# Patient Record
Sex: Male | Born: 1970 | Race: White | Hispanic: No | Marital: Married | Smoking: Former smoker
Health system: Southern US, Community
[De-identification: ages and names within clinical notes are randomized; demographics above are authoritative.]

## PROBLEM LIST (undated history)

## (undated) DIAGNOSIS — Z21 Asymptomatic human immunodeficiency virus [HIV] infection status: Secondary | ICD-10-CM

## (undated) DIAGNOSIS — B20 Human immunodeficiency virus [HIV] disease: Secondary | ICD-10-CM

## (undated) DIAGNOSIS — E781 Pure hyperglyceridemia: Secondary | ICD-10-CM

## (undated) DIAGNOSIS — I10 Essential (primary) hypertension: Secondary | ICD-10-CM

## (undated) DIAGNOSIS — IMO0002 Reserved for concepts with insufficient information to code with codable children: Secondary | ICD-10-CM

## (undated) DIAGNOSIS — E785 Hyperlipidemia, unspecified: Secondary | ICD-10-CM

## (undated) DIAGNOSIS — J329 Chronic sinusitis, unspecified: Secondary | ICD-10-CM

## (undated) DIAGNOSIS — F329 Major depressive disorder, single episode, unspecified: Secondary | ICD-10-CM

## (undated) DIAGNOSIS — A539 Syphilis, unspecified: Secondary | ICD-10-CM

## (undated) DIAGNOSIS — R7303 Prediabetes: Secondary | ICD-10-CM

## (undated) DIAGNOSIS — F3289 Other specified depressive episodes: Secondary | ICD-10-CM

## (undated) DIAGNOSIS — K219 Gastro-esophageal reflux disease without esophagitis: Secondary | ICD-10-CM

## (undated) HISTORY — DX: Syphilis, unspecified: A53.9

## (undated) HISTORY — DX: Gastro-esophageal reflux disease without esophagitis: K21.9

## (undated) HISTORY — DX: Asymptomatic human immunodeficiency virus (hiv) infection status: Z21

## (undated) HISTORY — DX: Pure hyperglyceridemia: E78.1

## (undated) HISTORY — DX: Reserved for concepts with insufficient information to code with codable children: IMO0002

## (undated) HISTORY — DX: Major depressive disorder, single episode, unspecified: F32.9

## (undated) HISTORY — DX: Hyperlipidemia, unspecified: E78.5

## (undated) HISTORY — DX: Essential (primary) hypertension: I10

## (undated) HISTORY — DX: Prediabetes: R73.03

## (undated) HISTORY — DX: Chronic sinusitis, unspecified: J32.9

## (undated) HISTORY — DX: Other specified depressive episodes: F32.89

## (undated) HISTORY — DX: Human immunodeficiency virus (HIV) disease: B20

---

## 1998-08-02 ENCOUNTER — Ambulatory Visit (HOSPITAL_BASED_OUTPATIENT_CLINIC_OR_DEPARTMENT_OTHER): Admission: RE | Admit: 1998-08-02 | Discharge: 1998-08-02 | Payer: Self-pay | Admitting: *Deleted

## 1999-01-26 ENCOUNTER — Ambulatory Visit (HOSPITAL_COMMUNITY): Admission: RE | Admit: 1999-01-26 | Discharge: 1999-01-26 | Payer: Self-pay | Admitting: Infectious Diseases

## 1999-01-26 ENCOUNTER — Encounter: Admission: RE | Admit: 1999-01-26 | Discharge: 1999-01-26 | Payer: Self-pay | Admitting: Infectious Diseases

## 1999-01-31 ENCOUNTER — Ambulatory Visit (HOSPITAL_COMMUNITY): Admission: RE | Admit: 1999-01-31 | Discharge: 1999-01-31 | Payer: Self-pay | Admitting: Infectious Diseases

## 1999-01-31 ENCOUNTER — Encounter: Payer: Self-pay | Admitting: Infectious Diseases

## 1999-02-21 ENCOUNTER — Encounter: Payer: Self-pay | Admitting: Infectious Diseases

## 1999-02-21 ENCOUNTER — Ambulatory Visit (HOSPITAL_COMMUNITY): Admission: RE | Admit: 1999-02-21 | Discharge: 1999-02-21 | Payer: Self-pay | Admitting: Infectious Diseases

## 1999-02-21 ENCOUNTER — Inpatient Hospital Stay (HOSPITAL_COMMUNITY): Admission: AD | Admit: 1999-02-21 | Discharge: 1999-02-23 | Payer: Self-pay | Admitting: Internal Medicine

## 1999-02-21 ENCOUNTER — Encounter: Admission: RE | Admit: 1999-02-21 | Discharge: 1999-02-21 | Payer: Self-pay | Admitting: Infectious Diseases

## 1999-02-21 ENCOUNTER — Encounter (INDEPENDENT_AMBULATORY_CARE_PROVIDER_SITE_OTHER): Payer: Self-pay | Admitting: *Deleted

## 1999-02-21 LAB — CONVERTED CEMR LAB: CD4 Count: 10 microliters

## 1999-02-28 ENCOUNTER — Encounter: Admission: RE | Admit: 1999-02-28 | Discharge: 1999-02-28 | Payer: Self-pay | Admitting: Infectious Diseases

## 1999-03-25 ENCOUNTER — Ambulatory Visit (HOSPITAL_COMMUNITY): Admission: RE | Admit: 1999-03-25 | Discharge: 1999-03-25 | Payer: Self-pay | Admitting: Internal Medicine

## 1999-03-25 ENCOUNTER — Encounter: Payer: Self-pay | Admitting: Internal Medicine

## 1999-03-25 ENCOUNTER — Encounter: Admission: RE | Admit: 1999-03-25 | Discharge: 1999-03-25 | Payer: Self-pay | Admitting: Internal Medicine

## 1999-03-29 ENCOUNTER — Encounter: Payer: Self-pay | Admitting: Internal Medicine

## 1999-03-29 ENCOUNTER — Inpatient Hospital Stay (HOSPITAL_COMMUNITY): Admission: AD | Admit: 1999-03-29 | Discharge: 1999-03-31 | Payer: Self-pay | Admitting: Internal Medicine

## 1999-03-29 ENCOUNTER — Encounter: Admission: RE | Admit: 1999-03-29 | Discharge: 1999-03-29 | Payer: Self-pay | Admitting: Internal Medicine

## 1999-03-30 ENCOUNTER — Encounter: Payer: Self-pay | Admitting: Infectious Diseases

## 1999-03-30 ENCOUNTER — Encounter: Payer: Self-pay | Admitting: Internal Medicine

## 1999-04-11 ENCOUNTER — Encounter: Admission: RE | Admit: 1999-04-11 | Discharge: 1999-04-11 | Payer: Self-pay | Admitting: Infectious Diseases

## 1999-04-25 ENCOUNTER — Encounter: Admission: RE | Admit: 1999-04-25 | Discharge: 1999-04-25 | Payer: Self-pay | Admitting: Infectious Diseases

## 1999-04-27 ENCOUNTER — Encounter: Admission: RE | Admit: 1999-04-27 | Discharge: 1999-04-27 | Payer: Self-pay | Admitting: Infectious Diseases

## 1999-05-02 ENCOUNTER — Encounter: Admission: RE | Admit: 1999-05-02 | Discharge: 1999-05-02 | Payer: Self-pay | Admitting: Infectious Diseases

## 1999-06-13 ENCOUNTER — Ambulatory Visit (HOSPITAL_COMMUNITY): Admission: RE | Admit: 1999-06-13 | Discharge: 1999-06-13 | Payer: Self-pay | Admitting: Infectious Diseases

## 1999-06-13 ENCOUNTER — Encounter: Admission: RE | Admit: 1999-06-13 | Discharge: 1999-06-13 | Payer: Self-pay | Admitting: Infectious Diseases

## 1999-07-04 ENCOUNTER — Encounter: Admission: RE | Admit: 1999-07-04 | Discharge: 1999-07-04 | Payer: Self-pay | Admitting: Infectious Diseases

## 1999-09-12 ENCOUNTER — Ambulatory Visit (HOSPITAL_COMMUNITY): Admission: RE | Admit: 1999-09-12 | Discharge: 1999-09-12 | Payer: Self-pay | Admitting: Infectious Diseases

## 1999-09-12 ENCOUNTER — Encounter: Admission: RE | Admit: 1999-09-12 | Discharge: 1999-09-12 | Payer: Self-pay | Admitting: Infectious Diseases

## 1999-09-26 ENCOUNTER — Encounter: Admission: RE | Admit: 1999-09-26 | Discharge: 1999-09-26 | Payer: Self-pay | Admitting: Infectious Diseases

## 1999-12-12 ENCOUNTER — Ambulatory Visit (HOSPITAL_COMMUNITY): Admission: RE | Admit: 1999-12-12 | Discharge: 1999-12-12 | Payer: Self-pay | Admitting: Infectious Diseases

## 1999-12-12 ENCOUNTER — Encounter: Admission: RE | Admit: 1999-12-12 | Discharge: 1999-12-12 | Payer: Self-pay | Admitting: Infectious Diseases

## 1999-12-26 ENCOUNTER — Encounter: Admission: RE | Admit: 1999-12-26 | Discharge: 1999-12-26 | Payer: Self-pay | Admitting: Infectious Diseases

## 2000-04-30 ENCOUNTER — Ambulatory Visit (HOSPITAL_COMMUNITY): Admission: RE | Admit: 2000-04-30 | Discharge: 2000-04-30 | Payer: Self-pay | Admitting: Infectious Diseases

## 2000-04-30 ENCOUNTER — Encounter: Admission: RE | Admit: 2000-04-30 | Discharge: 2000-04-30 | Payer: Self-pay | Admitting: Infectious Diseases

## 2000-05-14 ENCOUNTER — Encounter: Admission: RE | Admit: 2000-05-14 | Discharge: 2000-05-14 | Payer: Self-pay | Admitting: Infectious Diseases

## 2000-08-27 ENCOUNTER — Ambulatory Visit (HOSPITAL_COMMUNITY): Admission: RE | Admit: 2000-08-27 | Discharge: 2000-08-27 | Payer: Self-pay | Admitting: Infectious Diseases

## 2000-08-27 ENCOUNTER — Encounter: Admission: RE | Admit: 2000-08-27 | Discharge: 2000-08-27 | Payer: Self-pay | Admitting: Infectious Diseases

## 2000-09-10 ENCOUNTER — Encounter: Admission: RE | Admit: 2000-09-10 | Discharge: 2000-09-10 | Payer: Self-pay | Admitting: Infectious Diseases

## 2000-12-26 ENCOUNTER — Ambulatory Visit (HOSPITAL_COMMUNITY): Admission: RE | Admit: 2000-12-26 | Discharge: 2000-12-26 | Payer: Self-pay | Admitting: Infectious Diseases

## 2000-12-26 ENCOUNTER — Encounter: Admission: RE | Admit: 2000-12-26 | Discharge: 2000-12-26 | Payer: Self-pay | Admitting: Infectious Diseases

## 2001-01-14 ENCOUNTER — Encounter: Admission: RE | Admit: 2001-01-14 | Discharge: 2001-01-14 | Payer: Self-pay | Admitting: Infectious Diseases

## 2001-04-29 ENCOUNTER — Ambulatory Visit (HOSPITAL_COMMUNITY): Admission: RE | Admit: 2001-04-29 | Discharge: 2001-04-29 | Payer: Self-pay | Admitting: Infectious Diseases

## 2001-04-29 ENCOUNTER — Encounter: Admission: RE | Admit: 2001-04-29 | Discharge: 2001-04-29 | Payer: Self-pay | Admitting: Infectious Diseases

## 2001-05-29 ENCOUNTER — Encounter: Admission: RE | Admit: 2001-05-29 | Discharge: 2001-05-29 | Payer: Self-pay | Admitting: Infectious Diseases

## 2001-10-14 ENCOUNTER — Ambulatory Visit (HOSPITAL_COMMUNITY): Admission: RE | Admit: 2001-10-14 | Discharge: 2001-10-14 | Payer: Self-pay | Admitting: Infectious Diseases

## 2001-10-14 ENCOUNTER — Encounter: Admission: RE | Admit: 2001-10-14 | Discharge: 2001-10-14 | Payer: Self-pay | Admitting: Infectious Diseases

## 2001-11-11 ENCOUNTER — Encounter: Admission: RE | Admit: 2001-11-11 | Discharge: 2001-11-11 | Payer: Self-pay | Admitting: Infectious Diseases

## 2002-03-11 ENCOUNTER — Encounter: Admission: RE | Admit: 2002-03-11 | Discharge: 2002-03-11 | Payer: Self-pay | Admitting: Infectious Diseases

## 2002-03-11 ENCOUNTER — Ambulatory Visit (HOSPITAL_COMMUNITY): Admission: RE | Admit: 2002-03-11 | Discharge: 2002-03-11 | Payer: Self-pay | Admitting: Infectious Diseases

## 2002-03-11 ENCOUNTER — Encounter (INDEPENDENT_AMBULATORY_CARE_PROVIDER_SITE_OTHER): Payer: Self-pay | Admitting: Specialist

## 2002-03-31 ENCOUNTER — Encounter: Admission: RE | Admit: 2002-03-31 | Discharge: 2002-03-31 | Payer: Self-pay | Admitting: Infectious Diseases

## 2002-05-05 ENCOUNTER — Encounter: Admission: RE | Admit: 2002-05-05 | Discharge: 2002-05-05 | Payer: Self-pay | Admitting: Infectious Diseases

## 2002-09-01 ENCOUNTER — Encounter (INDEPENDENT_AMBULATORY_CARE_PROVIDER_SITE_OTHER): Payer: Self-pay | Admitting: Infectious Diseases

## 2002-09-01 ENCOUNTER — Encounter: Admission: RE | Admit: 2002-09-01 | Discharge: 2002-09-01 | Payer: Self-pay | Admitting: Infectious Diseases

## 2002-09-15 ENCOUNTER — Encounter: Admission: RE | Admit: 2002-09-15 | Discharge: 2002-09-15 | Payer: Self-pay | Admitting: Infectious Diseases

## 2002-12-15 ENCOUNTER — Encounter (INDEPENDENT_AMBULATORY_CARE_PROVIDER_SITE_OTHER): Payer: Self-pay | Admitting: Infectious Diseases

## 2002-12-15 ENCOUNTER — Ambulatory Visit (HOSPITAL_COMMUNITY): Admission: RE | Admit: 2002-12-15 | Discharge: 2002-12-15 | Payer: Self-pay | Admitting: Infectious Diseases

## 2002-12-15 ENCOUNTER — Encounter: Admission: RE | Admit: 2002-12-15 | Discharge: 2002-12-15 | Payer: Self-pay | Admitting: Infectious Diseases

## 2002-12-29 ENCOUNTER — Encounter: Admission: RE | Admit: 2002-12-29 | Discharge: 2002-12-29 | Payer: Self-pay | Admitting: Infectious Diseases

## 2003-04-16 ENCOUNTER — Encounter: Admission: RE | Admit: 2003-04-16 | Discharge: 2003-04-16 | Payer: Self-pay | Admitting: Infectious Diseases

## 2003-05-13 ENCOUNTER — Encounter: Admission: RE | Admit: 2003-05-13 | Discharge: 2003-05-13 | Payer: Self-pay | Admitting: Infectious Diseases

## 2003-09-10 ENCOUNTER — Ambulatory Visit (HOSPITAL_COMMUNITY): Admission: RE | Admit: 2003-09-10 | Discharge: 2003-09-10 | Payer: Self-pay | Admitting: Infectious Diseases

## 2003-09-10 ENCOUNTER — Encounter: Admission: RE | Admit: 2003-09-10 | Discharge: 2003-09-10 | Payer: Self-pay | Admitting: Infectious Diseases

## 2003-09-30 ENCOUNTER — Encounter: Admission: RE | Admit: 2003-09-30 | Discharge: 2003-09-30 | Payer: Self-pay | Admitting: Infectious Diseases

## 2004-01-26 ENCOUNTER — Ambulatory Visit (HOSPITAL_COMMUNITY): Admission: RE | Admit: 2004-01-26 | Discharge: 2004-01-26 | Payer: Self-pay | Admitting: Infectious Diseases

## 2004-01-26 ENCOUNTER — Ambulatory Visit: Payer: Self-pay | Admitting: Infectious Diseases

## 2004-02-15 ENCOUNTER — Ambulatory Visit: Payer: Self-pay | Admitting: Infectious Diseases

## 2004-03-11 ENCOUNTER — Ambulatory Visit (HOSPITAL_COMMUNITY): Admission: RE | Admit: 2004-03-11 | Discharge: 2004-03-11 | Payer: Self-pay | Admitting: Infectious Diseases

## 2004-03-14 ENCOUNTER — Ambulatory Visit: Payer: Self-pay | Admitting: Infectious Diseases

## 2004-04-22 ENCOUNTER — Emergency Department (HOSPITAL_COMMUNITY): Admission: EM | Admit: 2004-04-22 | Discharge: 2004-04-22 | Payer: Self-pay | Admitting: Family Medicine

## 2004-05-25 ENCOUNTER — Ambulatory Visit: Payer: Self-pay | Admitting: Infectious Diseases

## 2004-05-25 ENCOUNTER — Ambulatory Visit (HOSPITAL_COMMUNITY): Admission: RE | Admit: 2004-05-25 | Discharge: 2004-05-25 | Payer: Self-pay | Admitting: Infectious Diseases

## 2004-05-30 ENCOUNTER — Ambulatory Visit: Payer: Self-pay | Admitting: Infectious Diseases

## 2004-06-20 ENCOUNTER — Ambulatory Visit: Payer: Self-pay | Admitting: Infectious Diseases

## 2004-10-10 ENCOUNTER — Ambulatory Visit (HOSPITAL_COMMUNITY): Admission: RE | Admit: 2004-10-10 | Discharge: 2004-10-10 | Payer: Self-pay | Admitting: Infectious Diseases

## 2004-10-10 ENCOUNTER — Ambulatory Visit: Payer: Self-pay | Admitting: Infectious Diseases

## 2004-10-24 ENCOUNTER — Ambulatory Visit: Payer: Self-pay | Admitting: Infectious Diseases

## 2005-02-06 ENCOUNTER — Ambulatory Visit (HOSPITAL_COMMUNITY): Admission: RE | Admit: 2005-02-06 | Discharge: 2005-02-06 | Payer: Self-pay | Admitting: Infectious Diseases

## 2005-02-06 ENCOUNTER — Ambulatory Visit: Payer: Self-pay | Admitting: Infectious Diseases

## 2005-02-20 ENCOUNTER — Ambulatory Visit: Payer: Self-pay | Admitting: Infectious Diseases

## 2005-04-04 ENCOUNTER — Emergency Department (HOSPITAL_COMMUNITY): Admission: EM | Admit: 2005-04-04 | Discharge: 2005-04-04 | Payer: Self-pay | Admitting: Emergency Medicine

## 2005-05-15 ENCOUNTER — Ambulatory Visit: Payer: Self-pay | Admitting: Infectious Diseases

## 2005-05-29 ENCOUNTER — Ambulatory Visit: Payer: Self-pay | Admitting: Infectious Diseases

## 2005-10-05 ENCOUNTER — Encounter (INDEPENDENT_AMBULATORY_CARE_PROVIDER_SITE_OTHER): Payer: Self-pay | Admitting: *Deleted

## 2005-10-05 ENCOUNTER — Ambulatory Visit: Payer: Self-pay | Admitting: Infectious Diseases

## 2005-10-05 ENCOUNTER — Encounter: Admission: RE | Admit: 2005-10-05 | Discharge: 2005-10-05 | Payer: Self-pay | Admitting: Infectious Diseases

## 2005-10-23 ENCOUNTER — Ambulatory Visit: Payer: Self-pay | Admitting: Infectious Diseases

## 2006-02-05 ENCOUNTER — Encounter (INDEPENDENT_AMBULATORY_CARE_PROVIDER_SITE_OTHER): Payer: Self-pay | Admitting: *Deleted

## 2006-02-05 ENCOUNTER — Ambulatory Visit: Payer: Self-pay | Admitting: Infectious Diseases

## 2006-02-05 ENCOUNTER — Encounter: Admission: RE | Admit: 2006-02-05 | Discharge: 2006-02-05 | Payer: Self-pay | Admitting: Infectious Diseases

## 2006-02-05 LAB — CONVERTED CEMR LAB
ALT: 36 units/L (ref 0–53)
AST: 30 units/L (ref 0–37)
Albumin: 4.4 g/dL (ref 3.5–5.2)
Alkaline Phosphatase: 67 units/L (ref 39–117)
BUN: 16 mg/dL (ref 6–23)
Basophils Absolute: 0 10*3/uL (ref 0.0–0.1)
Basophils Relative: 0 % (ref 0–1)
CD4 Count: 300 microliters
CO2: 28 meq/L (ref 19–32)
Calcium: 9.9 mg/dL (ref 8.4–10.5)
Chloride: 103 meq/L (ref 96–112)
Cholesterol: 150 mg/dL (ref 0–200)
Creatinine, Ser: 0.8 mg/dL (ref 0.40–1.50)
Eosinophils Relative: 2 % (ref 0–4)
Glucose, Bld: 108 mg/dL — ABNORMAL HIGH (ref 70–99)
HCT: 48.6 % (ref 41.0–49.0)
HDL: 26 mg/dL — ABNORMAL LOW (ref >39)
HIV 1 RNA Quant: 49 copies/mL
HIV 1 RNA Quant: <50 copies/mL (ref <50)
HIV-1 RNA Quant, Log: <1.7 (ref <1.70)
Hemoglobin: 16.4 g/dL (ref 13.9–16.8)
Lymphocytes Relative: 33 % (ref 15–43)
Lymphs Abs: 1.9 10*3/uL (ref 0.8–3.1)
MCHC: 33.7 g/dL (ref 33.1–35.4)
MCV: 90.2 fL (ref 78.8–100.0)
Monocytes Absolute: 0.4 10*3/uL (ref 0.2–0.7)
Monocytes Relative: 7 % (ref 3–11)
Neutro Abs: 3.3 10*3/uL (ref 1.8–6.8)
Neutrophils Relative %: 57 % (ref 47–77)
Platelets: 200 10*3/uL (ref 152–374)
Potassium: 4.3 meq/L (ref 3.5–5.3)
RBC: 5.39 M/uL (ref 4.20–5.50)
RDW: 13.9 % (ref 11.5–15.3)
Sodium: 140 meq/L (ref 135–145)
Total Bilirubin: 0.3 mg/dL (ref 0.3–1.2)
Total CHOL/HDL Ratio: 5.8
Total Protein: 7.4 g/dL (ref 6.0–8.3)
Triglycerides: 492 mg/dL — ABNORMAL HIGH (ref <150)
WBC: 5.8 10*3/uL (ref 3.7–10.0)

## 2006-02-19 ENCOUNTER — Ambulatory Visit: Payer: Self-pay | Admitting: Infectious Diseases

## 2006-04-01 ENCOUNTER — Emergency Department (HOSPITAL_COMMUNITY): Admission: EM | Admit: 2006-04-01 | Discharge: 2006-04-01 | Payer: Self-pay | Admitting: Family Medicine

## 2006-05-14 ENCOUNTER — Ambulatory Visit: Payer: Self-pay | Admitting: Infectious Diseases

## 2006-05-14 ENCOUNTER — Encounter (INDEPENDENT_AMBULATORY_CARE_PROVIDER_SITE_OTHER): Payer: Self-pay | Admitting: *Deleted

## 2006-05-14 ENCOUNTER — Encounter: Admission: RE | Admit: 2006-05-14 | Discharge: 2006-05-14 | Payer: Self-pay | Admitting: Infectious Diseases

## 2006-05-14 LAB — CONVERTED CEMR LAB: CD4 Count: 320 microliters

## 2006-05-21 ENCOUNTER — Encounter (INDEPENDENT_AMBULATORY_CARE_PROVIDER_SITE_OTHER): Payer: Self-pay | Admitting: *Deleted

## 2006-05-21 LAB — CONVERTED CEMR LAB

## 2006-05-29 DIAGNOSIS — F17200 Nicotine dependence, unspecified, uncomplicated: Secondary | ICD-10-CM

## 2006-05-29 DIAGNOSIS — B20 Human immunodeficiency virus [HIV] disease: Secondary | ICD-10-CM | POA: Insufficient documentation

## 2006-05-29 DIAGNOSIS — A539 Syphilis, unspecified: Secondary | ICD-10-CM

## 2006-06-03 ENCOUNTER — Encounter (INDEPENDENT_AMBULATORY_CARE_PROVIDER_SITE_OTHER): Payer: Self-pay | Admitting: *Deleted

## 2006-06-04 ENCOUNTER — Ambulatory Visit: Payer: Self-pay | Admitting: Infectious Diseases

## 2006-06-04 DIAGNOSIS — E781 Pure hyperglyceridemia: Secondary | ICD-10-CM

## 2006-10-16 ENCOUNTER — Ambulatory Visit: Payer: Self-pay | Admitting: Infectious Disease

## 2006-10-16 ENCOUNTER — Encounter: Admission: RE | Admit: 2006-10-16 | Discharge: 2006-10-16 | Payer: Self-pay | Admitting: Infectious Disease

## 2006-10-29 ENCOUNTER — Ambulatory Visit: Payer: Self-pay | Admitting: Infectious Disease

## 2006-10-29 DIAGNOSIS — I10 Essential (primary) hypertension: Secondary | ICD-10-CM | POA: Insufficient documentation

## 2007-01-28 ENCOUNTER — Telehealth: Payer: Self-pay | Admitting: Infectious Disease

## 2007-02-11 ENCOUNTER — Encounter: Admission: RE | Admit: 2007-02-11 | Discharge: 2007-02-11 | Payer: Self-pay | Admitting: Infectious Disease

## 2007-02-11 ENCOUNTER — Ambulatory Visit: Payer: Self-pay | Admitting: Infectious Disease

## 2007-02-11 LAB — CONVERTED CEMR LAB
ALT: 37 units/L (ref 0–53)
BUN: 11 mg/dL (ref 6–23)
Basophils Absolute: 0 10*3/uL (ref 0.0–0.1)
CO2: 29 meq/L (ref 19–32)
Calcium: 9.5 mg/dL (ref 8.4–10.5)
Chloride: 104 meq/L (ref 96–112)
Cholesterol: 140 mg/dL (ref 0–200)
Creatinine, Ser: 0.81 mg/dL (ref 0.40–1.50)
Glucose, Bld: 101 mg/dL — ABNORMAL HIGH (ref 70–99)
HCT: 47.7 % (ref 39.0–52.0)
HIV-1 RNA Quant, Log: <1.7 (ref <1.70)
Hemoglobin: 16.1 g/dL (ref 13.0–17.0)
Lymphocytes Relative: 30 % (ref 12–46)
Lymphs Abs: 1.6 10*3/uL (ref 0.7–4.0)
Monocytes Absolute: 0.5 10*3/uL (ref 0.1–1.0)
Monocytes Relative: 9 % (ref 3–12)
Neutro Abs: 3.1 10*3/uL (ref 1.7–7.7)
RBC: 5.2 M/uL (ref 4.22–5.81)
Total CHOL/HDL Ratio: 4.8
Triglycerides: 320 mg/dL — ABNORMAL HIGH (ref <150)

## 2007-02-26 ENCOUNTER — Ambulatory Visit: Payer: Self-pay | Admitting: Infectious Disease

## 2007-02-26 DIAGNOSIS — F329 Major depressive disorder, single episode, unspecified: Secondary | ICD-10-CM

## 2007-04-08 ENCOUNTER — Emergency Department (HOSPITAL_COMMUNITY): Admission: EM | Admit: 2007-04-08 | Discharge: 2007-04-08 | Payer: Self-pay | Admitting: Family Medicine

## 2007-09-02 ENCOUNTER — Encounter: Admission: RE | Admit: 2007-09-02 | Discharge: 2007-09-02 | Payer: Self-pay | Admitting: Infectious Disease

## 2007-09-02 ENCOUNTER — Ambulatory Visit: Payer: Self-pay | Admitting: Infectious Disease

## 2007-09-02 LAB — CONVERTED CEMR LAB
ALT: 31 units/L (ref 0–53)
AST: 26 units/L (ref 0–37)
Albumin: 4.4 g/dL (ref 3.5–5.2)
Alkaline Phosphatase: 52 units/L (ref 39–117)
BUN: 14 mg/dL (ref 6–23)
Basophils Absolute: 0 10*3/uL (ref 0.0–0.1)
Basophils Relative: 0 % (ref 0–1)
CO2: 23 meq/L (ref 19–32)
Calcium: 9.4 mg/dL (ref 8.4–10.5)
Chloride: 107 meq/L (ref 96–112)
Cholesterol: 178 mg/dL (ref 0–200)
Creatinine, Ser: 0.92 mg/dL (ref 0.40–1.50)
Eosinophils Absolute: 0.1 10*3/uL (ref 0.0–0.7)
Eosinophils Relative: 2 % (ref 0–5)
Glucose, Bld: 97 mg/dL (ref 70–99)
HCT: 44 % (ref 39.0–52.0)
HDL: 31 mg/dL — ABNORMAL LOW (ref >39)
HIV 1 RNA Quant: 60 copies/mL — ABNORMAL HIGH (ref <50)
HIV-1 RNA Quant, Log: 1.78 — ABNORMAL HIGH (ref <1.70)
Hemoglobin: 15.2 g/dL (ref 13.0–17.0)
LDL Cholesterol: 111 mg/dL — ABNORMAL HIGH (ref 0–99)
Lymphocytes Relative: 31 % (ref 12–46)
Lymphs Abs: 1.7 10*3/uL (ref 0.7–4.0)
MCHC: 34.5 g/dL (ref 30.0–36.0)
MCV: 90.5 fL (ref 78.0–100.0)
Monocytes Absolute: 0.4 10*3/uL (ref 0.1–1.0)
Monocytes Relative: 7 % (ref 3–12)
Neutro Abs: 3.4 10*3/uL (ref 1.7–7.7)
Neutrophils Relative %: 60 % (ref 43–77)
Platelets: 222 10*3/uL (ref 150–400)
Potassium: 3.9 meq/L (ref 3.5–5.3)
RBC: 4.86 M/uL (ref 4.22–5.81)
RDW: 13.9 % (ref 11.5–15.5)
Sodium: 141 meq/L (ref 135–145)
Total Bilirubin: 0.3 mg/dL (ref 0.3–1.2)
Total CHOL/HDL Ratio: 5.7
Total Protein: 6.9 g/dL (ref 6.0–8.3)
Triglycerides: 180 mg/dL — ABNORMAL HIGH (ref <150)
VLDL: 36 mg/dL (ref 0–40)
WBC: 5.7 10*3/uL (ref 4.0–10.5)

## 2007-09-16 ENCOUNTER — Ambulatory Visit: Payer: Self-pay | Admitting: Infectious Disease

## 2008-04-06 ENCOUNTER — Ambulatory Visit: Payer: Self-pay | Admitting: Infectious Disease

## 2008-04-06 ENCOUNTER — Encounter (INDEPENDENT_AMBULATORY_CARE_PROVIDER_SITE_OTHER): Payer: Self-pay | Admitting: Licensed Clinical Social Worker

## 2008-04-06 LAB — CONVERTED CEMR LAB
Albumin: 4.5 g/dL (ref 3.5–5.2)
Alkaline Phosphatase: 52 units/L (ref 39–117)
BUN: 13 mg/dL (ref 6–23)
CO2: 24 meq/L (ref 19–32)
Eosinophils Absolute: 0.1 10*3/uL (ref 0.0–0.7)
Eosinophils Relative: 2 % (ref 0–5)
Glucose, Bld: 105 mg/dL — ABNORMAL HIGH (ref 70–99)
HCT: 45.9 % (ref 39.0–52.0)
HIV 1 RNA Quant: 141 copies/mL — ABNORMAL HIGH (ref <48)
HIV-1 RNA Quant, Log: 2.15 — ABNORMAL HIGH (ref <1.68)
Lymphs Abs: 1.7 10*3/uL (ref 0.7–4.0)
MCV: 88.6 fL (ref 78.0–100.0)
Monocytes Relative: 8 % (ref 3–12)
Neutrophils Relative %: 57 % (ref 43–77)
Potassium: 4.5 meq/L (ref 3.5–5.3)
RBC: 5.18 M/uL (ref 4.22–5.81)
WBC: 5.1 10*3/uL (ref 4.0–10.5)

## 2008-04-20 ENCOUNTER — Ambulatory Visit: Payer: Self-pay | Admitting: Infectious Disease

## 2008-07-21 ENCOUNTER — Encounter: Payer: Self-pay | Admitting: Infectious Disease

## 2008-07-21 ENCOUNTER — Ambulatory Visit: Payer: Self-pay | Admitting: Infectious Diseases

## 2008-07-21 LAB — CONVERTED CEMR LAB
ALT: 32 units/L (ref 0–53)
AST: 33 units/L (ref 0–37)
Alkaline Phosphatase: 56 units/L (ref 39–117)
Basophils Relative: 0 % (ref 0–1)
Calcium: 9.9 mg/dL (ref 8.4–10.5)
Chloride: 104 meq/L (ref 96–112)
Creatinine, Ser: 0.89 mg/dL (ref 0.40–1.50)
Eosinophils Absolute: 0.2 10*3/uL (ref 0.0–0.7)
Eosinophils Relative: 2 % (ref 0–5)
HCT: 46.3 % (ref 39.0–52.0)
HDL: 32 mg/dL — ABNORMAL LOW (ref >39)
HIV 1 RNA Quant: 142 copies/mL — ABNORMAL HIGH (ref <48)
HIV-1 RNA Quant, Log: 2.15 — ABNORMAL HIGH (ref <1.68)
Hemoglobin: 16.1 g/dL (ref 13.0–17.0)
Lymphs Abs: 2.3 10*3/uL (ref 0.7–4.0)
MCHC: 34.8 g/dL (ref 30.0–36.0)
MCV: 89.7 fL (ref 78.0–100.0)
Monocytes Absolute: 0.5 10*3/uL (ref 0.1–1.0)
Monocytes Relative: 7 % (ref 3–12)
RBC: 5.16 M/uL (ref 4.22–5.81)
Total Bilirubin: 0.3 mg/dL (ref 0.3–1.2)
Total CHOL/HDL Ratio: 5.6
VLDL: 45 mg/dL — ABNORMAL HIGH (ref 0–40)
WBC: 6.3 10*3/uL (ref 4.0–10.5)

## 2008-08-05 ENCOUNTER — Ambulatory Visit: Payer: Self-pay | Admitting: Infectious Diseases

## 2008-11-09 ENCOUNTER — Telehealth: Payer: Self-pay | Admitting: Infectious Diseases

## 2008-12-29 ENCOUNTER — Ambulatory Visit: Payer: Self-pay | Admitting: Infectious Diseases

## 2008-12-29 LAB — CONVERTED CEMR LAB
ALT: 31 units/L (ref 0–53)
CO2: 26 meq/L (ref 19–32)
Calcium: 10 mg/dL (ref 8.4–10.5)
Chloride: 102 meq/L (ref 96–112)
Creatinine, Ser: 0.95 mg/dL (ref 0.40–1.50)
Eosinophils Absolute: 0.1 10*3/uL (ref 0.0–0.7)
Glucose, Bld: 94 mg/dL (ref 70–99)
Lymphocytes Relative: 32 % (ref 12–46)
Lymphs Abs: 2 10*3/uL (ref 0.7–4.0)
MCV: 91.7 fL (ref >=78.0)
Monocytes Relative: 8 % (ref 3–12)
Neutrophils Relative %: 57 % (ref 43–77)
Platelets: 232 10*3/uL (ref 150–400)
RBC: 5.2 M/uL (ref 4.22–5.81)
Total Bilirubin: 0.4 mg/dL (ref 0.3–1.2)
Total Protein: 7.4 g/dL (ref 6.0–8.3)
WBC: 6.1 10*3/uL (ref 4.0–10.5)

## 2009-01-19 ENCOUNTER — Ambulatory Visit: Payer: Self-pay | Admitting: Infectious Diseases

## 2009-04-28 ENCOUNTER — Telehealth (INDEPENDENT_AMBULATORY_CARE_PROVIDER_SITE_OTHER): Payer: Self-pay | Admitting: *Deleted

## 2009-06-02 ENCOUNTER — Ambulatory Visit: Payer: Self-pay | Admitting: Infectious Diseases

## 2009-06-02 LAB — CONVERTED CEMR LAB
AST: 29 units/L (ref 0–37)
BUN: 18 mg/dL (ref 6–23)
Basophils Relative: 0 % (ref 0–1)
CO2: 25 meq/L (ref 19–32)
Calcium: 9.3 mg/dL (ref 8.4–10.5)
Chloride: 101 meq/L (ref 96–112)
Cholesterol: 196 mg/dL (ref 0–200)
Creatinine, Ser: 0.84 mg/dL (ref 0.40–1.50)
Eosinophils Absolute: 0.1 10*3/uL (ref 0.0–0.7)
Eosinophils Relative: 3 % (ref 0–5)
HCT: 47.7 % (ref 39.0–52.0)
HDL: 29 mg/dL — ABNORMAL LOW (ref >39)
Lymphs Abs: 1.4 10*3/uL (ref 0.7–4.0)
MCHC: 33.3 g/dL (ref 30.0–36.0)
MCV: 89.8 fL (ref >=78.0)
Platelets: 241 10*3/uL (ref 150–400)
Total Bilirubin: 0.3 mg/dL (ref 0.3–1.2)
Total CHOL/HDL Ratio: 6.8
WBC: 5.7 10*3/uL (ref 4.0–10.5)

## 2009-06-17 ENCOUNTER — Ambulatory Visit: Payer: Self-pay | Admitting: Infectious Diseases

## 2009-06-17 DIAGNOSIS — IMO0002 Reserved for concepts with insufficient information to code with codable children: Secondary | ICD-10-CM | POA: Insufficient documentation

## 2009-12-07 ENCOUNTER — Ambulatory Visit: Payer: Self-pay | Admitting: Infectious Diseases

## 2009-12-07 LAB — CONVERTED CEMR LAB
AST: 30 units/L (ref 0–37)
Alkaline Phosphatase: 52 units/L (ref 39–117)
BUN: 13 mg/dL (ref 6–23)
Basophils Relative: 0 % (ref 0–1)
Calcium: 9.3 mg/dL (ref 8.4–10.5)
Chloride: 105 meq/L (ref 96–112)
Creatinine, Ser: 0.87 mg/dL (ref 0.40–1.50)
Eosinophils Absolute: 0.1 10*3/uL (ref 0.0–0.7)
Eosinophils Relative: 2 % (ref 0–5)
HCT: 46.4 % (ref 39.0–52.0)
Hemoglobin: 15.3 g/dL (ref 13.0–17.0)
MCHC: 33 g/dL (ref 30.0–36.0)
MCV: 91.9 fL (ref 78.0–100.0)
Monocytes Absolute: 0.5 10*3/uL (ref 0.1–1.0)
Monocytes Relative: 8 % (ref 3–12)
RBC: 5.05 M/uL (ref 4.22–5.81)

## 2009-12-22 ENCOUNTER — Ambulatory Visit: Payer: Self-pay | Admitting: Infectious Diseases

## 2010-02-15 ENCOUNTER — Encounter (INDEPENDENT_AMBULATORY_CARE_PROVIDER_SITE_OTHER): Payer: Self-pay | Admitting: *Deleted

## 2010-04-24 LAB — CONVERTED CEMR LAB
ALT: 41 units/L (ref 0–53)
AST: 29 units/L (ref 0–37)
Albumin: 4.4 g/dL (ref 3.5–5.2)
Albumin: 4.6 g/dL (ref 3.5–5.2)
Alkaline Phosphatase: 65 units/L (ref 39–117)
Basophils Absolute: 0 10*3/uL (ref 0.0–0.1)
Basophils Relative: 0 % (ref 0–1)
Bilirubin Urine: NEGATIVE
CO2: 30 meq/L (ref 19–32)
Calcium: 9.2 mg/dL (ref 8.4–10.5)
Calcium: 9.9 mg/dL (ref 8.4–10.5)
Chloride: 104 meq/L (ref 96–112)
Chloride: 104 meq/L (ref 96–112)
Cholesterol: 161 mg/dL (ref 0–200)
Eosinophils Absolute: 0.1 10*3/uL (ref 0.0–0.7)
Eosinophils Relative: 3 % (ref 0–5)
Glucose, Bld: 104 mg/dL — ABNORMAL HIGH (ref 70–99)
Glucose, Bld: 99 mg/dL (ref 70–99)
HCT: 50.2 % (ref 39.0–52.0)
HIV 1 RNA Quant: <50 copies/mL (ref <50)
HIV-1 RNA Quant, Log: <1.7 (ref <1.70)
Hemoglobin: 15.9 g/dL (ref 13.0–17.0)
Leukocytes, UA: NEGATIVE
Lymphocytes Relative: 32 % (ref 12–46)
Lymphs Abs: 1.7 10*3/uL (ref 0.7–3.3)
Lymphs Abs: 2 10*3/uL (ref 0.7–3.3)
MCV: 92 fL (ref 78.0–100.0)
Monocytes Absolute: 0.4 10*3/uL (ref 0.2–0.7)
Neutro Abs: 2.9 10*3/uL (ref 1.7–7.7)
Neutro Abs: 3 10*3/uL (ref 1.7–7.7)
Neutrophils Relative %: 52 % (ref 43–77)
Platelets: 209 10*3/uL (ref 150–400)
Platelets: 215 10*3/uL (ref 150–400)
Potassium: 4.5 meq/L (ref 3.5–5.3)
Protein, ur: NEGATIVE mg/dL
RBC: 5.35 M/uL (ref 4.22–5.81)
Sodium: 141 meq/L (ref 135–145)
Sodium: 142 meq/L (ref 135–145)
Specific Gravity, Urine: 1.022 (ref 1.005–1.03)
Total Bilirubin: 0.4 mg/dL (ref 0.3–1.2)
Total Protein: 7 g/dL (ref 6.0–8.3)
Total Protein: 7.3 g/dL (ref 6.0–8.3)
Triglycerides: 420 mg/dL — ABNORMAL HIGH (ref <150)
Urine Glucose: NEGATIVE mg/dL
WBC: 5.3 10*3/uL (ref 4.0–10.5)
WBC: 5.5 10*3/uL (ref 4.0–10.5)
pH: 6 (ref 5.0–8.0)

## 2010-04-26 NOTE — Progress Notes (Signed)
Summary: Prior authorization for Atripla & Fenofibrate  Phone Note Call from Patient   Caller: Patient Details for Reason: Prior authorization Summary of Call: Patient called to obtain fax number so he can fax over a form to get his Atripla and Fenofibrate approved through his insurance company for a 90-day supply. He asked that I complete form and fax to [facility]. Initial call taken by: Paulo Fruit  BS,CPht II,MPH,  April 28, 2009 10:54 AM

## 2010-04-26 NOTE — Miscellaneous (Signed)
  Clinical Lists Changes  Observations: Added new observation of YEARAIDSPOS: 2000  (02/15/2010 15:33)

## 2010-04-26 NOTE — Assessment & Plan Note (Signed)
Summary: TRANFER FROM DR FITZGERALD/VS   Primary Provider:  [name], John Farrell  CC:  follow-up visit.  History of Present Illness: 40 yo with  HIV, hypertriglyceredemia, mild depression.  Near 100% adherence.  No side effects.  CD4 400 and VL <20 (12-07-09). has been doing well, recently returned from vacation in [place]. Prev paraesthesisas in L side of chest and L arm have resolved.   Preventive Screening-Counseling & Management  Alcohol-Tobacco     Alcohol drinks/day: 1 glass at dinner     Alcohol type: wine     Smoking Status: quit     Smoking Cessation Counseling: yes     Year Quit: 2009     Passive Smoke Exposure: no  Caffeine-Diet-Exercise     Caffeine use/day: 1 16oz. soda per day     Does Patient Exercise: yes     Type of exercise: walking     Exercise (avg: min/session): 30-60     Times/week: 3  Safety-Violence-Falls     Seat Belt Use: yes   Updated Prior Medication List: ATRIPLA 600-200-300 MG TABS (EFAVIRENZ-EMTRICITAB-TENOFOVIR) at bedtime FENOFIBRATE 54 MG TABS (FENOFIBRATE) one by mouth once daily  Current Allergies (reviewed today): No known allergies  Past History:  Past medical, surgical, family and social histories (including risk factors) reviewed, and no changes noted (except as noted below).  Past Medical History: Reviewed history from 08/05/2008 and no changes required. HIV disease  - dxed 2001 -  dxed when he had a PNA Syphilis, 3/06 Cigarette smoking Hyperlipidemia Depression  Family History: Reviewed history from 06/17/2009 and no changes required. Dad with MI in 29s.   No hx of colon cancer  Social History: Reviewed history from 02/26/2007 and no changes required. Domestic Partner Alcohol use-yes Drug use-no Regular exercise-yes Former Smoker  Review of Systems       wt down 5#,  has been more intentional to diet.  Vital Signs:  Patient profile:   40 year old male Height:      71 inches (180.34 cm) Weight:      179.0  pounds (81.36 kg) BMI:     25.06 Temp:     98.0 degrees F (36.67 degrees C) oral Pulse rate:   86 / minute BP sitting:   135 / 78  (right arm)  Vitals Entered By: Baxter Hire) (December 22, 2009 10:07 AM) CC: follow-up visit Pain Assessment Patient in pain? no      Nutritional Status BMI of 25 - 29 = overweight Nutritional Status Detail appetite is good per patient  Have you ever been in a relationship where you felt threatened, hurt or afraid?No   Does patient need assistance? Functional Status Self care Ambulation Normal   Physical Exam  General:  well-developed, well-nourished, and well-hydrated.   Eyes:  pupils equal, pupils round, and pupils reactive to light.   Mouth:  pharynx pink and moist and no exudates.   Neck:  no masses.   Lungs:  normal respiratory effort and normal breath sounds.   Heart:  normal rate, regular rhythm, and no murmur.   Abdomen:  soft, non-tender, and normal bowel sounds.          Medication Adherence: 12/22/2009   Adherence to medications reviewed with patient. Counseling to provide adequate adherence provided   Prevention For Positives: 12/22/2009   Safe sex practices discussed with patient. Condoms offered.  Impression & Recommendations:  Problem # 1:  HIV DISEASE (ICD-042) he is doing very well. will cont his current rx, flu shot today, offered condoms. will f/u in 6 months with labs prior.   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 Will repeat at next visit. fibrate refilled.   His updated medication list for this problem includes:    Fenofibrate 54 Mg Tabs (Fenofibrate) ..... One by mouth once daily  Medications Added to Medication List This Visit: 1)  Atripla 600-200-300 Mg Tabs (Efavirenz-emtricitab-tenofovir) .... Take 1 tablet by mouth once a day at bed time  Other Orders: Est.  Patient Level IV (04540) Future Orders: T-CD4SP ([facility]) (CD4SP) ... 06/20/2010 T-HIV Viral Load [phone]) ... 06/20/2010 T-Comprehensive Metabolic Panel [id number]) ... 06/20/2010 T-CBC w/Diff ([id number]) ... 06/20/2010 T-RPR (Syphilis) [phone]) ... 06/20/2010 T-Lipid Profile [phone]) ... 06/20/2010  Prescriptions: FENOFIBRATE 54 MG TABS (FENOFIBRATE) one by mouth once daily  #90 x 3   Entered and Authorized by:   John Sax MD   Signed by:   John Sax MD on 12/22/2009   Method used:   Faxed to ...       [facility]--mail order pharmacy (mail-order)             , [place]         Ph: [phone]       Fax: [phone]   RxID:   [id number] ATRIPLA 600-200-300 MG TABS (EFAVIRENZ-EMTRICITAB-TENOFOVIR) Take 1 tablet by mouth once a day at bed time  #90 x 3   Entered and Authorized by:   John Sax MD   Signed by:   John Sax MD on 12/22/2009   Method used:   Faxed to ...       [facility]--mail order pharmacy (mail-order)             , [place]         Ph: [phone]       Fax: [phone]   RxID:   [id number]   Appended Document: Orders Update    Clinical Lists Changes  Orders: Added new Service order of Influenza Vaccine NON MCR (10626) - Signed Observations: Added new observation of FLU VAX#1VIS: 10/19/09 version given December 22, 2009. (12/22/2009 12:58) Added new observation of FLU VAXLOT: 11033P (12/22/2009 12:58) Added new observation of FLU VAX EXP: 06/26/2010 (12/22/2009 12:58) Added new observation of FLU VAXBY: Kathi Simpers [facility]) (12/22/2009 12:58) Added new observation of FLU VAXRTE: IM (12/22/2009 12:58) Added new observation of FLU VAX DSE: 0.5 ml (12/22/2009 12:58) Added new observation of FLU VAXMFR: [employer]is (12/22/2009 12:58) Added new observation of FLU VAX SITE: right deltoid (12/22/2009 12:58) Added new observation of FLU VAX: Fluvax Non-MCR (12/22/2009 12:58)       Influenza Vaccine    Vaccine Type:  Fluvax Non-MCR    Site: right deltoid    Mfr: Novartis    Dose: 0.5 ml    Route: IM    Given by: Kathi Simpers CMA(AAMA)    Exp. Date: 06/26/2010    Lot #: [id number]    VIS given: 10/19/09 version given December 22, 2009.  Flu Vaccine Consent Questions    Do you have a history of severe allergic reactions to this vaccine? no    Any prior history of allergic reactions to egg and/or gelatin? no    Do you have a sensitivity to the preservative Thimersol? no    Do you  have a past history of Guillan-Barre Syndrome? no    Do you currently have an acute febrile illness? no    Have you ever had a severe reaction to latex? no    Vaccine information given and explained to patient? yes

## 2010-04-26 NOTE — Assessment & Plan Note (Signed)
Summary: f/u /dde   Primary Provider:  Daiva Eves, Remi Haggard  CC:  follow-up visit / pain in shoulder and neck only happens when arm gets in an odd position.  History of Present Illness: 40 yo with well controlled HIV, hypertriglyceredemia, mild depression. Doing well since last seen oCTOBER 2010. Near 100% adherence.  No side effects.   Currently c/o of uncomfortable pain on L side of body.  Pinching like feeling starts in armpit and also forearm and then down arm.  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  Not exertional.  He is having anxiety due to it.  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 soda per day     Does Patient Exercise: yes     Type of exercise: walking     Exercise (avg: min/session): 30-60     Times/week: 3  Safety-Violence-Falls     Seat Belt Use: yes   Updated Prior Medication List: ATRIPLA 600-200-300 MG TABS (EFAVIRENZ-EMTRICITAB-TENOFOVIR) at bedtime FENOFIBRATE 54 MG TABS (FENOFIBRATE) one by mouth once daily  Current Allergies (reviewed today): No known allergies  Past History:  Past Medical History: Last updated: 08/05/2008 HIV disease  - dxed 2001 -  dxed when he had a PNA Syphilis, 3/06 Cigarette smoking Hyperlipidemia Depression  Family History: Last updated: 06/17/2009 Dad with MI in 83s.   No hx of colon cancer  Social History: Last updated: 02/26/2007 Domestic Partner Alcohol use-yes Drug use-no Regular exercise-yes Former Smoker  Risk Factors: Alcohol Use: 1 glass at dinner (06/17/2009) Caffeine Use: 1 16oz. soda per day  (06/17/2009) Exercise: yes (06/17/2009)  Risk Factors: Smoking Status: quit (06/17/2009) Packs/Day: 1/2 ppd (06/17/2009) Passive Smoke Exposure: no (06/17/2009)  Family History: Dad with MI in 76s.   No hx of colon cancer  Review of Systems       11 systems reviewed and negative except per HPI   Vital Signs:  Patient profile:   40 year old male Height:      71 inches (180.34 cm) Weight:      184.8 pounds (84 kg) BMI:     25.87 Temp:     97.7 degrees F (36.50 degrees C) oral Pulse rate:   70 / minute BP sitting:   126 / 78  (left arm)  Vitals Entered By: Baxter Hire) (June 17, 2009 9:55 AM) CC: follow-up visit / pain in shoulder and neck only happens when arm gets in an odd position Pain Assessment Patient in pain? no      Nutritional Status BMI of 25 - 29 = overweight Nutritional Status Detail appetite is fine per patient  Have you ever been in a relationship where you felt threatened, hurt or afraid?No   Does patient need assistance? Functional Status Self care Ambulation Normal   Physical Exam  General:  alert and well-developed.   Head:  normocephalic and atraumatic.   Eyes:  vision grossly intact and pupils equal.   Ears:  R ear normal and L ear  normal.   Nose:  no external deformity and nose piercing noted.   Mouth:  good dentition.   Neck:  supple.   Lungs:  normal respiratory effort and normal breath sounds.   Heart:  normal rate, regular rhythm, and no murmur.   Abdomen:  soft, non-tender, and normal bowel sounds.   Msk:  normal ROM, no joint tenderness, and no joint swelling.   Extremities:  no cce Lue wiht no redness tenderness or pain.   Neurologic:  sensation wnl lue. Skin:  no rashes.   Cervical Nodes:  no anterior cervical adenopathy and no posterior cervical adenopathy.   Psych:  Oriented X3 and memory intact for recent and remote.          Medication Adherence: 06/17/2009   Adherence to medications reviewed with patient.  Counseling to provide adequate adherence provided   Prevention For Positives: 06/17/2009   Safe sex practices discussed with patient. Condoms offered.                             Impression & Recommendations:  Problem # 1:  HIV DISEASE (ICD-042)  Well controlled. cont current regimen Discussed HIV prevention, disclosure of HIV status to partners, and use of condoms for all genital contact with the patient.  Diagnostics Reviewed:  HIV: HIV positive - not AIDS (04/20/2008)   CD4: 420 (07/22/2008)   WBC: 6.3 (07/21/2008)   Hgb: 16.1 (07/21/2008)   HCT: 46.3 (07/21/2008)   Platelets: 252 (07/21/2008) HIV-1 RNA: 142 (07/21/2008)   HBSAg: No (05/21/2006)  Diagnostics Reviewed:  HIV: CDC-defined AIDS (08/05/2008)   CD4: 370 (12/30/2008)   WBC: 6.1 (12/29/2008)   Hgb: 16.1 (12/29/2008)   HCT: 47.7 (12/29/2008)   Platelets: 232 (12/29/2008) HIV-1 RNA: <48 copies/mL (12/29/2008)   HBSAg: No (05/21/2006)  Diagnostics Reviewed:  HIV: CDC-defined AIDS (08/05/2008)   CD4: 310 (06/03/2009)   WBC: 5.7 (06/02/2009)   Hgb: 15.9 (06/02/2009)   HCT: 47.7 (06/02/2009)   Platelets: 241 (06/02/2009) HIV-1 RNA: 48 (06/02/2009)   HBSAg: No (05/21/2006)  Problem # 2:  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 One by mouth once daily  Labs Reviewed: SGOT: 29 (06/02/2009)   SGPT: 31 (06/02/2009)  Lipid Goals: Chol Goal: 200 (08/05/2008)   HDL Goal: 40 (08/05/2008)   LDL Goal: 130 (08/05/2008)   TG Goal: 150 (08/05/2008)  Prior 10 Yr Risk Heart Disease: 4 % (08/05/2008)   HDL:29 (06/02/2009), 32 (07/21/2008)  LDL:115 (06/02/2009), 101 (07/21/2008)  Chol:196 (06/02/2009), 178 (07/21/2008)  Trig:262 (06/02/2009), 227 (07/21/2008)  Problem # 3:  TOBACCO USER (ICD-305.1) he has quit   Problem # 4:  ABNORMAL FINDINGS, ELEVATED BP W/O HTN (ICD-796.2)  BP today: 126/78 Prior BP: 124/83 (01/19/2009)  Prior  10 Yr Risk Heart Disease: 4 % (08/05/2008)  Labs Reviewed: Creat: 0.84 (06/02/2009) Chol: 196 (06/02/2009)   HDL: 29 (06/02/2009)   LDL: 115 (06/02/2009)   TG: 262 (06/02/2009)  Instructed in low sodium diet (DASH Handout) and behavior modification.    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   Also rec a soft support for his arms when typing. Ibuprofen 800 mg foe 5 days  Other Orders: Est. Patient Level IV (  16109) Future Orders: T-CD4SP ([facility]) (CD4SP) ... 12/14/2009 T-HIV Viral Load [phone]) ... 12/14/2009 T-CBC w/Diff ([id number]) ... 12/14/2009 T-Comprehensive Metabolic Panel [phone]) ... 12/14/2009  Patient Instructions: 1)  Please schedule a follow-up appointment in 6 months with Dr Ninetta Lights. 2)  Be sure to return for lab work one (1) week before your next appointment as scheduled. 3)  Get a support under your arm while typing.   4)  Start ibuprofen 600 or 800 three times a day with food for 5 days then take as needed. 5)  If elbow and arm pain persists we will refer you to sports medicine  Prescriptions: FENOFIBRATE 54 MG TABS (FENOFIBRATE) one by mouth once daily  #90 x 3   Entered and Authorized by:   Clydie Braun MD   Signed by:   Clydie Braun MD on 06/17/2009   Method used:   Print then Give to Patient   RxID:   [id number] ATRIPLA 600-200-300 MG TABS (EFAVIRENZ-EMTRICITAB-TENOFOVIR) at bedtime  #90 x 3   Entered and Authorized by:   Clydie Braun MD   Signed by:   Clydie Braun MD on 06/17/2009   Method used:   Print then Give to Patient   RxID:   [id number]  Process Orders Check Orders Results:     [facility]: ABN not required for this insurance Tests Sent for requisitioning (June 17, 2009 10:28 AM):      12/14/2009: [facility] -- T-HIV Viral Load [phone] (signed)     12/14/2009: [facility] -- T-CBC w/Diff [[id number]] (signed)     12/14/2009: [facility] -- T-Comprehensive Metabolic Panel [phone] (signed)

## 2010-05-17 ENCOUNTER — Other Ambulatory Visit: Payer: Self-pay

## 2010-05-31 ENCOUNTER — Ambulatory Visit: Payer: Self-pay | Admitting: Infectious Diseases

## 2010-06-08 ENCOUNTER — Other Ambulatory Visit (INDEPENDENT_AMBULATORY_CARE_PROVIDER_SITE_OTHER): Payer: BC Managed Care – PPO

## 2010-06-08 ENCOUNTER — Other Ambulatory Visit: Payer: Self-pay | Admitting: Infectious Diseases

## 2010-06-08 ENCOUNTER — Encounter: Payer: Self-pay | Admitting: Infectious Diseases

## 2010-06-08 DIAGNOSIS — B20 Human immunodeficiency virus [HIV] disease: Secondary | ICD-10-CM

## 2010-06-08 LAB — CONVERTED CEMR LAB
Albumin: 4.4 g/dL (ref 3.5–5.2)
Alkaline Phosphatase: 55 units/L (ref 39–117)
BUN: 14 mg/dL (ref 6–23)
CO2: 27 meq/L (ref 19–32)
Calcium: 9.4 mg/dL (ref 8.4–10.5)
Glucose, Bld: 109 mg/dL — ABNORMAL HIGH (ref 70–99)
HDL: 34 mg/dL — ABNORMAL LOW (ref >39)
Hemoglobin: 15.7 g/dL (ref 13.0–17.0)
LDL Cholesterol: 113 mg/dL — ABNORMAL HIGH (ref 0–99)
Lymphocytes Relative: 35 % (ref 12–46)
Lymphs Abs: 1.7 10*3/uL (ref 0.7–4.0)
Monocytes Absolute: 0.3 10*3/uL (ref 0.1–1.0)
Monocytes Relative: 7 % (ref 3–12)
Neutro Abs: 2.7 10*3/uL (ref 1.7–7.7)
Potassium: 4.5 meq/L (ref 3.5–5.3)
RBC: 5.17 M/uL (ref 4.22–5.81)
Total CHOL/HDL Ratio: 5.9
Triglycerides: 260 mg/dL — ABNORMAL HIGH (ref <150)
WBC: 4.7 10*3/uL (ref 4.0–10.5)

## 2010-06-09 LAB — T-HELPER CELL (CD4) - (RCID CLINIC ONLY): CD4 % Helper T Cell: 19 % — ABNORMAL LOW (ref 33–55)

## 2010-06-22 ENCOUNTER — Ambulatory Visit (INDEPENDENT_AMBULATORY_CARE_PROVIDER_SITE_OTHER): Payer: BC Managed Care – PPO | Admitting: Infectious Diseases

## 2010-06-22 ENCOUNTER — Encounter: Payer: Self-pay | Admitting: Infectious Diseases

## 2010-06-22 VITALS — BP 131/83 | HR 79 | Temp 98.1°F | Ht 71.0 in | Wt 177.5 lb

## 2010-06-22 DIAGNOSIS — B20 Human immunodeficiency virus [HIV] disease: Secondary | ICD-10-CM

## 2010-06-22 DIAGNOSIS — E781 Pure hyperglyceridemia: Secondary | ICD-10-CM

## 2010-06-22 NOTE — Assessment & Plan Note (Signed)
Will recheck his lipids at next visit. He is exercising, is aware he needs to exercise more.

## 2010-06-22 NOTE — Progress Notes (Signed)
  Subjective:    Patient ID: John Farrell, male    DOB: [birth year], 40 y.o.   MRN: [id number]  HPI 40 yo with HIV, hypertriglyceredemia, mild depression.  Near 100% adherence. No side effects. CD4 400 --> 320 and VL <20. Cholesterol 199, Trig 260 (06-08-10).  Has been doing well, had some difficulty with med refill but did not miss doses.   Review of Systems  Gastrointestinal: Negative for diarrhea and constipation.  Genitourinary: Negative for dysuria.       Objective:   Physical Exam  Constitutional: He appears well-developed and well-nourished.  Eyes: EOM are normal. Pupils are equal, round, and reactive to light.  Neck: Neck supple.  Cardiovascular: Normal rate, regular rhythm and normal heart sounds.   Pulmonary/Chest: Effort normal and breath sounds normal.  Abdominal: Soft. Bowel sounds are normal.          Assessment & Plan:

## 2010-06-22 NOTE — Assessment & Plan Note (Signed)
He is doing well. Had some decrease in CD4, will cont to watch this. His vaccines are uptodate. He is offered condoms, which he already has. Will see him back in 4-5 months with labs prior.

## 2010-06-30 LAB — T-HELPER CELL (CD4) - (RCID CLINIC ONLY): CD4 % Helper T Cell: 20 % — ABNORMAL LOW (ref 33–55)

## 2010-07-06 LAB — T-HELPER CELL (CD4) - (RCID CLINIC ONLY)
CD4 % Helper T Cell: 20 % — ABNORMAL LOW (ref 33–55)
CD4 T Cell Abs: 420 uL (ref 400–2700)

## 2010-08-12 NOTE — Discharge Summary (Signed)
[place]  Patient:    John Farrell                     MRN: [id number] Adm. Date:  [id number] Disc. Date: [id number] Attending:  Farley Ly Dictator:   Gwenlyn Found, M.D.                           Discharge Summary  DISCHARGE DIAGNOSES: 1. 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 2. Human immunodeficiency virus. 3. Oral candidiasis.  DISCHARGE MEDICATIONS: 1. Serevent one puff b.i.d. 2. Levaquin 500 mg p.o. q.d. x 14 days. 3. Humibid LA two tablets p.o. b.i.d. 4. Combivir one tab p.o. b.i.d. 5. ______ 1250 mg p.o. b.i.d. 6. Bactrim double strength one tab p.o. q.d. 7. Azithromycin 1200 mg p.o. every week.  PROCEDURES: 1. High resolution CAT of the chest with contrast media on 03/30/99, that showed    right middle lobe infiltrate with some atelectasis and scarring.  There was o    definite obstruction of the bronchi.  There was some bronchial wall thickening    noted in the region of the right middle lobe and both lung bases, but no    definite bronchiectasis was identified. 2. CT of sinus limited without contrast media to rule out sinusitis on 03/30/99.  This showed complete opacification of the left maxillary sinus.  There was some  mucosal membrane thickening associated with the ethmoid air cells.  There was    partial aeration of the right superior nasal turbinates.  CONSULTATIONS: 1. Critical care medicine/pulmonary performed by Dr. Rowland Lathe on 03/30/99. 2. Infectious disease consult by Dr. Lenn Sink on 03/30/99.  CHIEF COMPLAINT:  Worsening pneumonia.  HISTORY OF PRESENT ILLNESS:  John Farrell is a 40 year old white male with HIV diagnosed in September 2000, originally presented in November with right middle  lobe consolidation.  He was admitted to the medical teaching service on 11/27, started on azithromycin and cefotaxime with improvement of his symptoms and chest x-ray.  Because of his  improvement and negative cultures and smears, he was discharged home on two more days of p.o. Zithromax and Bactrim for PCP prophylaxis. Also because of his history it was felt by pulmonary critical care medicine that a bronchoscopy was not indicated.  On 03/18/99 the patient began to have fevers again with cough productive of chunky white-greenish sputum.  He was seen in the outpatient clinic on 12/29, had a chest x-ray suggestive of worsening pneumonia. He returned on the day of admission on 03/29/99, to schedule follow up and bronchoscopy, was then admitted to expedite matters.  PAST MEDICAL HISTORY: 1. Human immunodeficiency virus diagnosed September 2000.  His viral load and CD4    count on 02/21/99 were 34311 and 10, respectively. 2. Pneumonia, right middle lobe, originally diagnosed in 11/00, and improved by  02/24/99.  ALLERGIES:  No known drug allergies.  MEDICATIONS: 1. Combivir one p.o. b.i.d. 2. ______ 1250 mg p.o. b.i.d. 3. Bactrim double strength one p.o. q.d. 4. Azithromycin 1200 mg p.o. every week. 5. Levaquin 500 mg p.o. q.d.  SOCIAL HISTORY:  Lives with partner in [place] and is employed at [employer].  Tobacco:  He smokes 1/2 pack per day, has done so since he was 40 years old.  Alcohol:  None.  Drugs:  None.  FAMILY HISTORY:  Noncontributory.  REVIEW OF SYSTEMS:  Positive for fevers, productive cough, oral thrush, shortness of breath,  chest pain, watery diarrhea.  No nausea, vomiting, abdominal pain.  PHYSICAL EXAMINATION:  VITAL SIGNS:  Temperature 101 in outpatient clinic and 99.2 on the floor, blood  pressure 121/76, pulse 119, respiratory rate 14.  SAO2 94% on room air.  GENERAL:  He is a cachectic white male in no acute distress.  SKIN:  Warm and dry.  NECK:  Supple with slight submandibular lymphadenopathy.  LUNGS:  Few crackles at the right base, otherwise good air movement.  HEART:  Tachycardic, normal S1 and S2, no  murmurs, rubs, or gallops.  ABDOMEN:  Soft, nontender, nondistended, with normoactive bowel sounds.  No hepatosplenomegaly appreciated.  EXTREMITIES:  There is no edema, and there are 2+ peripheral pulses bilaterally  throughout.  HEENT:  Normocephalic, atraumatic.  There is slight temporal wasting bilaterally. Pupils are equal, round and reactive to light and accommodation.  Extraocular movements intact.  Sclera are anicteric.  Oropharynx shows mild thrush with erythema.  Nares are patent bilaterally.  NEUROLOGIC:  Grossly intact.  Toes are downgoing bilaterally.  LABORATORY DATA:  White blood cell count 15.1, hemoglobin 9.4, platelets 463, 85% PMN, 12.9 absolute neutrophil count, 1.5 absolute lymphocyte count.  Sodium 132, potassium 4.4, chloride 99, CO2 25, BUN 7, creatinine 0.6, glucose 94.  Serum osmolality 278, albumin 2.5, SGOT 31, SGPT 14, total protein 6.5, calcium 8.4.  Alkaline phosphatase 46, total bilirubin 0.3.  ASSESSMENT:  John Farrell is a 40 year old white male who was diagnosed with HIV in September.  He presented with pneumonia at the end of November.  He now presents with a recurrence of his previous pneumonia.  Prior episodes of pneumonia. Responded to cefotaxime and this was thought to be likely bacterial in nature.  There is a possibility that his prior episode was simply inadequately treated in that he would be suffering from right middle lobe syndrome.  ______ to the opportunistic and fact that the patient has HIV with a CD4 count of 10.  The patient would be admitted with plans to begin to evaluate him for AFB and PCP.  Also a plan was to contact the pulmonary critical care for the possibility of a  bronchoscopy as an inpatient.  He will be admitted with the treatment to begin or bacterial pneumonia, and would be resumed on his previous anti-retroviral therapy medications.  HOSPITAL COURSE:  On 03/29/99, the patient was admitted to the floor  in stable condition.  On 03/30/99, the patient slept poorly the previous evening secondary o changes in environment, and had some cough, but was tolerating p.o. without  difficulty.  In general, he appeared to be diaphoretic and had some occasional rales at the right base, but otherwise was doing quite well.  The plan on the first full hospital day was to start him on Levaquin, but to also add cefotaxime, as hat seemed to work well during his previous admission.  He was continued on his anti-retroviral therapy, and he was originally started on Nystatin swish and swallow which was changed to Diflucan for better coverage.  On 03/30/99, Dr. Roxan Hockey consulted for infectious disease and felt that the patients physical examination findings were consistent with right middle lobe syndrome, and requested that a igh resolution CT scan be obtained.  A high resolution CT scan did show consolidation of the right middle lobe which would be consistent with right middle lobe syndrome. Additionally, a CT of the sinuses was performed to evaluate for possible sinus disease, and this did demonstrate evidence of sinus disease as dictated  previously. Thus, it was felt that the patients symptoms were likely a result of sinusitis with sinus drainage into the oropharynx and down into the lung cavity which resulted in right middle lobe consolidation.  Because of these findings, it was  felt the patient should be continued on Levaquin at his current dose, and that cefotaxime would not be necessary.  Also on 03/30/99, pulmonary critical care consulted on the patient and agreed that this was consistent with right middle obe syndrome, and felt that a bronchoscopy was not necessary.  It was suggested that the patient continue on his Levaquin, but also start on a Serevent one p.o. b.i.d. and continue on Humibid.  It was felt that a bronchoscopy was not needed at this time, but should the patients symptoms worsen  or should he not improve on a course of Levaquin that the patient could have bronchoscopy as an outpatient.  Thus, the patient was discharged with a 14 day course of Levaquin, Serevent, and Humibid A as a mucolytic agent.  FOLLOWUP:  The patient is to follow up with Dr. Roxan Hockey in the Infectious Disease Clinic on 04/25/99 at 3:00 p.m.DD:  04/27/99 TD:  04/27/99 Job: [id number]

## 2010-09-21 ENCOUNTER — Other Ambulatory Visit: Payer: Self-pay | Admitting: Infectious Diseases

## 2010-09-21 DIAGNOSIS — B20 Human immunodeficiency virus [HIV] disease: Secondary | ICD-10-CM

## 2010-10-10 ENCOUNTER — Other Ambulatory Visit (INDEPENDENT_AMBULATORY_CARE_PROVIDER_SITE_OTHER): Payer: BC Managed Care – PPO

## 2010-10-10 DIAGNOSIS — B20 Human immunodeficiency virus [HIV] disease: Secondary | ICD-10-CM

## 2010-10-11 LAB — CBC WITH DIFFERENTIAL/PLATELET
Basophils Absolute: 0 10*3/uL (ref 0.0–0.1)
Eosinophils Relative: 2 % (ref 0–5)
Lymphocytes Relative: 31 % (ref 12–46)
MCV: 92.7 fL (ref 78.0–100.0)
Platelets: 213 10*3/uL (ref 150–400)
RDW: 14.6 % (ref 11.5–15.5)
WBC: 5.7 10*3/uL (ref 4.0–10.5)

## 2010-10-11 LAB — COMPREHENSIVE METABOLIC PANEL
ALT: 29 U/L (ref 0–53)
Albumin: 4.4 g/dL (ref 3.5–5.2)
CO2: 25 mEq/L (ref 19–32)
Calcium: 9.4 mg/dL (ref 8.4–10.5)
Chloride: 105 mEq/L (ref 96–112)
Potassium: 4.3 mEq/L (ref 3.5–5.3)
Sodium: 139 mEq/L (ref 135–145)
Total Protein: 6.7 g/dL (ref 6.0–8.3)

## 2010-10-11 LAB — HIV-1 RNA QUANT-NO REFLEX-BLD: HIV-1 RNA Quant, Log: <1.3 {Log} (ref <1.30)

## 2010-10-24 ENCOUNTER — Ambulatory Visit (INDEPENDENT_AMBULATORY_CARE_PROVIDER_SITE_OTHER): Payer: BC Managed Care – PPO | Admitting: Infectious Diseases

## 2010-10-24 ENCOUNTER — Encounter: Payer: Self-pay | Admitting: Infectious Diseases

## 2010-10-24 VITALS — BP 139/81 | HR 67 | Temp 98.7°F | Ht 71.0 in | Wt 181.0 lb

## 2010-10-24 DIAGNOSIS — Z23 Encounter for immunization: Secondary | ICD-10-CM

## 2010-10-24 DIAGNOSIS — Z79899 Other long term (current) drug therapy: Secondary | ICD-10-CM

## 2010-10-24 DIAGNOSIS — Z113 Encounter for screening for infections with a predominantly sexual mode of transmission: Secondary | ICD-10-CM

## 2010-10-24 DIAGNOSIS — B20 Human immunodeficiency virus [HIV] disease: Secondary | ICD-10-CM

## 2010-10-24 DIAGNOSIS — E781 Pure hyperglyceridemia: Secondary | ICD-10-CM

## 2010-10-24 MED ORDER — EFAVIRENZ-EMTRICITAB-TENOFOVIR 600-200-300 MG PO TABS: 1.0000 | ORAL_TABLET | Freq: Every day | ORAL | Status: DC

## 2010-10-24 NOTE — Assessment & Plan Note (Signed)
He is doing very well. Will cont his current rx. He is offered condoms. Will see him back in 6 months, 9am appt. Labs prior.

## 2010-10-24 NOTE — Assessment & Plan Note (Signed)
He has been on a regular exercise regimen with his partner. Will recheck his lipids at his next visit.

## 2010-10-24 NOTE — Progress Notes (Signed)
  Subjective:    Patient ID: John Farrell, male    DOB: 1970/04/19, 40 y.o.   MRN: [id number]  HPI 40 yo M HIV+, hypertriglyceredemia, mild depression.  Near 100% adherence. No side effects. CD4 370 and VL <20 (10-10-10). Cholesterol 199, Trig 260 (06-08-10).  Review of Systems  Gastrointestinal: Negative for diarrhea and constipation.  Genitourinary: Negative for dysuria.       Objective:   Physical Exam  Constitutional: He appears well-developed and well-nourished.  Eyes: EOM are normal. Pupils are equal, round, and reactive to light.  Neck: Neck supple.  Cardiovascular: Normal rate, regular rhythm and normal heart sounds.   Pulmonary/Chest: Effort normal and breath sounds normal. No respiratory distress.  Abdominal: Soft. Bowel sounds are normal. There is no tenderness.  Lymphadenopathy:    He has no cervical adenopathy.          Assessment & Plan:   No problem-specific assessment & plan notes found for this encounter.

## 2010-10-26 ENCOUNTER — Telehealth: Payer: Self-pay | Admitting: Licensed Clinical Social Worker

## 2010-10-26 NOTE — Telephone Encounter (Signed)
Patient called stating that he went to the ER yesterday because of fever and sore throat. He was placed on Amoxicillin and he took a dose yesterday and today. His concern was that he still had fever of 100, had a pus pocket on his tonsil and  the strep test was negative at the ER. I explained to him that he would need to keep taking the medication and that it usually takes 24 to 48 hours to feel better, and that the physician treated him for strep according to his symptoms and examination. I advised the patient that if he starts to feel worse by tomorrow, or fever increases to follow up with our office. Patient agreed.

## 2010-12-22 LAB — T-HELPER CELL (CD4) - (RCID CLINIC ONLY)
CD4 % Helper T Cell: 20 — ABNORMAL LOW
CD4 T Cell Abs: 340 — ABNORMAL LOW

## 2010-12-23 ENCOUNTER — Telehealth: Payer: Self-pay | Admitting: Licensed Clinical Social Worker

## 2010-12-23 NOTE — Telephone Encounter (Signed)
Patient states that he has a feeling of something being stuck in his throat, and also that his ears get clogged up around 3:00pm everyday. Patient has an appointment on Monday at 3:30pm

## 2010-12-26 ENCOUNTER — Ambulatory Visit (INDEPENDENT_AMBULATORY_CARE_PROVIDER_SITE_OTHER): Payer: BC Managed Care – PPO | Admitting: Infectious Diseases

## 2010-12-26 ENCOUNTER — Encounter: Payer: Self-pay | Admitting: Infectious Diseases

## 2010-12-26 VITALS — BP 167/88 | HR 74 | Temp 98.7°F | Ht 71.0 in | Wt 169.0 lb

## 2010-12-26 DIAGNOSIS — J329 Chronic sinusitis, unspecified: Secondary | ICD-10-CM | POA: Insufficient documentation

## 2010-12-26 NOTE — Progress Notes (Signed)
  Subjective:    Patient ID: John Farrell, male    DOB: October 23, 1970, 40 y.o.   MRN: [id number]  HPI HPI 40 yo M HIV+, hypertriglyceredemia, mild depression.  Been feeling fine- "really strange stuff been going on". Woke up end of August feeling poorly, had fever to 102, saw white tonsilar drainage on L tonsil/pharynx. Tried robitussin and then was seen in Urgent Care- had rapid strep (-). Was told it could be thrush, was put on amoxil. Then noted d/c on other side of throat. He completed course of amoxil. Fever resolved, ache-ness resolved, white d/c resolved. At end if August, went on vacation and began to have sinus congestion. Upon return to [facility] felt poorly and noted his throat was puffy and irritated.  Went to different urgent care labor day weekend and had strep test (+). Took another course of amoxil. Since has noted puffiness continued in his throat. He then developed chest congestion and green-white sputum. After 2 weeks he had continued sputum production. Felt wheezy. Saw PA at his workplace, and was given z-pack. Congestion resolved but felt like he had something stuck in his throat ( no dysphagia, no burning). Was concerned he had muscle spasms. Last week has noted R>L ear clogged at 3pm exactly and then would resolve by 5pm.  Has tried mucinex for 2 days when he was having chest congestion.  CD4 370 and VL <20 (10-10-10). Cholesterol 199, Trig 260 (March-2012).    Review of Systems     Objective:   Physical Exam  HENT:  Right Ear: Tympanic membrane, external ear and ear canal normal.  Left Ear: Tympanic membrane, external ear and ear canal normal.  Mouth/Throat: No oropharyngeal exudate, posterior oropharyngeal edema, posterior oropharyngeal erythema or tonsillar abscesses.  Neck: Neck supple.  Cardiovascular: Normal rate, regular rhythm and normal heart sounds.   Pulmonary/Chest: Effort normal and breath sounds normal. No respiratory distress. He has no wheezes.  Abdominal:  Soft. Bowel sounds are normal. There is no tenderness. There is no rebound.  Lymphadenopathy:    He has no cervical adenopathy.          Assessment & Plan:   Subjective:            Objective:   Physical Exam           Assessment & Plan:

## 2010-12-26 NOTE — Assessment & Plan Note (Signed)
Advised pt to try otc anti-histamine. Could try pseudophed containing product. Will get flu shot at work tomorrow. Will see back in  January 2013. He will get labs done at his place of employment.

## 2011-01-03 LAB — T-HELPER CELL (CD4) - (RCID CLINIC ONLY)
CD4 % Helper T Cell: 17 — ABNORMAL LOW
CD4 T Cell Abs: 260 — ABNORMAL LOW

## 2011-03-19 ENCOUNTER — Telehealth: Payer: Self-pay | Admitting: Infectious Diseases

## 2011-03-20 ENCOUNTER — Other Ambulatory Visit: Payer: Self-pay | Admitting: *Deleted

## 2011-03-20 DIAGNOSIS — B20 Human immunodeficiency virus [HIV] disease: Secondary | ICD-10-CM

## 2011-03-20 MED ORDER — FENOFIBRATE 54 MG PO TABS: 54.0000 mg | ORAL_TABLET | Freq: Every day | ORAL | Status: DC

## 2011-05-10 ENCOUNTER — Other Ambulatory Visit: Payer: BC Managed Care – PPO

## 2011-05-10 DIAGNOSIS — Z79899 Other long term (current) drug therapy: Secondary | ICD-10-CM

## 2011-05-10 DIAGNOSIS — B20 Human immunodeficiency virus [HIV] disease: Secondary | ICD-10-CM

## 2011-05-10 DIAGNOSIS — Z113 Encounter for screening for infections with a predominantly sexual mode of transmission: Secondary | ICD-10-CM

## 2011-05-10 LAB — COMPREHENSIVE METABOLIC PANEL
AST: 32 U/L (ref 0–37)
Albumin: 4.3 g/dL (ref 3.5–5.2)
BUN: 10 mg/dL (ref 6–23)
CO2: 27 mEq/L (ref 19–32)
Calcium: 9.5 mg/dL (ref 8.4–10.5)
Chloride: 103 mEq/L (ref 96–112)
Creat: 0.89 mg/dL (ref 0.50–1.35)
Glucose, Bld: 92 mg/dL (ref 70–99)
Potassium: 4.4 mEq/L (ref 3.5–5.3)

## 2011-05-10 LAB — CBC
HCT: 45.7 % (ref 39.0–52.0)
MCH: 30.9 pg (ref 26.0–34.0)
MCV: 88.9 fL (ref 78.0–100.0)
RDW: 13.6 % (ref 11.5–15.5)
WBC: 5.7 10*3/uL (ref 4.0–10.5)

## 2011-05-10 LAB — LIPID PANEL
Cholesterol: 194 mg/dL (ref 0–200)
HDL: 35 mg/dL — ABNORMAL LOW (ref >39)
Triglycerides: 204 mg/dL — ABNORMAL HIGH (ref <150)

## 2011-05-15 LAB — HIV-1 RNA QUANT-NO REFLEX-BLD: HIV-1 RNA Quant, Log: <1.3 {Log} (ref <1.30)

## 2011-05-24 ENCOUNTER — Encounter: Payer: Self-pay | Admitting: Infectious Diseases

## 2011-05-24 ENCOUNTER — Ambulatory Visit (INDEPENDENT_AMBULATORY_CARE_PROVIDER_SITE_OTHER): Payer: BC Managed Care – PPO | Admitting: Infectious Diseases

## 2011-05-24 VITALS — BP 160/89 | HR 94 | Temp 98.2°F | Ht 71.0 in | Wt 171.1 lb

## 2011-05-24 DIAGNOSIS — R030 Elevated blood-pressure reading, without diagnosis of hypertension: Secondary | ICD-10-CM

## 2011-05-24 DIAGNOSIS — B20 Human immunodeficiency virus [HIV] disease: Secondary | ICD-10-CM

## 2011-05-24 MED ORDER — LISINOPRIL 5 MG PO TABS: 5.0000 mg | ORAL_TABLET | Freq: Every day | ORAL | Status: DC

## 2011-05-24 NOTE — Assessment & Plan Note (Signed)
He is doing very well. Will cont his current rx. Offered condoms, refused, vax up to date. rtc for HIV in 6 months.

## 2011-05-24 NOTE — Progress Notes (Signed)
  Subjective:    Patient ID: John Farrell, male    DOB: December 04, 1970, 41 y.o.   MRN: [id number]  HPI 41 yo M HIV+, hypertriglyceredemia, mild depression.  CD4 390, VL <20, Trig 204 (05-10-11).  Trying to get over a cold right now. Concerned about his BP- 160/89 today. Has occas frontal headaches, at work, improves with rest and advil. Mostly has headache when temp is warmer, he questions wether is related to sinuses, allergies. Has not had over the winter. No CP or SOB. Exercises at gym regularly without limitation. No dizziness. Has more recently noted pulsation in his L arm and feels heart beat in his finger tips. Took BP at home and was 160/90.  Having joint aches in shoulders, in winter only. Fine in summer. Went to [facility] last month and had no joint pains. Has taken advil but only when he has headaches.      Review of Systems  Constitutional: Negative for appetite change and unexpected weight change.  Gastrointestinal: Negative for diarrhea and constipation.  Neurological: Negative for light-headedness and headaches.       Objective:   Physical Exam  Constitutional: He appears well-developed and well-nourished.  Eyes: EOM are normal. Pupils are equal, round, and reactive to light.  Neck: Neck supple.  Cardiovascular: Normal rate, regular rhythm and normal heart sounds.   Pulmonary/Chest: Effort normal and breath sounds normal.  Abdominal: Soft. Bowel sounds are normal. He exhibits no distension. There is no tenderness.  Musculoskeletal: He exhibits no edema.  Lymphadenopathy:    He has no cervical adenopathy.          Assessment & Plan:

## 2011-05-24 NOTE — Assessment & Plan Note (Signed)
Will start him on ace-i for his HTN, see him back in 1 month.

## 2011-06-21 ENCOUNTER — Encounter: Payer: Self-pay | Admitting: Infectious Diseases

## 2011-06-21 ENCOUNTER — Ambulatory Visit (INDEPENDENT_AMBULATORY_CARE_PROVIDER_SITE_OTHER): Payer: BC Managed Care – PPO | Admitting: Infectious Diseases

## 2011-06-21 VITALS — BP 128/80 | HR 81 | Temp 98.0°F | Ht 71.0 in | Wt 174.0 lb

## 2011-06-21 DIAGNOSIS — I10 Essential (primary) hypertension: Secondary | ICD-10-CM

## 2011-06-21 DIAGNOSIS — B20 Human immunodeficiency virus [HIV] disease: Secondary | ICD-10-CM

## 2011-06-21 LAB — BASIC METABOLIC PANEL
Chloride: 104 mEq/L (ref 96–112)
Glucose, Bld: 126 mg/dL — ABNORMAL HIGH (ref 70–99)
Potassium: 4.4 mEq/L (ref 3.5–5.3)
Sodium: 140 mEq/L (ref 135–145)

## 2011-06-21 NOTE — Assessment & Plan Note (Signed)
Doing well, will see him back in 5 months.

## 2011-06-21 NOTE — Progress Notes (Signed)
  Subjective:    Patient ID: John Farrell, male    DOB: 01/29/1971, 41 y.o.   MRN: [id number]  HPI 41 yo M HIV+, hypertriglyceredemia, mild depression.  CD4 390, VL <20, Trig 204 (05-10-11). Seen in ID clinic 04-2011 and started on ACE-I for his BP. No problems with medications- no cough, no LE edema.     Review of Systems     Objective:   Physical Exam  Constitutional: He appears well-developed and well-nourished.  Cardiovascular: Normal rate and regular rhythm.   Pulmonary/Chest: Effort normal and breath sounds normal.  Abdominal: Soft. Bowel sounds are normal. There is no tenderness.  Musculoskeletal: He exhibits no edema.          Assessment & Plan:

## 2011-06-21 NOTE — Assessment & Plan Note (Signed)
Doing much better on ACE-I. Will check his BMP since new ACE-I start. See him back in 5 months.

## 2011-09-17 ENCOUNTER — Other Ambulatory Visit: Payer: Self-pay | Admitting: Infectious Diseases

## 2011-09-17 DIAGNOSIS — B20 Human immunodeficiency virus [HIV] disease: Secondary | ICD-10-CM

## 2011-10-30 ENCOUNTER — Other Ambulatory Visit: Payer: BC Managed Care – PPO

## 2011-11-13 ENCOUNTER — Ambulatory Visit: Payer: BC Managed Care – PPO | Admitting: Infectious Diseases

## 2011-11-15 ENCOUNTER — Other Ambulatory Visit (INDEPENDENT_AMBULATORY_CARE_PROVIDER_SITE_OTHER): Payer: BC Managed Care – PPO

## 2011-11-15 DIAGNOSIS — B20 Human immunodeficiency virus [HIV] disease: Secondary | ICD-10-CM

## 2011-11-15 LAB — CBC
Hemoglobin: 14.7 g/dL (ref 13.0–17.0)
MCH: 30 pg (ref 26.0–34.0)
RBC: 4.9 MIL/uL (ref 4.22–5.81)

## 2011-11-15 LAB — COMPREHENSIVE METABOLIC PANEL
Albumin: 4.2 g/dL (ref 3.5–5.2)
CO2: 30 mEq/L (ref 19–32)
Glucose, Bld: 100 mg/dL — ABNORMAL HIGH (ref 70–99)
Potassium: 4.7 mEq/L (ref 3.5–5.3)
Sodium: 138 mEq/L (ref 135–145)
Total Protein: 6.5 g/dL (ref 6.0–8.3)

## 2011-11-16 LAB — T-HELPER CELL (CD4) - (RCID CLINIC ONLY)
CD4 % Helper T Cell: 22 % — ABNORMAL LOW (ref 33–55)
CD4 T Cell Abs: 380 uL — ABNORMAL LOW (ref 400–2700)

## 2011-11-16 LAB — HIV-1 RNA QUANT-NO REFLEX-BLD
HIV 1 RNA Quant: <20 copies/mL (ref <20)
HIV-1 RNA Quant, Log: <1.3 {Log} (ref <1.30)

## 2011-11-29 ENCOUNTER — Ambulatory Visit (INDEPENDENT_AMBULATORY_CARE_PROVIDER_SITE_OTHER): Payer: BC Managed Care – PPO | Admitting: Infectious Diseases

## 2011-11-29 ENCOUNTER — Encounter: Payer: Self-pay | Admitting: Infectious Diseases

## 2011-11-29 VITALS — BP 125/79 | HR 93 | Temp 98.6°F | Ht 71.0 in | Wt 168.0 lb

## 2011-11-29 DIAGNOSIS — B20 Human immunodeficiency virus [HIV] disease: Secondary | ICD-10-CM

## 2011-11-29 DIAGNOSIS — I10 Essential (primary) hypertension: Secondary | ICD-10-CM

## 2011-11-29 DIAGNOSIS — Z113 Encounter for screening for infections with a predominantly sexual mode of transmission: Secondary | ICD-10-CM

## 2011-11-29 DIAGNOSIS — R109 Unspecified abdominal pain: Secondary | ICD-10-CM

## 2011-11-29 DIAGNOSIS — Z79899 Other long term (current) drug therapy: Secondary | ICD-10-CM

## 2011-11-29 NOTE — Assessment & Plan Note (Signed)
Doing well with ACE-I. Will continue to watch. Cr has been normal.

## 2011-11-29 NOTE — Assessment & Plan Note (Signed)
Suspect cholelithiasis, could be IBS? Has HIDA in AM. His HIV status does not preclude surgery. He is hopeful he can be treated medically.

## 2011-11-29 NOTE — Progress Notes (Signed)
  Subjective:    Patient ID: John Farrell, male    DOB: 1971-03-01, 41 y.o.   MRN: [id number]  HPI 41 yo M HIV+, hypertriglyceredemia, mild depression.   Seen in ID clinic 04-2011 and started on ACE-I for his BP. Doing well except for problems with abd pain. Has been intermittent. Worse after eating heavy meal. Feels crampy. Tried bentyl, improved same day. Has since quit eating red meat. Had repeated episode after eating large meals. Was worried he had gallstones, has had ultrasound (+gallstones), saw surgeon ( wants to get HIDA scan, if negative will get GI eval). Has lost some weight.    HIV 1 RNA Quant (copies/mL)  Date Value  11/15/2011 <20   05/10/2011 <20   10/10/2010 <20      CD4 T Cell Abs (cmm)  Date Value  11/15/2011 380*  05/10/2011 390*  10/10/2010 370*      Review of Systems     Objective:   Physical Exam  Constitutional: He appears well-developed and well-nourished.  Eyes: EOM are normal. Pupils are equal, round, and reactive to light.  Neck: Neck supple.  Cardiovascular: Normal rate, regular rhythm and normal heart sounds.   Pulmonary/Chest: Effort normal and breath sounds normal.  Abdominal: Soft. Bowel sounds are normal. He exhibits no distension. There is no tenderness.  Musculoskeletal: He exhibits no edema.  Lymphadenopathy:    He has no cervical adenopathy.          Assessment & Plan:

## 2011-11-29 NOTE — Assessment & Plan Note (Signed)
He is doing well. Will cont his atripla. He is offered condoms (refused), in committed relationship. Will see him back in 6 months with labs prior. He will get flu shot at work and let us know when given.

## 2012-02-08 ENCOUNTER — Other Ambulatory Visit: Payer: Self-pay | Admitting: Infectious Diseases

## 2012-03-17 ENCOUNTER — Other Ambulatory Visit: Payer: Self-pay | Admitting: Infectious Diseases

## 2012-03-27 HISTORY — PX: CHOLECYSTECTOMY: SHX55

## 2012-05-15 ENCOUNTER — Other Ambulatory Visit: Payer: Self-pay | Admitting: Infectious Diseases

## 2012-05-15 ENCOUNTER — Other Ambulatory Visit: Payer: PRIVATE HEALTH INSURANCE

## 2012-05-15 ENCOUNTER — Other Ambulatory Visit (HOSPITAL_COMMUNITY)
Admission: RE | Admit: 2012-05-15 | Discharge: 2012-05-15 | Disposition: A | Discharge status: Home / Self Care | Payer: PRIVATE HEALTH INSURANCE | Source: Ambulatory Visit | Attending: Infectious Diseases | Admitting: Infectious Diseases

## 2012-05-15 DIAGNOSIS — Z113 Encounter for screening for infections with a predominantly sexual mode of transmission: Secondary | ICD-10-CM | POA: Insufficient documentation

## 2012-05-15 DIAGNOSIS — Z79899 Other long term (current) drug therapy: Secondary | ICD-10-CM

## 2012-05-15 DIAGNOSIS — B20 Human immunodeficiency virus [HIV] disease: Secondary | ICD-10-CM

## 2012-05-15 LAB — CBC
MCH: 30.3 pg (ref 26.0–34.0)
MCV: 85.7 fL (ref 78.0–100.0)
Platelets: 216 10*3/uL (ref 150–400)
RDW: 14.3 % (ref 11.5–15.5)

## 2012-05-15 LAB — LIPID PANEL
Cholesterol: 203 mg/dL — ABNORMAL HIGH (ref 0–200)
Total CHOL/HDL Ratio: 5.5 Ratio
VLDL: 41 mg/dL — ABNORMAL HIGH (ref 0–40)

## 2012-05-15 LAB — COMPREHENSIVE METABOLIC PANEL
ALT: 29 U/L (ref 0–53)
AST: 35 U/L (ref 0–37)
CO2: 32 mEq/L (ref 19–32)
Creat: 0.88 mg/dL (ref 0.50–1.35)
Total Bilirubin: 0.3 mg/dL (ref 0.3–1.2)

## 2012-05-16 LAB — T-HELPER CELL (CD4) - (RCID CLINIC ONLY): CD4 T Cell Abs: 360 uL — ABNORMAL LOW (ref 400–2700)

## 2012-05-29 ENCOUNTER — Ambulatory Visit (INDEPENDENT_AMBULATORY_CARE_PROVIDER_SITE_OTHER): Payer: PRIVATE HEALTH INSURANCE | Admitting: Infectious Diseases

## 2012-05-29 ENCOUNTER — Encounter: Payer: Self-pay | Admitting: Infectious Diseases

## 2012-05-29 VITALS — BP 131/76 | HR 78 | Temp 97.7°F | Ht 71.0 in | Wt 176.0 lb

## 2012-05-29 DIAGNOSIS — I10 Essential (primary) hypertension: Secondary | ICD-10-CM

## 2012-05-29 DIAGNOSIS — Z9049 Acquired absence of other specified parts of digestive tract: Secondary | ICD-10-CM

## 2012-05-29 DIAGNOSIS — F17200 Nicotine dependence, unspecified, uncomplicated: Secondary | ICD-10-CM

## 2012-05-29 NOTE — Assessment & Plan Note (Signed)
Has cut back significantly. Encouraged to quit.Marland KitchenMarland Kitchen

## 2012-05-29 NOTE — Addendum Note (Signed)
Addended by: HATCHER, JEFFREY C on: 05/29/2012 09:53 AM   Modules accepted: Orders

## 2012-05-29 NOTE — Assessment & Plan Note (Signed)
He's doing very well. Taking art without issues. vax up to date. Offered/refused condoms.

## 2012-05-29 NOTE — Assessment & Plan Note (Signed)
Doing well on ACE-I 

## 2012-05-29 NOTE — Progress Notes (Signed)
  Subjective:    Patient ID: John Farrell, male    DOB: October 24, 1970, 42 y.o.   MRN: [id number]  HPI 42 yo M with hx HIV+, mild hyperlipidemia, HTN, depression.  Has had cholecystectomy for his abd pain (sept 2013). Having a lot of burping, gas, flank pain. Worse when he is cold. Felt well when he was in [place]. Since coming back has had some occasional heaviness.  Recent lipids were 203/207, non-fasting.   HIV 1 RNA Quant (copies/mL)  Date Value  05/15/2012 <20   11/15/2011 <20   05/10/2011 <20      CD4 T Cell Abs (cmm)  Date Value  05/15/2012 360*  11/15/2011 380*  05/10/2011 390*       Review of Systems  Constitutional: Negative for fever, chills, appetite change and unexpected weight change.  Cardiovascular: Negative for chest pain.  Gastrointestinal: Positive for abdominal pain. Negative for diarrhea and constipation.  Genitourinary: Negative for difficulty urinating.  Neurological: Negative for headaches.       Objective:   Physical Exam  Constitutional: He appears well-developed and well-nourished.  HENT:  Mouth/Throat: No oropharyngeal exudate.  Eyes: EOM are normal. Pupils are equal, round, and reactive to light.  Neck: Neck supple.  Cardiovascular: Normal rate, regular rhythm and normal heart sounds.   Pulmonary/Chest: Effort normal and breath sounds normal.  Abdominal: Soft. Bowel sounds are normal. There is no tenderness. There is no rebound.  Lymphadenopathy:    He has no cervical adenopathy.          Assessment & Plan:

## 2012-05-29 NOTE — Assessment & Plan Note (Signed)
Character has changed with cholecystectomy. improved somewhat. Has improved his diet.

## 2012-05-29 NOTE — Assessment & Plan Note (Addendum)
His lipids are very close to nl. Will cont to monitor. Cont fibrate.

## 2012-11-12 ENCOUNTER — Other Ambulatory Visit: Payer: Self-pay | Admitting: Infectious Diseases

## 2012-11-14 ENCOUNTER — Other Ambulatory Visit: Payer: PRIVATE HEALTH INSURANCE

## 2012-11-14 DIAGNOSIS — E781 Pure hyperglyceridemia: Secondary | ICD-10-CM

## 2012-11-14 DIAGNOSIS — B20 Human immunodeficiency virus [HIV] disease: Secondary | ICD-10-CM

## 2012-11-15 ENCOUNTER — Other Ambulatory Visit: Payer: Self-pay | Admitting: *Deleted

## 2012-11-15 DIAGNOSIS — B20 Human immunodeficiency virus [HIV] disease: Secondary | ICD-10-CM

## 2012-11-15 LAB — CBC
HCT: 43.5 % (ref 39.0–52.0)
Hemoglobin: 14.9 g/dL (ref 13.0–17.0)
MCV: 86.5 fL (ref 78.0–100.0)
RDW: 14.9 % (ref 11.5–15.5)
WBC: 5.4 10*3/uL (ref 4.0–10.5)

## 2012-11-15 LAB — COMPREHENSIVE METABOLIC PANEL
AST: 27 U/L (ref 0–37)
BUN: 12 mg/dL (ref 6–23)
CO2: 26 mEq/L (ref 19–32)
Calcium: 9.5 mg/dL (ref 8.4–10.5)
Chloride: 101 mEq/L (ref 96–112)
Creat: 0.7 mg/dL (ref 0.50–1.35)
Glucose, Bld: 86 mg/dL (ref 70–99)

## 2012-11-15 LAB — LIPID PANEL
Cholesterol: 168 mg/dL (ref 0–200)
HDL: 32 mg/dL — ABNORMAL LOW (ref >39)
Total CHOL/HDL Ratio: 5.3 Ratio
VLDL: 35 mg/dL (ref 0–40)

## 2012-11-15 LAB — HEPATITIS B SURFACE ANTIBODY,QUALITATIVE: Hep B S Ab: NEGATIVE

## 2012-11-15 MED ORDER — EFAVIRENZ-EMTRICITAB-TENOFOVIR 600-200-300 MG PO TABS: ORAL_TABLET | ORAL | Status: DC

## 2012-11-20 ENCOUNTER — Other Ambulatory Visit: Payer: PRIVATE HEALTH INSURANCE

## 2012-12-04 ENCOUNTER — Ambulatory Visit (INDEPENDENT_AMBULATORY_CARE_PROVIDER_SITE_OTHER): Payer: PRIVATE HEALTH INSURANCE | Admitting: Infectious Diseases

## 2012-12-04 ENCOUNTER — Encounter: Payer: Self-pay | Admitting: Infectious Diseases

## 2012-12-04 VITALS — BP 113/74 | HR 76 | Temp 98.3°F | Ht 71.0 in | Wt 165.0 lb

## 2012-12-04 DIAGNOSIS — M25519 Pain in unspecified shoulder: Secondary | ICD-10-CM

## 2012-12-04 DIAGNOSIS — M25512 Pain in left shoulder: Secondary | ICD-10-CM

## 2012-12-04 DIAGNOSIS — Z113 Encounter for screening for infections with a predominantly sexual mode of transmission: Secondary | ICD-10-CM

## 2012-12-04 DIAGNOSIS — B20 Human immunodeficiency virus [HIV] disease: Secondary | ICD-10-CM

## 2012-12-04 DIAGNOSIS — Z23 Encounter for immunization: Secondary | ICD-10-CM

## 2012-12-04 DIAGNOSIS — R109 Unspecified abdominal pain: Secondary | ICD-10-CM

## 2012-12-04 DIAGNOSIS — E781 Pure hyperglyceridemia: Secondary | ICD-10-CM

## 2012-12-04 MED ORDER — EFAVIRENZ-EMTRICITAB-TENOFOVIR 600-200-300 MG PO TABS: ORAL_TABLET | ORAL | Status: DC

## 2012-12-04 NOTE — Assessment & Plan Note (Signed)
Would suggest related to stress/anxiety. He states this rotates posterior to anterior. Will have him eval by [facility] (has risk factors- HTN, lipids, HIV) for stress test.

## 2012-12-04 NOTE — Assessment & Plan Note (Signed)
Doing very well. Cont to monitor.

## 2012-12-04 NOTE — Assessment & Plan Note (Signed)
Doing very well. Will continue his current meds. Offered/refused condoms. partner is interested in [place]. I suggested pt check with his insurance company if they would cover this, then I would be glad to see him in clinic. Gets flu shot. Will see him back in 6 months.

## 2012-12-04 NOTE — Progress Notes (Signed)
  Subjective:    Patient ID: John Farrell, male    DOB: 12-Jan-1971, 42 y.o.   MRN: [id number]  HPI 42 yo M with hx HIV+, mild hyperlipidemia, HTN, depression. On atripla,  fibrate and ACE-I. Has quit smoking.  Has had cholecystectomy for his abd pain (sept 2013). For colonoscopy at end of month, nervous about this. Continues to have GI upset, stomach pain resolved. Queries if related to anxiety (IBS)?  HIV 1 RNA Quant (copies/mL)  Date Value  11/14/2012 <20   05/15/2012 <20   11/15/2011 <20      CD4 T Cell Abs (/uL)  Date Value  11/14/2012 460   05/15/2012 360*  11/15/2011 380*   Lab Results  Component Value Date   CHOL 168 11/14/2012   HDL 32* 11/14/2012   LDLCALC 101* 11/14/2012   TRIG 176* 11/14/2012   CHOLHDL 5.3 11/14/2012     Review of Systems  Constitutional: Negative for appetite change and unexpected weight change.  Respiratory: Negative for shortness of breath.   Cardiovascular: Negative for chest pain.  Gastrointestinal: Negative for abdominal pain, diarrhea and constipation.       Stomach cramps with stress.   Genitourinary: Negative for difficulty urinating.       Objective:   Physical Exam  Constitutional: He appears well-developed and well-nourished.  HENT:  Mouth/Throat: No oropharyngeal exudate.  Eyes: EOM are normal. Pupils are equal, round, and reactive to light.  Neck: Neck supple.  Cardiovascular: Normal rate, regular rhythm and normal heart sounds.   Pulmonary/Chest: Effort normal and breath sounds normal.  Abdominal: Soft. Bowel sounds are normal. He exhibits no distension. There is no tenderness.  Lymphadenopathy:    He has no cervical adenopathy.          Assessment & Plan:

## 2012-12-04 NOTE — Assessment & Plan Note (Signed)
Believe he has IBS. Will await completion of GI w/u, probably start on SSRI.

## 2012-12-05 ENCOUNTER — Encounter: Payer: Self-pay | Admitting: *Deleted

## 2012-12-06 ENCOUNTER — Institutional Professional Consult (permissible substitution): Payer: PRIVATE HEALTH INSURANCE | Admitting: Cardiovascular Disease

## 2013-01-06 ENCOUNTER — Encounter: Payer: Self-pay | Admitting: Cardiovascular Disease

## 2013-01-06 ENCOUNTER — Ambulatory Visit (INDEPENDENT_AMBULATORY_CARE_PROVIDER_SITE_OTHER): Payer: PRIVATE HEALTH INSURANCE | Admitting: Cardiovascular Disease

## 2013-01-06 VITALS — BP 138/80 | HR 74 | Wt 164.0 lb

## 2013-01-06 DIAGNOSIS — M25512 Pain in left shoulder: Secondary | ICD-10-CM

## 2013-01-06 DIAGNOSIS — M25519 Pain in unspecified shoulder: Secondary | ICD-10-CM

## 2013-01-06 DIAGNOSIS — R079 Chest pain, unspecified: Secondary | ICD-10-CM

## 2013-01-06 DIAGNOSIS — I10 Essential (primary) hypertension: Secondary | ICD-10-CM

## 2013-01-06 DIAGNOSIS — F17200 Nicotine dependence, unspecified, uncomplicated: Secondary | ICD-10-CM

## 2013-01-06 NOTE — Assessment & Plan Note (Signed)
Counseling given f/u Dr Ninetta Lights No wheezing on exam today

## 2013-01-06 NOTE — Assessment & Plan Note (Signed)
Well controlled.  Continue current medications and low sodium Dash type diet.    

## 2013-01-06 NOTE — Progress Notes (Signed)
Patient ID: John Farrell, male   DOB: [birth year], 42 y.o.   MRN: [id number] 42 yo M with hx HIV+, mild hyperlipidemia, HTN, depression. On atripla, fibrate and ACE-I. Atypical left oblique and axilla pain last few months.  Worse in humid weather Feels better during and after exercise. No lymphadenopathy  Nothing seems to make it better. Can get every few days intermitantly No pleuritic component No palpitaitons, dyspnea or syncope.  Compliant with meds. CD 4 count ok  No history of cardiac problems.  No fever cough or trauma No other arthritis or joint issues. Unfortuneatley is smoking less than 1 ppd again.  Counseled for less than 10 mintues. Currently less motivation to quit than when he saw Dr John Farrell    ROS: Denies fever, malais, weight loss, blurry vision, decreased visual acuity, cough, sputum, SOB, hemoptysis, pleuritic pain, palpitaitons, heartburn, abdominal pain, melena, lower extremity edema, claudication, or rash.  All other systems reviewed and negative   General: Affect appropriate Healthy:  appears stated age HEENT: normal Neck supple with no adenopathy JVP normal no bruits no thyromegaly Lungs clear with no wheezing and good diaphragmatic motion Heart:  S1/S2 no murmur,rub, gallop or click PMI normal Abdomen: benighn, BS positve, no tenderness, no AAA no bruit.  No HSM or HJR Distal pulses intact with no bruits No edema Neuro non-focal Skin warm and dry No muscular weakness  Medications Current Outpatient Prescriptions  Medication Sig Dispense Refill  . efavirenz-emtricitabine-tenofovir (ATRIPLA) 600-200-300 MG per tablet TAKE 1 TABLET AT BEDTIME  90 tablet  4  . fenofibrate 54 MG tablet TAKE 1 TABLET DAILY  90 tablet  1  . lisinopril (PRINIVIL,ZESTRIL) 5 MG tablet TAKE 1 TABLET (5 MG TOTAL) BY MOUTH DAILY.  90 tablet  10  . loratadine (CLARITIN) 10 MG tablet Take 10 mg by mouth daily.       No current facility-administered medications for this visit.     Allergies Review of patient's allergies indicates no known allergies.  Family History: Family History  Problem Relation Age of Onset  . Hypertension Mother   . Hypertension Father   . Colon polyps Mother     Social History: History   Social History  . Marital Status: Single    Spouse Name: N/A    Number of Children: N/A  . Years of Education: N/A   Occupational History  . Not on file.   Social History Main Topics  . Smoking status: Current Some Day Smoker    Last Attempt to Quit: 06/20/2008  . Smokeless tobacco: Never Used     Comment: pt. no longer smokes  . Alcohol Use: Yes     Comment: occasional wine  . Drug Use: No  . Sexual Activity: Yes    Partners: Male     Comment: pt. declined condoms   Other Topics Concern  . Not on file   Social History Narrative  . No narrative on file    Electrocardiogram:  SR rate 74 normal   Assessment and Plan

## 2013-01-06 NOTE — Patient Instructions (Signed)

## 2013-01-06 NOTE — Assessment & Plan Note (Signed)
CRF;s HTN, elevated lipids and smoking Atypical  ECG normal F/U ETT

## 2013-01-16 ENCOUNTER — Ambulatory Visit (HOSPITAL_COMMUNITY): Payer: PRIVATE HEALTH INSURANCE

## 2013-01-16 ENCOUNTER — Ambulatory Visit (INDEPENDENT_AMBULATORY_CARE_PROVIDER_SITE_OTHER): Payer: PRIVATE HEALTH INSURANCE | Admitting: Physician Assistant

## 2013-01-16 DIAGNOSIS — R079 Chest pain, unspecified: Secondary | ICD-10-CM

## 2013-01-16 NOTE — Progress Notes (Signed)
Exercise Treadmill Test  Pre-Exercise Testing Evaluation Rhythm: normal sinus  Rate: 77   PR:  .17 QRS:  .09  QT:  .36 QTc: .41           Test  Exercise Tolerance Test Ordering MD: Charlton Haws, MD  Interpreting MD: Tereso Newcomer, [facility]  Unique Test No: 1  Treadmill:  2  Indication for ETT: chest pain - rule out ischemia  Contraindication to ETT: No   Stress Modality: exercise - treadmill  Cardiac Imaging Performed: non   Protocol: standard Bruce - maximal  Max BP:  212/88  Max MPHR (bpm):  177 85% MPR (bpm):  150  MPHR obtained (bpm):  162 % MPHR obtained:  92  Reached 85% MPHR (min:sec):  9:36 Total Exercise Time (min-sec):  11:00  Workload in METS:  13.4 Borg Scale: 16  Reason ETT Terminated:  fatigue    ST Segment Analysis At Rest: normal ST segments - no evidence of significant ST depression With Exercise: no evidence of significant ST depression  Other Information Arrhythmia:  No Angina during ETT:  absent (0) Quality of ETT:  diagnostic  ETT Interpretation:  normal - no evidence of ischemia by ST analysis  Comments: Good exercise capacity. No chest pain. Normal BP response to exercise. No ST-T changes to suggest ischemia.   Recommendations: F/u with Dr. Charlton Haws as directed. Signed, Tereso Newcomer, PA-C   01/16/2013 3:33 PM

## 2013-02-17 ENCOUNTER — Other Ambulatory Visit: Payer: Self-pay | Admitting: Licensed Clinical Social Worker

## 2013-02-17 DIAGNOSIS — I10 Essential (primary) hypertension: Secondary | ICD-10-CM

## 2013-02-17 MED ORDER — LISINOPRIL 5 MG PO TABS: 5.0000 mg | ORAL_TABLET | Freq: Every day | ORAL | Status: DC

## 2013-04-09 ENCOUNTER — Other Ambulatory Visit: Payer: Self-pay | Admitting: Infectious Diseases

## 2013-05-20 ENCOUNTER — Other Ambulatory Visit: Payer: Self-pay | Admitting: *Deleted

## 2013-05-20 DIAGNOSIS — I10 Essential (primary) hypertension: Secondary | ICD-10-CM

## 2013-05-20 DIAGNOSIS — B20 Human immunodeficiency virus [HIV] disease: Secondary | ICD-10-CM

## 2013-05-20 DIAGNOSIS — E785 Hyperlipidemia, unspecified: Secondary | ICD-10-CM

## 2013-05-20 MED ORDER — FENOFIBRATE 54 MG PO TABS: 54.0000 mg | ORAL_TABLET | Freq: Every day | ORAL | Status: DC

## 2013-05-20 MED ORDER — EFAVIRENZ-EMTRICITAB-TENOFOVIR 600-200-300 MG PO TABS: ORAL_TABLET | ORAL | Status: DC

## 2013-05-20 MED ORDER — LISINOPRIL 5 MG PO TABS: 5.0000 mg | ORAL_TABLET | Freq: Every day | ORAL | Status: DC

## 2013-06-03 ENCOUNTER — Other Ambulatory Visit: Payer: Self-pay | Admitting: *Deleted

## 2013-06-03 ENCOUNTER — Other Ambulatory Visit: Payer: PRIVATE HEALTH INSURANCE

## 2013-06-03 ENCOUNTER — Telehealth: Payer: Self-pay | Admitting: *Deleted

## 2013-06-03 DIAGNOSIS — B20 Human immunodeficiency virus [HIV] disease: Secondary | ICD-10-CM

## 2013-06-03 DIAGNOSIS — Z113 Encounter for screening for infections with a predominantly sexual mode of transmission: Secondary | ICD-10-CM

## 2013-06-03 DIAGNOSIS — E781 Pure hyperglyceridemia: Secondary | ICD-10-CM

## 2013-06-03 LAB — CBC WITH DIFFERENTIAL/PLATELET
BASOS ABS: 0 10*3/uL (ref 0.0–0.1)
BASOS PCT: 0 % (ref 0–1)
Eosinophils Absolute: 0.1 10*3/uL (ref 0.0–0.7)
Eosinophils Relative: 2 % (ref 0–5)
HCT: 43.9 % (ref 39.0–52.0)
Hemoglobin: 15.3 g/dL (ref 13.0–17.0)
Lymphocytes Relative: 38 % (ref 12–46)
Lymphs Abs: 2.2 10*3/uL (ref 0.7–4.0)
MCH: 30.1 pg (ref 26.0–34.0)
MCHC: 34.9 g/dL (ref 30.0–36.0)
MCV: 86.4 fL (ref 78.0–100.0)
MONO ABS: 0.4 10*3/uL (ref 0.1–1.0)
Monocytes Relative: 7 % (ref 3–12)
NEUTROS ABS: 3 10*3/uL (ref 1.7–7.7)
NEUTROS PCT: 53 % (ref 43–77)
PLATELETS: 233 10*3/uL (ref 150–400)
RBC: 5.08 MIL/uL (ref 4.22–5.81)
RDW: 14.1 % (ref 11.5–15.5)
WBC: 5.7 10*3/uL (ref 4.0–10.5)

## 2013-06-03 LAB — COMPLETE METABOLIC PANEL WITH GFR
ALBUMIN: 4.3 g/dL (ref 3.5–5.2)
ALK PHOS: 57 U/L (ref 39–117)
ALT: 24 U/L (ref 0–53)
AST: 26 U/L (ref 0–37)
BUN: 15 mg/dL (ref 6–23)
CO2: 28 mEq/L (ref 19–32)
Calcium: 9.2 mg/dL (ref 8.4–10.5)
Chloride: 102 mEq/L (ref 96–112)
Creat: 0.82 mg/dL (ref 0.50–1.35)
GFR, Est African American: >89 mL/min
GFR, Est Non African American: >89 mL/min
Glucose, Bld: 102 mg/dL — ABNORMAL HIGH (ref 70–99)
POTASSIUM: 4.6 meq/L (ref 3.5–5.3)
SODIUM: 137 meq/L (ref 135–145)
Total Bilirubin: 0.3 mg/dL (ref 0.2–1.2)
Total Protein: 6.8 g/dL (ref 6.0–8.3)

## 2013-06-03 LAB — LIPID PANEL
CHOL/HDL RATIO: 5.5 ratio
CHOLESTEROL: 180 mg/dL (ref 0–200)
HDL: 33 mg/dL — AB (ref >39)
LDL Cholesterol: 100 mg/dL — ABNORMAL HIGH (ref 0–99)
TRIGLYCERIDES: 235 mg/dL — AB (ref <150)
VLDL: 47 mg/dL — AB (ref 0–40)

## 2013-06-03 LAB — RPR

## 2013-06-03 NOTE — Telephone Encounter (Signed)
Patient came in for labs and wanted to be sure that his Atripla, fenofibrate, and lisinopril were sent to new pharmacy [facility]. These were sent electronically on 05/20/13, called the pharmacy and left it on their voicemail because he was never contacted by the pharmacy saying they had received the Surescript. Wendall MolaJacqueline Cockerham

## 2013-06-04 LAB — HIV-1 RNA QUANT-NO REFLEX-BLD: HIV 1 RNA Quant: <20 copies/mL (ref <20)

## 2013-06-04 LAB — T-HELPER CELL (CD4) - (RCID CLINIC ONLY)
CD4 % Helper T Cell: 18 % — ABNORMAL LOW (ref 33–55)
CD4 T Cell Abs: 390 /uL — ABNORMAL LOW (ref 400–2700)

## 2013-06-07 ENCOUNTER — Other Ambulatory Visit: Payer: Self-pay | Admitting: Infectious Diseases

## 2013-06-09 ENCOUNTER — Other Ambulatory Visit: Payer: Self-pay | Admitting: *Deleted

## 2013-06-09 DIAGNOSIS — B20 Human immunodeficiency virus [HIV] disease: Secondary | ICD-10-CM

## 2013-06-12 ENCOUNTER — Other Ambulatory Visit: Payer: Self-pay | Admitting: *Deleted

## 2013-06-12 NOTE — Telephone Encounter (Signed)
[facility] did not receive [facility] refill for these rxes.  Will give verbally today.  [facility] found the pt had two profiles.  Medications were actually sent out yesterday.   Informed pt that refills were mailed out yesterday

## 2013-06-18 ENCOUNTER — Ambulatory Visit (INDEPENDENT_AMBULATORY_CARE_PROVIDER_SITE_OTHER): Payer: PRIVATE HEALTH INSURANCE | Admitting: Infectious Diseases

## 2013-06-18 ENCOUNTER — Encounter: Payer: Self-pay | Admitting: Infectious Diseases

## 2013-06-18 VITALS — BP 130/78 | HR 75 | Temp 98.3°F | Ht 71.0 in | Wt 172.0 lb

## 2013-06-18 DIAGNOSIS — E781 Pure hyperglyceridemia: Secondary | ICD-10-CM

## 2013-06-18 DIAGNOSIS — I10 Essential (primary) hypertension: Secondary | ICD-10-CM

## 2013-06-18 DIAGNOSIS — Z23 Encounter for immunization: Secondary | ICD-10-CM

## 2013-06-18 DIAGNOSIS — E785 Hyperlipidemia, unspecified: Secondary | ICD-10-CM

## 2013-06-18 DIAGNOSIS — B20 Human immunodeficiency virus [HIV] disease: Secondary | ICD-10-CM

## 2013-06-18 MED ORDER — LISINOPRIL 5 MG PO TABS: 5.0000 mg | ORAL_TABLET | Freq: Every day | ORAL | Status: DC

## 2013-06-18 MED ORDER — FENOFIBRATE 54 MG PO TABS: 54.0000 mg | ORAL_TABLET | Freq: Every day | ORAL | Status: DC

## 2013-06-18 NOTE — Assessment & Plan Note (Signed)
Will cont his ACE-I. Will see if he can get generic.

## 2013-06-18 NOTE — Assessment & Plan Note (Signed)
Will continue fibrate. He has improved.

## 2013-06-18 NOTE — Assessment & Plan Note (Signed)
He is doing well. Discussed with him about chaging his ART however he wishes to stay on his current art. Will re-start Hep B series since his Ab was (-) 2014. Offered/refused condoms. will see him back in 6 months.

## 2013-06-18 NOTE — Addendum Note (Signed)
Addended by: Wendall MolaOCKERHAM, Kevron Patella A on: 06/18/2013 10:08 AM   Modules accepted: Orders

## 2013-06-18 NOTE — Progress Notes (Signed)
   Subjective:    Patient ID: John Farrell, male    DOB: May 16, 1970, 43 y.o.   MRN: [id number]  HPI 43 yo M with hx HIV+, mild hyperlipidemia, HTN, depression. On atripla, fibrate and lisinopril.  Was seen by CV for GXT and had normal exam ("A-").  Met with him with pharm and discussed changing his ART to improve his lipids.   HIV 1 RNA Quant (copies/mL)  Date Value  06/03/2013 <20   11/14/2012 <20   05/15/2012 <20      CD4 T Cell Abs (/uL)  Date Value  06/03/2013 390*  11/14/2012 460   05/15/2012 360*   Lab Results  Component Value Date   CHOL 180 06/03/2013   HDL 33* 06/03/2013   LDLCALC 100* 06/03/2013   TRIG 235* 06/03/2013   CHOLHDL 5.5 06/03/2013     Review of Systems  Constitutional: Negative for appetite change and unexpected weight change.  Gastrointestinal: Negative for diarrhea and constipation.  Genitourinary: Negative for difficulty urinating.       Objective:   Physical Exam  Constitutional: He appears well-developed and well-nourished.  HENT:  Mouth/Throat: No oropharyngeal exudate.  Eyes: EOM are normal. Pupils are equal, round, and reactive to light.  Neck: Neck supple.  Cardiovascular: Normal rate, regular rhythm and normal heart sounds.   Pulmonary/Chest: Effort normal and breath sounds normal.  Abdominal: Soft. Bowel sounds are normal. There is no tenderness.  Lymphadenopathy:    He has no cervical adenopathy.          Assessment & Plan:

## 2013-06-18 NOTE — Progress Notes (Signed)
Patient ID: John AsalKevin D Strnad, male   DOB: 02/05/1971, 43 y.o.   MRN: [id number] HPI: John Farrell is a 43 y.o. male with HIV who is here for a follow up visit.  Allergies: No Known Allergies  Vitals: Temp: 98.3 F (36.8 C) (03/25 0847) Temp src: Oral (03/25 0847) BP: 130/78 mmHg (03/25 0847) Pulse Rate: 75 (03/25 0847)  Past Medical History: Past Medical History  Diagnosis Date  . Hypertension   . Hyperlipidemia   . HIV infection   . SYPHILIS   . HYPERTRIGLYCERIDEMIA   . DEPRESSION, MILD   . UNSPECIFIED NEURALGIA NEURITIS AND RADICULITIS   . Sinusitis   . Abdominal pain     Social History: History   Social History  . Marital Status: Single    Spouse Name: N/A    Number of Children: N/A  . Years of Education: N/A   Social History Main Topics  . Smoking status: Current Every Day Smoker -- 0.25 packs/day    Types: Cigarettes  . Smokeless tobacco: Never Used  . Alcohol Use: Yes     Comment: occasional wine  . Drug Use: No  . Sexual Activity: Yes    Partners: Male     Comment: pt. declined condoms   Other Topics Concern  . None   Social History Narrative  . None    Previous Regimen:   Current Regimen: Atripla  Labs: HIV 1 RNA Quant (copies/mL)  Date Value  06/03/2013 <20   11/14/2012 <20   05/15/2012 <20      CD4 T Cell Abs (/uL)  Date Value  06/03/2013 390*  11/14/2012 460   05/15/2012 360*     Hep B S Ab (no units)  Date Value  11/14/2012 NEG      Hepatitis B Surface Ag (no units)  Date Value  05/21/2006 No      HCV Ab (no units)  Date Value  05/21/2006 No     CrCl: Estimated Creatinine Clearance: 123.7 ml/min (by C-G formula based on Cr of 0.82).  Lipids:    Component Value Date/Time   CHOL 180 06/03/2013 1012   TRIG 235* 06/03/2013 1012   HDL 33* 06/03/2013 1012   CHOLHDL 5.5 06/03/2013 1012   VLDL 47* 06/03/2013 1012   LDLCALC 100* 06/03/2013 1012    Assessment: He has been very well controlled on Atripla. His lipids are  somewhat elevated though. I did talk to him about atripla could cause some of this. He is happy with his current regimen and is ok to remain on it. He is on Tricor for his triglycerides.  Recommendations: Cont Atripla  Clide CliffPham, Lee-Ann Gal Quang, PharmD Clinical Infectious Disease Pharmacist [facility] 06/18/2013, 1:17 PM

## 2013-07-17 ENCOUNTER — Ambulatory Visit (INDEPENDENT_AMBULATORY_CARE_PROVIDER_SITE_OTHER): Payer: PRIVATE HEALTH INSURANCE | Admitting: *Deleted

## 2013-07-17 DIAGNOSIS — Z23 Encounter for immunization: Secondary | ICD-10-CM

## 2013-12-10 ENCOUNTER — Other Ambulatory Visit: Payer: PRIVATE HEALTH INSURANCE

## 2013-12-10 DIAGNOSIS — B20 Human immunodeficiency virus [HIV] disease: Secondary | ICD-10-CM

## 2013-12-10 DIAGNOSIS — E781 Pure hyperglyceridemia: Secondary | ICD-10-CM

## 2013-12-10 DIAGNOSIS — Z113 Encounter for screening for infections with a predominantly sexual mode of transmission: Secondary | ICD-10-CM

## 2013-12-10 LAB — COMPREHENSIVE METABOLIC PANEL
ALK PHOS: 65 U/L (ref 39–117)
ALT: 21 U/L (ref 0–53)
AST: 24 U/L (ref 0–37)
Albumin: 4.5 g/dL (ref 3.5–5.2)
BILIRUBIN TOTAL: 0.4 mg/dL (ref 0.2–1.2)
BUN: 17 mg/dL (ref 6–23)
CO2: 21 meq/L (ref 19–32)
CREATININE: 0.93 mg/dL (ref 0.50–1.35)
Calcium: 9.9 mg/dL (ref 8.4–10.5)
Chloride: 102 mEq/L (ref 96–112)
GLUCOSE: 97 mg/dL (ref 70–99)
Potassium: 4.6 mEq/L (ref 3.5–5.3)
SODIUM: 138 meq/L (ref 135–145)
TOTAL PROTEIN: 7.4 g/dL (ref 6.0–8.3)

## 2013-12-10 LAB — CBC
HEMATOCRIT: 43.7 % (ref 39.0–52.0)
Hemoglobin: 15.4 g/dL (ref 13.0–17.0)
MCH: 30.4 pg (ref 26.0–34.0)
MCHC: 35.2 g/dL (ref 30.0–36.0)
MCV: 86.2 fL (ref 78.0–100.0)
PLATELETS: 251 10*3/uL (ref 150–400)
RBC: 5.07 MIL/uL (ref 4.22–5.81)
RDW: 14.1 % (ref 11.5–15.5)
WBC: 6.3 10*3/uL (ref 4.0–10.5)

## 2013-12-10 LAB — LIPID PANEL
CHOL/HDL RATIO: 5.1 ratio
CHOLESTEROL: 185 mg/dL (ref 0–200)
HDL: 36 mg/dL — AB (ref >39)
LDL Cholesterol: 112 mg/dL — ABNORMAL HIGH (ref 0–99)
Triglycerides: 185 mg/dL — ABNORMAL HIGH (ref <150)
VLDL: 37 mg/dL (ref 0–40)

## 2013-12-11 LAB — T-HELPER CELL (CD4) - (RCID CLINIC ONLY)
CD4 T CELL HELPER: 19 % — AB (ref 33–55)
CD4 T Cell Abs: 370 /uL — ABNORMAL LOW (ref 400–2700)

## 2013-12-11 LAB — HIV-1 RNA QUANT-NO REFLEX-BLD
HIV 1 RNA Quant: <20 copies/mL (ref <20)
HIV-1 RNA Quant, Log: <1.3 {Log} (ref <1.30)

## 2013-12-24 ENCOUNTER — Ambulatory Visit (INDEPENDENT_AMBULATORY_CARE_PROVIDER_SITE_OTHER): Payer: PRIVATE HEALTH INSURANCE | Admitting: Infectious Diseases

## 2013-12-24 ENCOUNTER — Encounter: Payer: Self-pay | Admitting: Infectious Diseases

## 2013-12-24 VITALS — BP 128/77 | HR 81 | Temp 98.1°F | Ht 71.0 in | Wt 168.0 lb

## 2013-12-24 DIAGNOSIS — Z23 Encounter for immunization: Secondary | ICD-10-CM

## 2013-12-24 DIAGNOSIS — B20 Human immunodeficiency virus [HIV] disease: Secondary | ICD-10-CM

## 2013-12-24 DIAGNOSIS — E781 Pure hyperglyceridemia: Secondary | ICD-10-CM

## 2013-12-24 DIAGNOSIS — Z79899 Other long term (current) drug therapy: Secondary | ICD-10-CM

## 2013-12-24 DIAGNOSIS — Z113 Encounter for screening for infections with a predominantly sexual mode of transmission: Secondary | ICD-10-CM

## 2013-12-24 DIAGNOSIS — F17200 Nicotine dependence, unspecified, uncomplicated: Secondary | ICD-10-CM

## 2013-12-24 DIAGNOSIS — I10 Essential (primary) hypertension: Secondary | ICD-10-CM

## 2013-12-24 NOTE — Assessment & Plan Note (Signed)
BP well controlled, he will cont his current rx

## 2013-12-24 NOTE — Assessment & Plan Note (Signed)
Much better today. Cont fibrate.

## 2013-12-24 NOTE — Addendum Note (Signed)
Addended by: Andree CossHOWELL, Callen Zuba M on: 12/24/2013 12:13 PM   Modules accepted: Orders

## 2013-12-24 NOTE — Assessment & Plan Note (Signed)
He is doing well. He gets 3rd hep B today. Gets flu shot today. Offered/refuses condoms today. Will see him back in 6 months.  We spoke about the yellowing area on his tongue, probably smoking related. No lesions seen.

## 2013-12-24 NOTE — Progress Notes (Signed)
   Subjective:    Patient ID: John Farrell, male    DOB: 01/13/1971, 43 y.o.   MRN: [id number]  HPI 43 yo M with hx HIV+, mild hyperlipidemia, HTN, depression. On atripla, fibrate and lisinopril.  Was seen by CV for GXT and had normal exam ("A-").  Was seen 06-18-13 and we discussed changing his ART to improve his lipids (he deferred). He did restart Hep B series at that time.   HIV 1 RNA Quant (copies/mL)  Date Value  12/10/2013 <20   06/03/2013 <20   11/14/2012 <20      CD4 T Cell Abs (/uL)  Date Value  12/10/2013 370*  06/03/2013 390*  11/14/2012 460    Lab Results  Component Value Date   CHOL 185 12/10/2013   HDL 36* 12/10/2013   LDLCALC 112* 12/10/2013   TRIG 185* 12/10/2013   CHOLHDL 5.1 12/10/2013    Has been doing well. 2 issues today- Has been having more chest congestion, worse in AM. Still smoking (1/2 ppd). Working on quitting.  Has noted on back of his tongue that he has gets yellow film coating, recurring. Has tried tongue cleaner which did not help. Comes off with tooth brush.   Review of Systems  Constitutional: Negative for appetite change and unexpected weight change.  Respiratory:       Chest congestion.   Gastrointestinal: Negative for diarrhea and constipation.  Genitourinary: Negative for difficulty urinating.       Objective:   Physical Exam  Constitutional: He appears well-developed and well-nourished.  HENT:  Mouth/Throat: No oropharyngeal exudate.  Eyes: EOM are normal. Pupils are equal, round, and reactive to light.  Neck: Neck supple.  Cardiovascular: Normal rate, regular rhythm and normal heart sounds.   Pulmonary/Chest: Effort normal and breath sounds normal.  Abdominal: Soft. Bowel sounds are normal. There is no tenderness. There is no rebound.  Lymphadenopathy:    He has no cervical adenopathy.          Assessment & Plan:

## 2013-12-24 NOTE — Assessment & Plan Note (Signed)
He is working on quitting. We discussed strategies to quit.

## 2014-04-09 ENCOUNTER — Other Ambulatory Visit: Payer: Self-pay | Admitting: Licensed Clinical Social Worker

## 2014-04-09 DIAGNOSIS — I10 Essential (primary) hypertension: Secondary | ICD-10-CM

## 2014-04-09 DIAGNOSIS — B20 Human immunodeficiency virus [HIV] disease: Secondary | ICD-10-CM

## 2014-04-09 MED ORDER — LISINOPRIL 5 MG PO TABS: 5.0000 mg | ORAL_TABLET | Freq: Every day | ORAL | Status: DC

## 2014-04-09 MED ORDER — FENOFIBRATE 54 MG PO TABS: 54.0000 mg | ORAL_TABLET | Freq: Every day | ORAL | Status: DC

## 2014-04-09 MED ORDER — EFAVIRENZ-EMTRICITAB-TENOFOVIR 600-200-300 MG PO TABS: ORAL_TABLET | ORAL | Status: DC

## 2014-06-10 ENCOUNTER — Other Ambulatory Visit: Payer: PRIVATE HEALTH INSURANCE

## 2014-06-10 DIAGNOSIS — Z79899 Other long term (current) drug therapy: Secondary | ICD-10-CM

## 2014-06-10 DIAGNOSIS — B20 Human immunodeficiency virus [HIV] disease: Secondary | ICD-10-CM

## 2014-06-10 DIAGNOSIS — Z113 Encounter for screening for infections with a predominantly sexual mode of transmission: Secondary | ICD-10-CM

## 2014-06-10 LAB — CBC
HEMATOCRIT: 45.5 % (ref 39.0–52.0)
HEMOGLOBIN: 15.4 g/dL (ref 13.0–17.0)
MCH: 29.6 pg (ref 26.0–34.0)
MCHC: 33.8 g/dL (ref 30.0–36.0)
MCV: 87.3 fL (ref 78.0–100.0)
MPV: 8.5 fL — AB (ref 8.6–12.4)
Platelets: 228 10*3/uL (ref 150–400)
RBC: 5.21 MIL/uL (ref 4.22–5.81)
RDW: 14.9 % (ref 11.5–15.5)
WBC: 5.3 10*3/uL (ref 4.0–10.5)

## 2014-06-10 LAB — LIPID PANEL
Cholesterol: 187 mg/dL (ref 0–200)
HDL: 33 mg/dL — AB (ref >=40)
LDL CALC: 129 mg/dL — AB (ref 0–99)
Total CHOL/HDL Ratio: 5.7 Ratio
Triglycerides: 127 mg/dL (ref <150)
VLDL: 25 mg/dL (ref 0–40)

## 2014-06-10 LAB — COMPREHENSIVE METABOLIC PANEL
ALBUMIN: 4.2 g/dL (ref 3.5–5.2)
ALK PHOS: 62 U/L (ref 39–117)
ALT: 22 U/L (ref 0–53)
AST: 28 U/L (ref 0–37)
BUN: 12 mg/dL (ref 6–23)
CO2: 25 meq/L (ref 19–32)
Calcium: 9.3 mg/dL (ref 8.4–10.5)
Chloride: 101 mEq/L (ref 96–112)
Creat: 0.89 mg/dL (ref 0.50–1.35)
GLUCOSE: 95 mg/dL (ref 70–99)
POTASSIUM: 4.8 meq/L (ref 3.5–5.3)
Sodium: 138 mEq/L (ref 135–145)
Total Bilirubin: 0.3 mg/dL (ref 0.2–1.2)
Total Protein: 6.8 g/dL (ref 6.0–8.3)

## 2014-06-11 LAB — T-HELPER CELL (CD4) - (RCID CLINIC ONLY)
CD4 % Helper T Cell: 19 % — ABNORMAL LOW (ref 33–55)
CD4 T Cell Abs: 350 /uL — ABNORMAL LOW (ref 400–2700)

## 2014-06-11 LAB — HIV-1 RNA QUANT-NO REFLEX-BLD: HIV 1 RNA Quant: <20 copies/mL (ref <20)

## 2014-06-11 LAB — FLUORESCENT TREPONEMAL AB(FTA)-IGG-BLD: Fluorescent Treponemal ABS: REACTIVE — AB

## 2014-06-11 LAB — RPR: RPR Ser Ql: REACTIVE — AB

## 2014-06-11 LAB — RPR TITER: RPR Titer: 1:8 {titer}

## 2014-06-24 ENCOUNTER — Ambulatory Visit: Payer: PRIVATE HEALTH INSURANCE | Admitting: Infectious Diseases

## 2014-06-29 ENCOUNTER — Encounter: Payer: Self-pay | Admitting: Infectious Diseases

## 2014-06-29 ENCOUNTER — Ambulatory Visit (INDEPENDENT_AMBULATORY_CARE_PROVIDER_SITE_OTHER): Payer: PRIVATE HEALTH INSURANCE | Admitting: Infectious Diseases

## 2014-06-29 VITALS — BP 124/84 | HR 71 | Temp 98.3°F | Ht 71.0 in | Wt 170.0 lb

## 2014-06-29 DIAGNOSIS — B20 Human immunodeficiency virus [HIV] disease: Secondary | ICD-10-CM

## 2014-06-29 DIAGNOSIS — I10 Essential (primary) hypertension: Secondary | ICD-10-CM

## 2014-06-29 DIAGNOSIS — Z79899 Other long term (current) drug therapy: Secondary | ICD-10-CM | POA: Diagnosis not present

## 2014-06-29 DIAGNOSIS — Z113 Encounter for screening for infections with a predominantly sexual mode of transmission: Secondary | ICD-10-CM

## 2014-06-29 DIAGNOSIS — E781 Pure hyperglyceridemia: Secondary | ICD-10-CM | POA: Diagnosis not present

## 2014-06-29 NOTE — Assessment & Plan Note (Signed)
Doing well on ACE-I

## 2014-06-29 NOTE — Assessment & Plan Note (Signed)
He is doing very well.  Will try to get him med assistance card Asked him if his partner (on prep) would like to go on actg.  Offered/refused condoms.  rtc 6 months.

## 2014-06-29 NOTE — Assessment & Plan Note (Signed)
Doing well with fibrate.

## 2014-06-29 NOTE — Progress Notes (Signed)
   Subjective:    Patient ID: John Farrell, male    DOB: 01/25/1971, 44 y.o.   MRN: [id number]  HPI 44 yo M with hx HIV+, mild hyperlipidemia, HTN, depression. On atripla, fibrate and lisinopril.  Was seen by CV for GXT and had normal exam ("A-").  Completed second Hep B series last year.   HIV 1 RNA QUANT (copies/mL)  Date Value  06/10/2014 <20  12/10/2013 <20  06/03/2013 <20   CD4 T CELL ABS (/uL)  Date Value  06/10/2014 350*  12/10/2013 370*  06/03/2013 390*   Lab Results  Component Value Date   CHOL 187 06/10/2014   HDL 33* 06/10/2014   LDLCALC 129* 06/10/2014   TRIG 127 06/10/2014   CHOLHDL 5.7 06/10/2014    Has been taking allegra for his seasonal allergies (oocasional allegra d). Has tried claritin which gives him multiple side effects, but relieves his allergies (also gives him urinary retention).   Review of Systems  Constitutional: Negative for appetite change.  Gastrointestinal: Negative for diarrhea and constipation.  Genitourinary: Negative for difficulty urinating.      Objective:   Physical Exam  Constitutional: He appears well-developed and well-nourished.  HENT:  Mouth/Throat: No oropharyngeal exudate.  Eyes: EOM are normal. Pupils are equal, round, and reactive to light.  Neck: Neck supple.  Cardiovascular: Normal rate, regular rhythm and normal heart sounds.   Pulmonary/Chest: Effort normal and breath sounds normal.  Abdominal: Soft. Bowel sounds are normal. He exhibits no distension. There is no tenderness.  Lymphadenopathy:    He has no cervical adenopathy.      Assessment & Plan:

## 2014-07-06 ENCOUNTER — Other Ambulatory Visit: Payer: Self-pay | Admitting: Licensed Clinical Social Worker

## 2014-07-06 DIAGNOSIS — B20 Human immunodeficiency virus [HIV] disease: Secondary | ICD-10-CM

## 2014-07-06 MED ORDER — EFAVIRENZ-EMTRICITAB-TENOFOVIR 600-200-300 MG PO TABS: ORAL_TABLET | ORAL | Status: DC

## 2014-08-31 ENCOUNTER — Other Ambulatory Visit: Payer: Self-pay | Admitting: Infectious Diseases

## 2014-08-31 DIAGNOSIS — E785 Hyperlipidemia, unspecified: Secondary | ICD-10-CM

## 2014-09-14 ENCOUNTER — Other Ambulatory Visit: Payer: Self-pay | Admitting: Infectious Diseases

## 2014-12-12 ENCOUNTER — Other Ambulatory Visit: Payer: Self-pay | Admitting: Infectious Diseases

## 2014-12-12 DIAGNOSIS — I10 Essential (primary) hypertension: Secondary | ICD-10-CM

## 2014-12-16 ENCOUNTER — Other Ambulatory Visit: Payer: PRIVATE HEALTH INSURANCE

## 2014-12-16 DIAGNOSIS — B20 Human immunodeficiency virus [HIV] disease: Secondary | ICD-10-CM

## 2014-12-16 DIAGNOSIS — Z113 Encounter for screening for infections with a predominantly sexual mode of transmission: Secondary | ICD-10-CM

## 2014-12-16 LAB — COMPREHENSIVE METABOLIC PANEL
ALBUMIN: 4.3 g/dL (ref 3.6–5.1)
ALT: 24 U/L (ref 9–46)
AST: 30 U/L (ref 10–40)
Alkaline Phosphatase: 61 U/L (ref 40–115)
BILIRUBIN TOTAL: 0.3 mg/dL (ref 0.2–1.2)
BUN: 13 mg/dL (ref 7–25)
CALCIUM: 9.3 mg/dL (ref 8.6–10.3)
CHLORIDE: 101 mmol/L (ref 98–110)
CO2: 29 mmol/L (ref 20–31)
Creat: 0.76 mg/dL (ref 0.60–1.35)
Glucose, Bld: 88 mg/dL (ref 65–99)
Potassium: 4.7 mmol/L (ref 3.5–5.3)
Sodium: 135 mmol/L (ref 135–146)
Total Protein: 6.6 g/dL (ref 6.1–8.1)

## 2014-12-16 LAB — CBC
HEMATOCRIT: 44.3 % (ref 39.0–52.0)
HEMOGLOBIN: 15.3 g/dL (ref 13.0–17.0)
MCH: 30.1 pg (ref 26.0–34.0)
MCHC: 34.5 g/dL (ref 30.0–36.0)
MCV: 87 fL (ref 78.0–100.0)
MPV: 8.6 fL (ref 8.6–12.4)
Platelets: 229 10*3/uL (ref 150–400)
RBC: 5.09 MIL/uL (ref 4.22–5.81)
RDW: 14.6 % (ref 11.5–15.5)
WBC: 5.8 10*3/uL (ref 4.0–10.5)

## 2014-12-17 LAB — RPR

## 2014-12-17 LAB — T-HELPER CELL (CD4) - (RCID CLINIC ONLY)
CD4 % Helper T Cell: 19 % — ABNORMAL LOW (ref 33–55)
CD4 T Cell Abs: 390 /uL — ABNORMAL LOW (ref 400–2700)

## 2014-12-17 LAB — HIV-1 RNA QUANT-NO REFLEX-BLD: HIV-1 RNA Quant, Log: <1.3 {Log} (ref <1.30)

## 2014-12-17 LAB — URINE CYTOLOGY ANCILLARY ONLY
Chlamydia: NEGATIVE
Neisseria Gonorrhea: NEGATIVE

## 2014-12-29 ENCOUNTER — Ambulatory Visit (INDEPENDENT_AMBULATORY_CARE_PROVIDER_SITE_OTHER): Payer: PRIVATE HEALTH INSURANCE | Admitting: Infectious Diseases

## 2014-12-29 ENCOUNTER — Encounter: Payer: Self-pay | Admitting: Infectious Diseases

## 2014-12-29 VITALS — BP 112/72 | HR 78 | Temp 97.4°F | Wt 172.0 lb

## 2014-12-29 DIAGNOSIS — E785 Hyperlipidemia, unspecified: Secondary | ICD-10-CM

## 2014-12-29 DIAGNOSIS — Z113 Encounter for screening for infections with a predominantly sexual mode of transmission: Secondary | ICD-10-CM

## 2014-12-29 DIAGNOSIS — E781 Pure hyperglyceridemia: Secondary | ICD-10-CM | POA: Diagnosis not present

## 2014-12-29 DIAGNOSIS — I10 Essential (primary) hypertension: Secondary | ICD-10-CM | POA: Diagnosis not present

## 2014-12-29 DIAGNOSIS — Z79899 Other long term (current) drug therapy: Secondary | ICD-10-CM

## 2014-12-29 DIAGNOSIS — Z23 Encounter for immunization: Secondary | ICD-10-CM | POA: Diagnosis not present

## 2014-12-29 DIAGNOSIS — B20 Human immunodeficiency virus [HIV] disease: Secondary | ICD-10-CM

## 2014-12-29 MED ORDER — FENOFIBRATE 54 MG PO TABS: 54.0000 mg | ORAL_TABLET | Freq: Every day | ORAL | Status: DC

## 2014-12-29 MED ORDER — LISINOPRIL 5 MG PO TABS: 5.0000 mg | ORAL_TABLET | Freq: Every day | ORAL | Status: DC

## 2014-12-29 NOTE — Assessment & Plan Note (Signed)
Will continue fibrate, LFTs normal.  refilled

## 2014-12-29 NOTE — Assessment & Plan Note (Signed)
Will cont ACE-I Well controlled.

## 2014-12-29 NOTE — Assessment & Plan Note (Signed)
He is doing well.  We discussed changing to newer ART but he wishes to stay on atripla.  He has gotten flu shot.  His hep A/B are uptotdate.  Offered/refused condoms.  Partner is on prep.  rtc 6 months

## 2014-12-29 NOTE — Progress Notes (Signed)
   Subjective:    Patient ID: John Farrell, male    DOB: 1970-09-29, 44 y.o.   MRN: [id number]  HPI 44 yo M with hx HIV+, mild hyperlipidemia, HTN, depression. On atripla, fibrate and lisinopril.  Was seen by CV for GXT and had normal exam ("A-").  Completed second Hep B series last year.   Has been feeling well. Has been feeling well. Sinuses have been mild this year (not taking allegra yet).  Has been doing well with his ART (has copay card questions). Husband doing well.   HIV 1 RNA QUANT (copies/mL)  Date Value  12/16/2014 <20  06/10/2014 <20  12/10/2013 <20   CD4 T CELL ABS (/uL)  Date Value  12/16/2014 390*  06/10/2014 350*  12/10/2013 370*    Review of Systems  Constitutional: Negative for fever, chills, appetite change and unexpected weight change.  HENT: Positive for rhinorrhea. Negative for sore throat.   Respiratory: Negative for shortness of breath.   Gastrointestinal: Negative for diarrhea and constipation.  Genitourinary: Negative for difficulty urinating.      Objective:   Physical Exam  Constitutional: He appears well-developed and well-nourished.  HENT:  Mouth/Throat: No oropharyngeal exudate.  Eyes: Pupils are equal, round, and reactive to light.  Neck: Neck supple.  Cardiovascular: Normal rate, regular rhythm and normal heart sounds.   Pulmonary/Chest: Effort normal and breath sounds normal.  Abdominal: Soft. There is no tenderness. There is no rebound.  Musculoskeletal: He exhibits no edema.  Lymphadenopathy:    He has no cervical adenopathy.        Assessment & Plan:

## 2015-06-22 ENCOUNTER — Other Ambulatory Visit: Payer: Self-pay | Admitting: *Deleted

## 2015-06-22 DIAGNOSIS — B20 Human immunodeficiency virus [HIV] disease: Secondary | ICD-10-CM

## 2015-06-22 MED ORDER — EFAVIRENZ-EMTRICITAB-TENOFOVIR 600-200-300 MG PO TABS: ORAL_TABLET | ORAL | Status: DC

## 2015-06-24 ENCOUNTER — Other Ambulatory Visit: Payer: Self-pay | Admitting: *Deleted

## 2015-06-24 DIAGNOSIS — B20 Human immunodeficiency virus [HIV] disease: Secondary | ICD-10-CM

## 2015-06-24 MED ORDER — EFAVIRENZ-EMTRICITAB-TENOFOVIR 600-200-300 MG PO TABS: ORAL_TABLET | ORAL | Status: DC

## 2015-07-06 ENCOUNTER — Other Ambulatory Visit: Payer: PRIVATE HEALTH INSURANCE

## 2015-07-06 DIAGNOSIS — E781 Pure hyperglyceridemia: Secondary | ICD-10-CM

## 2015-07-06 DIAGNOSIS — Z113 Encounter for screening for infections with a predominantly sexual mode of transmission: Secondary | ICD-10-CM

## 2015-07-06 DIAGNOSIS — E785 Hyperlipidemia, unspecified: Secondary | ICD-10-CM

## 2015-07-06 DIAGNOSIS — Z79899 Other long term (current) drug therapy: Secondary | ICD-10-CM

## 2015-07-06 DIAGNOSIS — B20 Human immunodeficiency virus [HIV] disease: Secondary | ICD-10-CM

## 2015-07-06 LAB — CBC
HEMATOCRIT: 42.6 % (ref 38.5–50.0)
Hemoglobin: 14.4 g/dL (ref 13.2–17.1)
MCH: 29.6 pg (ref 27.0–33.0)
MCHC: 33.8 g/dL (ref 32.0–36.0)
MCV: 87.5 fL (ref 80.0–100.0)
MPV: 8.4 fL (ref 7.5–12.5)
Platelets: 246 10*3/uL (ref 140–400)
RBC: 4.87 MIL/uL (ref 4.20–5.80)
RDW: 14.4 % (ref 11.0–15.0)
WBC: 6.3 10*3/uL (ref 3.8–10.8)

## 2015-07-07 ENCOUNTER — Other Ambulatory Visit: Payer: PRIVATE HEALTH INSURANCE

## 2015-07-07 LAB — LIPID PANEL
Cholesterol: 163 mg/dL (ref 125–200)
HDL: 24 mg/dL — AB (ref >=40)
LDL Cholesterol: 72 mg/dL (ref <130)
Total CHOL/HDL Ratio: 6.8 Ratio — ABNORMAL HIGH (ref <=5.0)
Triglycerides: 336 mg/dL — ABNORMAL HIGH (ref <150)
VLDL: 67 mg/dL — ABNORMAL HIGH (ref <30)

## 2015-07-07 LAB — COMPREHENSIVE METABOLIC PANEL
ALBUMIN: 4.1 g/dL (ref 3.6–5.1)
ALK PHOS: 60 U/L (ref 40–115)
ALT: 26 U/L (ref 9–46)
AST: 28 U/L (ref 10–40)
BILIRUBIN TOTAL: 0.2 mg/dL (ref 0.2–1.2)
BUN: 16 mg/dL (ref 7–25)
CALCIUM: 9.4 mg/dL (ref 8.6–10.3)
CO2: 26 mmol/L (ref 20–31)
Chloride: 102 mmol/L (ref 98–110)
Creat: 0.78 mg/dL (ref 0.60–1.35)
Glucose, Bld: 93 mg/dL (ref 65–99)
Potassium: 4.6 mmol/L (ref 3.5–5.3)
Sodium: 138 mmol/L (ref 135–146)
Total Protein: 6.6 g/dL (ref 6.1–8.1)

## 2015-07-07 LAB — FLUORESCENT TREPONEMAL AB(FTA)-IGG-BLD: FLUORESCENT TREPONEMAL ABS: REACTIVE — AB

## 2015-07-07 LAB — RPR: RPR: REACTIVE — AB

## 2015-07-07 LAB — RPR TITER: RPR Titer: 1:1 {titer}

## 2015-07-08 LAB — HIV-1 RNA QUANT-NO REFLEX-BLD
HIV 1 RNA QUANT: 24 {copies}/mL — AB (ref <20)
HIV-1 RNA QUANT, LOG: 1.38 {Log_copies}/mL — AB (ref <1.30)

## 2015-07-08 LAB — T-HELPER CELL (CD4) - (RCID CLINIC ONLY)
CD4 T CELL ABS: 540 /uL (ref 400–2700)
CD4 T CELL HELPER: 23 % — AB (ref 33–55)

## 2015-07-09 LAB — URINE CYTOLOGY ANCILLARY ONLY
Chlamydia: NEGATIVE
NEISSERIA GONORRHEA: NEGATIVE

## 2015-07-21 ENCOUNTER — Ambulatory Visit (INDEPENDENT_AMBULATORY_CARE_PROVIDER_SITE_OTHER): Payer: PRIVATE HEALTH INSURANCE | Admitting: Infectious Diseases

## 2015-07-21 ENCOUNTER — Encounter: Payer: Self-pay | Admitting: Infectious Diseases

## 2015-07-21 VITALS — BP 114/74 | HR 82 | Temp 97.7°F | Wt 170.0 lb

## 2015-07-21 DIAGNOSIS — A539 Syphilis, unspecified: Secondary | ICD-10-CM

## 2015-07-21 DIAGNOSIS — B20 Human immunodeficiency virus [HIV] disease: Secondary | ICD-10-CM

## 2015-07-21 DIAGNOSIS — E781 Pure hyperglyceridemia: Secondary | ICD-10-CM

## 2015-07-21 NOTE — Assessment & Plan Note (Addendum)
He has 1:1.  Has had no new partners since 05-2014. Was protected intercourse then.  Worries he may have gotten from hot tub. Re-assured him this was not the case.  Repeat in 6 months. Suspect false +

## 2015-07-21 NOTE — Assessment & Plan Note (Signed)
He is doing well, blip.  He is again offered more modern therapy- which he again defers.  Will see him back in 6 months, reepat RPR at that time as well.

## 2015-07-21 NOTE — Assessment & Plan Note (Signed)
Will cont lipid lowering agent.

## 2015-07-21 NOTE — Progress Notes (Signed)
   Subjective:    Patient ID: John Farrell, male    DOB: 01/09/1971, 45 y.o.   MRN: [id number]  HPI 45 yo M with hx HIV+, mild hyperlipidemia, HTN, depression. On atripla, fibrate and lisinopril.  Was seen by CV for GXT and had normal exam ("A-").  Completed second Hep B series last year.   Has continued concerns about sinus fullness. Is taking allegra without relief.  Feels like it improves when the sunsets. Worse after 1 hour of being at work. Works in [employer]ers at [employer]. Has not tried nasal inhalers.   HIV 1 RNA QUANT (copies/mL)  Date Value  07/06/2015 24*  12/16/2014 <20  06/10/2014 <20   CD4 T CELL ABS (/uL)  Date Value  07/06/2015 540  12/16/2014 390*  06/10/2014 350*    Lab Results  Component Value Date   CHOL 163 07/06/2015   HDL 24* 07/06/2015   LDLCALC 72 07/06/2015   TRIG 336* 07/06/2015   CHOLHDL 6.8* 07/06/2015      Review of Systems  Constitutional: Negative for fever and chills.  HENT: Positive for sinus pressure.   Respiratory: Negative for shortness of breath.   Cardiovascular: Negative for chest pain.  Gastrointestinal: Negative for diarrhea and constipation.  Genitourinary: Negative for dysuria.  Neurological: Negative for headaches.       Objective:   Physical Exam  Constitutional: He appears well-developed and well-nourished.  HENT:  Mouth/Throat: No oropharyngeal exudate.  Eyes: EOM are normal. Pupils are equal, round, and reactive to light.  Neck: Neck supple.  Cardiovascular: Normal rate, regular rhythm and normal heart sounds.   Pulmonary/Chest: Effort normal and breath sounds normal.  Abdominal: Soft. Bowel sounds are normal. There is no tenderness. There is no rebound.  Lymphadenopathy:    He has no cervical adenopathy.          Assessment & Plan:

## 2015-07-21 NOTE — Assessment & Plan Note (Signed)
Discussed with pharm, flonase ok.

## 2015-12-31 ENCOUNTER — Other Ambulatory Visit: Payer: Self-pay | Admitting: Infectious Diseases

## 2015-12-31 DIAGNOSIS — I10 Essential (primary) hypertension: Secondary | ICD-10-CM

## 2015-12-31 DIAGNOSIS — E785 Hyperlipidemia, unspecified: Secondary | ICD-10-CM

## 2016-01-19 ENCOUNTER — Other Ambulatory Visit: Payer: PRIVATE HEALTH INSURANCE

## 2016-01-19 ENCOUNTER — Ambulatory Visit (INDEPENDENT_AMBULATORY_CARE_PROVIDER_SITE_OTHER): Payer: PRIVATE HEALTH INSURANCE

## 2016-01-19 DIAGNOSIS — Z23 Encounter for immunization: Secondary | ICD-10-CM | POA: Diagnosis not present

## 2016-01-19 DIAGNOSIS — B20 Human immunodeficiency virus [HIV] disease: Secondary | ICD-10-CM

## 2016-01-19 DIAGNOSIS — A539 Syphilis, unspecified: Secondary | ICD-10-CM

## 2016-01-19 LAB — COMPREHENSIVE METABOLIC PANEL
ALBUMIN: 4.5 g/dL (ref 3.6–5.1)
ALT: 29 U/L (ref 9–46)
AST: 33 U/L (ref 10–40)
Alkaline Phosphatase: 53 U/L (ref 40–115)
BILIRUBIN TOTAL: 0.5 mg/dL (ref 0.2–1.2)
BUN: 14 mg/dL (ref 7–25)
CO2: 28 mmol/L (ref 20–31)
CREATININE: 0.87 mg/dL (ref 0.60–1.35)
Calcium: 9.5 mg/dL (ref 8.6–10.3)
Chloride: 100 mmol/L (ref 98–110)
GLUCOSE: 104 mg/dL — AB (ref 65–99)
Potassium: 4.3 mmol/L (ref 3.5–5.3)
SODIUM: 137 mmol/L (ref 135–146)
Total Protein: 7.3 g/dL (ref 6.1–8.1)

## 2016-01-19 LAB — CBC
HCT: 44.8 % (ref 38.5–50.0)
HEMOGLOBIN: 15.6 g/dL (ref 13.2–17.1)
MCH: 29.9 pg (ref 27.0–33.0)
MCHC: 34.8 g/dL (ref 32.0–36.0)
MCV: 86 fL (ref 80.0–100.0)
MPV: 8.2 fL (ref 7.5–12.5)
PLATELETS: 216 10*3/uL (ref 140–400)
RBC: 5.21 MIL/uL (ref 4.20–5.80)
RDW: 13.1 % (ref 11.0–15.0)
WBC: 5.1 10*3/uL (ref 3.8–10.8)

## 2016-01-20 LAB — RPR

## 2016-01-20 NOTE — Addendum Note (Signed)
Addended by: Mariea ClontsGREEN, Gareld Obrecht D on: 01/20/2016 04:01 PM   Modules accepted: Orders

## 2016-01-21 LAB — HIV-1 RNA QUANT-NO REFLEX-BLD

## 2016-01-21 LAB — T-HELPER CELL (CD4) - (RCID CLINIC ONLY)
CD4 T CELL ABS: 380 /uL — AB (ref 400–2700)
CD4 T CELL HELPER: 19 % — AB (ref 33–55)

## 2016-02-02 ENCOUNTER — Encounter: Payer: Self-pay | Admitting: Infectious Diseases

## 2016-02-02 ENCOUNTER — Ambulatory Visit (INDEPENDENT_AMBULATORY_CARE_PROVIDER_SITE_OTHER): Payer: PRIVATE HEALTH INSURANCE | Admitting: Infectious Diseases

## 2016-02-02 VITALS — Ht 71.0 in | Wt 175.0 lb

## 2016-02-02 DIAGNOSIS — F17200 Nicotine dependence, unspecified, uncomplicated: Secondary | ICD-10-CM | POA: Diagnosis not present

## 2016-02-02 DIAGNOSIS — K219 Gastro-esophageal reflux disease without esophagitis: Secondary | ICD-10-CM | POA: Diagnosis not present

## 2016-02-02 DIAGNOSIS — Z113 Encounter for screening for infections with a predominantly sexual mode of transmission: Secondary | ICD-10-CM

## 2016-02-02 DIAGNOSIS — E781 Pure hyperglyceridemia: Secondary | ICD-10-CM

## 2016-02-02 DIAGNOSIS — B20 Human immunodeficiency virus [HIV] disease: Secondary | ICD-10-CM | POA: Diagnosis not present

## 2016-02-02 NOTE — Progress Notes (Signed)
   Subjective:    Patient ID: John Farrell, male    DOB: 05/09/1970, 45 y.o.   MRN: [id number]  HPI 45 yo M with hx HIV+, mild hyperlipidemia, HTN, depression. On atripla, fibrate and lisinopril.  Was seen by CV for GXT and had normal exam ("A-").  Completed second Hep B series 2016.  Feeling well.  No problems with meds. Has gotten flu shot.  Quit smoking 4 months ago.  Has been started on nexium by PCP for GERD. His "unbalanced" feeling has resolved since then. His GERD is better but not resolved.   HIV 1 RNA Quant (copies/mL)  Date Value  01/19/2016 <20  07/06/2015 24 (H)  12/16/2014 <20   CD4 T Cell Abs (/uL)  Date Value  01/19/2016 380 (L)  07/06/2015 540  12/16/2014 390 (L)   Attributes his Glc of 104 at his last labs to halloween candy.   Review of Systems  Constitutional: Negative for appetite change and unexpected weight change.  Respiratory: Negative for shortness of breath.   Gastrointestinal: Negative for constipation and diarrhea.  Genitourinary: Negative for difficulty urinating.  Neurological: Negative for headaches.       Objective:   Physical Exam  Constitutional: He appears well-developed and well-nourished.  HENT:  Mouth/Throat: No oropharyngeal exudate.  Eyes: EOM are normal. Pupils are equal, round, and reactive to light.  Neck: Neck supple.  Cardiovascular: Normal rate, regular rhythm and normal heart sounds.   Pulmonary/Chest: Effort normal and breath sounds normal.  Abdominal: Soft. Bowel sounds are normal. There is no tenderness. There is no rebound.  Musculoskeletal: He exhibits no edema.  Lymphadenopathy:    He has no cervical adenopathy.          Assessment & Plan:

## 2016-02-02 NOTE — Assessment & Plan Note (Signed)
Seems to be doing well on nexium. If further sx, consider GI eval.

## 2016-02-02 NOTE — Assessment & Plan Note (Signed)
Has quit.  Encouraged him.

## 2016-02-02 NOTE — Assessment & Plan Note (Signed)
Will cont fibrate, f/u with PCP

## 2016-02-02 NOTE — Assessment & Plan Note (Signed)
Wishes to continue on atripla.  Has gotten flu shot.  Offered/refused condoms.  Will see him back in 6 months.

## 2016-05-24 ENCOUNTER — Other Ambulatory Visit: Payer: Self-pay | Admitting: *Deleted

## 2016-05-24 DIAGNOSIS — B20 Human immunodeficiency virus [HIV] disease: Secondary | ICD-10-CM

## 2016-05-24 MED ORDER — EFAVIRENZ-EMTRICITAB-TENOFOVIR 600-200-300 MG PO TABS: ORAL_TABLET | ORAL | 3 refills | Status: DC

## 2016-07-03 ENCOUNTER — Other Ambulatory Visit: Payer: Self-pay | Admitting: Infectious Diseases

## 2016-07-03 DIAGNOSIS — I10 Essential (primary) hypertension: Secondary | ICD-10-CM

## 2016-07-03 DIAGNOSIS — E785 Hyperlipidemia, unspecified: Secondary | ICD-10-CM

## 2016-08-02 ENCOUNTER — Other Ambulatory Visit (HOSPITAL_COMMUNITY)
Admission: RE | Admit: 2016-08-02 | Discharge: 2016-08-02 | Disposition: A | Discharge status: Home / Self Care | Payer: PRIVATE HEALTH INSURANCE | Source: Ambulatory Visit | Attending: Infectious Diseases | Admitting: Infectious Diseases

## 2016-08-02 ENCOUNTER — Other Ambulatory Visit: Payer: PRIVATE HEALTH INSURANCE

## 2016-08-02 DIAGNOSIS — Z113 Encounter for screening for infections with a predominantly sexual mode of transmission: Secondary | ICD-10-CM | POA: Diagnosis not present

## 2016-08-02 DIAGNOSIS — K219 Gastro-esophageal reflux disease without esophagitis: Secondary | ICD-10-CM

## 2016-08-02 DIAGNOSIS — B20 Human immunodeficiency virus [HIV] disease: Secondary | ICD-10-CM

## 2016-08-02 LAB — LIPID PANEL
CHOL/HDL RATIO: 6.6 ratio — AB (ref <5.0)
Cholesterol: 192 mg/dL (ref <200)
HDL: 29 mg/dL — ABNORMAL LOW (ref >40)
LDL Cholesterol: 116 mg/dL — ABNORMAL HIGH (ref <100)
Triglycerides: 237 mg/dL — ABNORMAL HIGH (ref <150)
VLDL: 47 mg/dL — AB (ref <30)

## 2016-08-02 LAB — COMPREHENSIVE METABOLIC PANEL
ALT: 27 U/L (ref 9–46)
AST: 29 U/L (ref 10–40)
Albumin: 4.2 g/dL (ref 3.6–5.1)
Alkaline Phosphatase: 59 U/L (ref 40–115)
BUN: 14 mg/dL (ref 7–25)
CHLORIDE: 102 mmol/L (ref 98–110)
CO2: 28 mmol/L (ref 20–31)
CREATININE: 0.94 mg/dL (ref 0.60–1.35)
Calcium: 9.2 mg/dL (ref 8.6–10.3)
GLUCOSE: 97 mg/dL (ref 65–99)
POTASSIUM: 4.6 mmol/L (ref 3.5–5.3)
SODIUM: 137 mmol/L (ref 135–146)
TOTAL PROTEIN: 6.9 g/dL (ref 6.1–8.1)
Total Bilirubin: 0.4 mg/dL (ref 0.2–1.2)

## 2016-08-02 LAB — CBC
HCT: 44.9 % (ref 38.5–50.0)
Hemoglobin: 15.2 g/dL (ref 13.2–17.1)
MCH: 29.2 pg (ref 27.0–33.0)
MCHC: 33.9 g/dL (ref 32.0–36.0)
MCV: 86.3 fL (ref 80.0–100.0)
MPV: 8.7 fL (ref 7.5–12.5)
Platelets: 248 10*3/uL (ref 140–400)
RBC: 5.2 MIL/uL (ref 4.20–5.80)
RDW: 14.5 % (ref 11.0–15.0)
WBC: 5.3 10*3/uL (ref 3.8–10.8)

## 2016-08-03 LAB — URINE CYTOLOGY ANCILLARY ONLY
Chlamydia: NEGATIVE
Neisseria Gonorrhea: NEGATIVE

## 2016-08-03 LAB — T-HELPER CELL (CD4) - (RCID CLINIC ONLY)
CD4 % Helper T Cell: 20 % — ABNORMAL LOW (ref 33–55)
CD4 T Cell Abs: 430 /uL (ref 400–2700)

## 2016-08-03 LAB — RPR

## 2016-08-04 LAB — HIV-1 RNA QUANT-NO REFLEX-BLD
HIV 1 RNA Quant: <20 copies/mL
HIV-1 RNA QUANT, LOG: NOT DETECTED {Log_copies}/mL

## 2016-08-16 ENCOUNTER — Ambulatory Visit (INDEPENDENT_AMBULATORY_CARE_PROVIDER_SITE_OTHER): Payer: PRIVATE HEALTH INSURANCE | Admitting: Infectious Diseases

## 2016-08-16 ENCOUNTER — Other Ambulatory Visit: Payer: Self-pay

## 2016-08-16 ENCOUNTER — Encounter: Payer: Self-pay | Admitting: Infectious Diseases

## 2016-08-16 VITALS — BP 130/79 | HR 74 | Temp 98.3°F | Ht 71.0 in | Wt 178.0 lb

## 2016-08-16 DIAGNOSIS — E781 Pure hyperglyceridemia: Secondary | ICD-10-CM

## 2016-08-16 DIAGNOSIS — B20 Human immunodeficiency virus [HIV] disease: Secondary | ICD-10-CM | POA: Diagnosis not present

## 2016-08-16 DIAGNOSIS — Z113 Encounter for screening for infections with a predominantly sexual mode of transmission: Secondary | ICD-10-CM

## 2016-08-16 DIAGNOSIS — Z23 Encounter for immunization: Secondary | ICD-10-CM

## 2016-08-16 DIAGNOSIS — F17200 Nicotine dependence, unspecified, uncomplicated: Secondary | ICD-10-CM

## 2016-08-16 DIAGNOSIS — K219 Gastro-esophageal reflux disease without esophagitis: Secondary | ICD-10-CM

## 2016-08-16 MED ORDER — OMEPRAZOLE 40 MG PO CPDR: 40.0000 mg | DELAYED_RELEASE_CAPSULE | Freq: Every day | ORAL | 12 refills | Status: DC

## 2016-08-16 NOTE — Addendum Note (Signed)
Addended by: Linnell FullingBRANNON, LATOYA N on: 08/16/2016 10:23 AM   Modules accepted: Orders

## 2016-08-16 NOTE — Assessment & Plan Note (Addendum)
Doing well Offered/refused condoms.  Mening vax today Tdap due as well.  rtc in 12 months.  Partner is on prep

## 2016-08-16 NOTE — Assessment & Plan Note (Signed)
Will refill his prilosec.  40mg  /day

## 2016-08-16 NOTE — Progress Notes (Signed)
   Subjective:    Patient ID: John Farrell, male    DOB: 01/12/1971, 46 y.o.   MRN: [id number]  HPI 46 yo M with hx HIV+, mild hyperlipidemia, HTN, depression. On atripla, fibrate and lisinopril.  Was seen by CV for GXT and had normal exam ("A-").  Completed second Hep B series 2016.  Quit smoking 2017 Prev started on nexium by PCP for GERD. He asks for refill today of prilosec. He took his partners 40mg  tabs and this improved his sx.    HIV 1 RNA Quant (copies/mL)  Date Value  08/02/2016 <20 NOT DETECTED  01/19/2016 <20  07/06/2015 24 (H)   CD4 T Cell Abs (/uL)  Date Value  08/02/2016 430  01/19/2016 380 (L)  07/06/2015 540    Review of Systems  Constitutional: Negative for appetite change and unexpected weight change.  Respiratory: Negative for cough and shortness of breath.   Gastrointestinal: Negative for abdominal pain, constipation and diarrhea.  Genitourinary: Negative for difficulty urinating.  BM are loose but not watery. QAMCarlyon Prows. Believes he eats enough fiber.  Has gained wt, attributes to not smoking. Trying to eat more veg's as snack.  Has not taken allegra all season.  Please see HPI. 12 point ROS o/w (-)     Objective:   Physical Exam  Constitutional: He appears well-developed and well-nourished.  Eyes: EOM are normal. Pupils are equal, round, and reactive to light.  Neck: Neck supple.  Cardiovascular: Normal rate, regular rhythm and normal heart sounds.   Pulmonary/Chest: Effort normal and breath sounds normal.  Abdominal: Soft. Bowel sounds are normal. There is no tenderness. There is no rebound.  Musculoskeletal: He exhibits no edema.  Lymphadenopathy:    He has no cervical adenopathy.  Psychiatric: He has a normal mood and affect.          Assessment & Plan:

## 2016-08-16 NOTE — Assessment & Plan Note (Signed)
Encouraged him to remain off cigarettes, he is coming up on 1 year anniversary.

## 2016-08-16 NOTE — Assessment & Plan Note (Signed)
Doing well Emphasize diet and exercise. He is working on losing wt.

## 2016-10-04 ENCOUNTER — Other Ambulatory Visit: Payer: Self-pay | Admitting: Infectious Diseases

## 2016-10-04 DIAGNOSIS — E785 Hyperlipidemia, unspecified: Secondary | ICD-10-CM

## 2016-10-04 DIAGNOSIS — I10 Essential (primary) hypertension: Secondary | ICD-10-CM

## 2016-10-24 ENCOUNTER — Ambulatory Visit: Payer: Self-pay | Admitting: Registered Nurse

## 2016-10-24 VITALS — BP 127/84 | HR 63 | Temp 97.7°F

## 2016-10-24 DIAGNOSIS — H6593 Unspecified nonsuppurative otitis media, bilateral: Secondary | ICD-10-CM

## 2016-10-24 DIAGNOSIS — J301 Allergic rhinitis due to pollen: Secondary | ICD-10-CM

## 2016-10-24 DIAGNOSIS — B20 Human immunodeficiency virus [HIV] disease: Secondary | ICD-10-CM

## 2016-10-24 MED ORDER — FLUTICASONE PROPIONATE 50 MCG/ACT NA SUSP: 1.0000 | Freq: Two times a day (BID) | NASAL | 0 refills | Status: DC

## 2016-10-24 MED ORDER — SALINE SPRAY 0.65 % NA SOLN: 2.0000 | NASAL | 0 refills | Status: DC

## 2016-10-24 NOTE — Progress Notes (Signed)
Subjective:    Patient ID: John Farrell, male    DOB: 12/09/1970, 46 y.o.   MRN: [id number]  45y/o caucasian male married established patient c/o chest congestion x3 days, productive cough starting today with green phlegm after starting mucinex. +Nasal drainage doesn't feel like allergies. Denies sore throat, ear pain/pressure, sinus pain, sick contacts, allergy flare.  Doesn't feel like sinus infection and/or heartburn either.  "feels wet in my chest  I haven't been sick since I stopped smoking a few months ago and my allergies improved a lot.  Mucous in the morning not as bad either but had recently worsened clears up by the time I am at work but wakes up with some in chest.       Review of Systems  Constitutional: Negative for activity change, appetite change, chills, diaphoresis, fatigue, fever and unexpected weight change.  HENT: Positive for rhinorrhea. Negative for congestion, dental problem, drooling, ear discharge, ear pain, facial swelling, hearing loss, mouth sores, nosebleeds, postnasal drip, sinus pain, sinus pressure, sneezing, sore throat, tinnitus, trouble swallowing and voice change.   Eyes: Negative for photophobia, pain, discharge, redness, itching and visual disturbance.  Respiratory: Positive for cough. Negative for choking, chest tightness, shortness of breath, wheezing and stridor.   Cardiovascular: Negative for chest pain, palpitations and leg swelling.  Gastrointestinal: Negative for abdominal distention, abdominal pain, blood in stool, constipation, diarrhea, nausea and vomiting.  Endocrine: Negative for cold intolerance and heat intolerance.  Genitourinary: Negative for dysuria.  Musculoskeletal: Negative for arthralgias, back pain, gait problem, joint swelling, myalgias, neck pain and neck stiffness.  Skin: Negative for color change, pallor, rash and wound.  Allergic/Immunologic: Positive for environmental allergies and immunocompromised state. Negative for  food allergies.  Neurological: Negative for dizziness, tremors, seizures, syncope, facial asymmetry, speech difficulty, weakness, light-headedness, numbness and headaches.  Hematological: Negative for adenopathy. Does not bruise/bleed easily.  Psychiatric/Behavioral: Negative for agitation, behavioral problems, confusion and sleep disturbance.       Objective:   Physical Exam  Constitutional: He is oriented to person, place, and time. Vital signs are normal. He appears well-developed and well-nourished. He is active and cooperative.  Non-toxic appearance. He does not have a sickly appearance. He does not appear ill. No distress.  HENT:  Head: Normocephalic and atraumatic.  Right Ear: Hearing, external ear and ear canal normal. A middle ear effusion is present.  Left Ear: Hearing, external ear and ear canal normal. A middle ear effusion is present.  Nose: Mucosal edema and rhinorrhea present. No nose lacerations, sinus tenderness, nasal deformity, septal deviation or nasal septal hematoma. No epistaxis.  No foreign bodies. Right sinus exhibits no maxillary sinus tenderness and no frontal sinus tenderness. Left sinus exhibits no maxillary sinus tenderness and no frontal sinus tenderness.  Mouth/Throat: Uvula is midline and mucous membranes are normal. Mucous membranes are not pale, not dry and not cyanotic. He does not have dentures. No oral lesions. No trismus in the jaw. Normal dentition. No dental abscesses, uvula swelling, lacerations or dental caries. Posterior oropharyngeal edema and posterior oropharyngeal erythema present. No oropharyngeal exudate or tonsillar abscesses.  Cobblestoning posterior pharynx; bilateral TMs air fluid level clear; bilateral allergic shiners; nasal turbinates edema erythema clear discharge  Eyes: Pupils are equal, round, and reactive to light. Conjunctivae, EOM and lids are normal. Right eye exhibits no chemosis, no discharge, no exudate and no hordeolum. No foreign  body present in the right eye. Left eye exhibits no chemosis, no discharge, no exudate and no  hordeolum. No foreign body present in the left eye. Right conjunctiva is not injected. Right conjunctiva has no hemorrhage. Left conjunctiva is not injected. Left conjunctiva has no hemorrhage. No scleral icterus. Right eye exhibits normal extraocular motion and no nystagmus. Left eye exhibits normal extraocular motion and no nystagmus. Right pupil is round and reactive. Left pupil is round and reactive. Pupils are equal.  Neck: Trachea normal, normal range of motion and phonation normal. Neck supple. No tracheal tenderness, no spinous process tenderness and no muscular tenderness present. No neck rigidity. No tracheal deviation, no edema, no erythema and normal range of motion present. No thyroid mass and no thyromegaly present.  Cardiovascular: Normal rate, regular rhythm, S1 normal, S2 normal, normal heart sounds and intact distal pulses.  PMI is not displaced.  Exam reveals no gallop and no friction rub.   No murmur heard. Pulmonary/Chest: Effort normal and breath sounds normal. No stridor. No respiratory distress. He has no decreased breath sounds. He has no wheezes. He has no rhonchi. He has no rales.  Abdominal: Soft. Normal appearance. He exhibits no distension, no fluid wave and no ascites. There is no rigidity and no guarding.  Musculoskeletal: Normal range of motion. He exhibits no edema or tenderness.       Right shoulder: Normal.       Left shoulder: Normal.       Right elbow: Normal.      Left elbow: Normal.       Right hip: Normal.       Left hip: Normal.       Right knee: Normal.       Left knee: Normal.       Cervical back: Normal.       Thoracic back: Normal.       Lumbar back: Normal.       Right hand: Normal.       Left hand: Normal.  Lymphadenopathy:       Head (right side): No submental, no submandibular, no tonsillar, no preauricular, no posterior auricular and no occipital  adenopathy present.       Head (left side): No submental, no submandibular, no tonsillar, no preauricular, no posterior auricular and no occipital adenopathy present.    He has no cervical adenopathy.       Right cervical: No superficial cervical, no deep cervical and no posterior cervical adenopathy present.      Left cervical: No superficial cervical, no deep cervical and no posterior cervical adenopathy present.  Neurological: He is alert and oriented to person, place, and time. He has normal strength. He displays no atrophy and no tremor. No cranial nerve deficit or sensory deficit. He exhibits normal muscle tone. He displays no seizure activity. Coordination and gait normal. GCS eye subscore is 4. GCS verbal subscore is 5. GCS motor subscore is 6.  Bilateral hand grasp equal bilaterally 5/5; on/off exam table/in/out of chair without difficulty; gait sure and steady in clinic  Skin: Skin is warm, dry and intact. No abrasion, no bruising, no burn, no ecchymosis, no laceration, no lesion, no petechiae and no rash noted. He is not diaphoretic. No cyanosis or erythema. No pallor. Nails show no clubbing.  Psychiatric: He has a normal mood and affect. His speech is normal and behavior is normal. Judgment and thought content normal. Cognition and memory are normal.  Nursing note and vitals reviewed.         Assessment & Plan:  A-seasonal allergic rhinitis, bilateral otitis media effusion  P-restart flonase 1 spray each nostril BiD #1 Rf0 at home and given 1 bottle nasal saline 2 sprays each nostril q2h WA.  Cough lozenges po q2h prn cough.  Shower prior to bedtime.  If no improvement with 48 hours of flonase and saline will start augmentin 875mg  po BID x 10 days #20 Rf0 as [place]   Patient may use normal saline nasal spray as needed.  Consider antihistamine or nasal steroid use.  Avoid triggers if possible.  Shower prior to bedtime if exposed to triggers.  If allergic  dust/dust mites recommend mattress/pillow covers/encasements; washing linens, vacuuming, sweeping, dusting weekly.  Call or return to clinic as needed if these symptoms worsen or fail to improve as anticipated.   Exitcare handout on allergic rhinitis and sinus rinse given to patient.  Patient verbalized understanding of instructions, agreed with plan of care and had no further questions at this time.  P2:  Avoidance and hand washing.  Supportive treatment.   No evidence of invasive bacterial infection, non toxic and well hydrated.  This is most likely self limiting viral infection.  I do not see where any further testing or imaging is necessary at this time.   I will suggest supportive care, rest, good hygiene and encourage the patient to take adequate fluids.  The patient is to return to clinic or EMERGENCY ROOM if symptoms worsen or change significantly e.g. ear pain, fever, purulent discharge from ears or bleeding.  Exitcare handout on otitis media with effusion given to patient.  Patient verbalized agreement and understanding of treatment plan.    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  Holding prednisone.  +PND start nasal saline and antihistamine of choice.  Bronchitis simple, community acquired, may have started as viral (probably respiratory syncytial, parainfluenza, influenza, or adenovirus), but now evidence of acute purulent bronchitis with resultant bronchial edema and mucus formation.  Viruses are the most common cause of bronchial inflammation in otherwise healthy adults with acute bronchitis.  The appearance of sputum is not predictive of whether a bacterial infection is present.  Purulent sputum is most often caused by viral infections.  There are a small portion of those caused by non-viral agents being Mycoplamsa pneumonia.  Microscopic examination or C&S of sputum in the healthy adult with acute bronchitis is generally not helpful (usually negative or normal respiratory flora) other considerations  being cough from upper respiratory tract infections, sinusitis or allergic syndromes (mild asthma or viral pneumonia).  Differential Diagnosis:  reactive airway disease (asthma, allergic aspergillosis (eosinophilia), chronic bronchitis, respiratory infection (Sinusitis, Common cold, pneumonia), congestive heart failure, reflux esophagitis, bronchogenic tumor, aspiration syndromes and/or exposure irritants/tobacco smoke.  In this case, there is no evidence of any invasive bacterial illness.  Most likely viral etiology so will hold on antibiotic treatment.  Advise supportive care with rest, encourage fluids, good hygiene and watch for any worsening symptoms.  If they were to develop:  come back to the office or go to the emergency room if after hours. Without high fever, severe dyspnea, lack of physical findings or other risk factors, I will hold on a chest radiograph and CBC at this time. I discussed that approximately 50% of patients with acute bronchitis have a cough that lasts up to three weeks, and 25% for over a month.  Tylenol, one to two tablets every four hours as needed for fever or myalgias.   No aspirin.  Patient instructed to follow up in 48 hours or sooner if symptoms  worsen. Patient verbalized agreement and understanding of treatment plan.  P2:  hand washing and cover cough

## 2016-10-25 NOTE — Patient Instructions (Signed)
Acute Bronchitis, Adult Acute bronchitis is sudden (acute) swelling of the air tubes (bronchi) in the lungs. Acute bronchitis causes these tubes to fill with mucus, which can make it hard to breathe. It can also cause coughing or wheezing. In adults, acute bronchitis usually goes away within 2 weeks. A cough caused by bronchitis may last up to 3 weeks. Smoking, allergies, and asthma can make the condition worse. Repeated episodes of bronchitis may cause further lung problems, such as chronic obstructive pulmonary disease (COPD). What are the causes? This condition can be caused by germs and by substances that irritate the lungs, including:  Cold and flu viruses. This condition is most often caused by the same virus that causes a cold.  Bacteria.  Exposure to tobacco smoke, dust, fumes, and air pollution.  What increases the risk? This condition is more likely to develop in people who:  Have close contact with someone with acute bronchitis.  Are exposed to lung irritants, such as tobacco smoke, dust, fumes, and vapors.  Have a weak immune system.  Have a respiratory condition such as asthma.  What are the signs or symptoms? Symptoms of this condition include:  A cough.  Coughing up clear, yellow, or green mucus.  Wheezing.  Chest congestion.  Shortness of breath.  A fever.  Body aches.  Chills.  A sore throat.  How is this diagnosed? This condition is usually diagnosed with a physical exam. During the exam, your health care provider may order tests, such as chest X-rays, to rule out other conditions. He or she may also:  Test a sample of your mucus for bacterial infection.  Check the level of oxygen in your blood. This is done to check for pneumonia.  Do a chest X-ray or lung function testing to rule out pneumonia and other conditions.  Perform blood tests.  Your health care provider will also ask about your symptoms and medical history. How is this  treated? Most cases of acute bronchitis clear up over time without treatment. Your health care provider may recommend:  Drinking more fluids. Drinking more makes your mucus thinner, which may make it easier to breathe.  Taking a medicine for a fever or cough.  Taking an antibiotic medicine.  Using an inhaler to help improve shortness of breath and to control a cough.  Using a cool mist vaporizer or humidifier to make it easier to breathe.  Follow these instructions at home: Medicines  Take over-the-counter and prescription medicines only as told by your health care provider.  If you were prescribed an antibiotic, take it as told by your health care provider. Do not stop taking the antibiotic even if you start to feel better. General instructions  Get plenty of rest.  Drink enough fluids to keep your urine clear or pale yellow.  Avoid smoking and secondhand smoke. Exposure to cigarette smoke or irritating chemicals will make bronchitis worse. If you smoke and you need help quitting, ask your health care provider. Quitting smoking will help your lungs heal faster.  Use an inhaler, cool mist vaporizer, or humidifier as told by your health care provider.  Keep all follow-up visits as told by your health care provider. This is important. How is this prevented? To lower your risk of getting this condition again:  Wash your hands often with soap and water. If soap and water are not available, use hand sanitizer.  Avoid contact with people who have cold symptoms.  Try not to touch your hands to your   mouth, nose, or eyes.  Make sure to get the flu shot every year.  Contact a health care provider if:  Your symptoms do not improve in 2 weeks of treatment. Get help right away if:  You cough up blood.  You have chest pain.  You have severe shortness of breath.  You become dehydrated.  You faint or keep feeling like you are going to faint.  You keep vomiting.  You have a  severe headache.  Your fever or chills gets worse. This information is not intended to replace advice given to you by your health care provider. Make sure you discuss any questions you have with your health care provider. Document Released: 04/20/2004 Document Revised: 10/06/2015 Document Reviewed: 09/01/2015 Elsevier Interactive Patient Education  2017 [employer] Sinus Rinse What is a sinus rinse? A sinus rinse is a simple home treatment that is used to rinse your sinuses with a sterile mixture of salt and water (saline solution). Sinuses are air-filled spaces in your skull behind the bones of your face and forehead that open into your nasal cavity. You will use the following:  Saline solution.  Neti pot or spray bottle. This releases the saline solution into your nose and through your sinuses. Neti pots and spray bottles can be purchased at [facility] local [facility], a health food store, or online.  When would I do a sinus rinse? A sinus rinse can help to clear mucus, dirt, dust, or pollen from the nasal cavity. You may do a sinus rinse when you have a cold, a virus, nasal allergy symptoms, a sinus infection, or stuffiness in the nose or sinuses. If you are considering a sinus rinse:  Ask your child's health care provider before performing a sinus rinse on your child.  Do not do a sinus rinse if you have had ear or nasal surgery, ear infection, or blocked ears.  How do I do a sinus rinse?  Wash your hands.  Disinfect your device according to the directions provided and then dry it.  Use the solution that comes with your device or one that is sold separately in stores. Follow the mixing directions on the package.  Fill your device with the amount of saline solution as directed by the device instructions.  Stand over a sink and tilt your head sideways over the sink.  Place the spout of the device in your upper nostril (the one closer to the ceiling).  Gently pour or squeeze the  saline solution into the nasal cavity. The liquid should drain to the lower nostril if you are not overly congested.  Gently blow your nose. Blowing too hard may cause ear pain.  Repeat in the other nostril.  Clean and rinse your device with clean water and then air-dry it. Are there risks of a sinus rinse? Sinus rinse is generally very safe and effective. However, there are a few risks, which include:  A burning sensation in the sinuses. This may happen if you do not make the saline solution as directed. Make sure to follow all directions when making the saline solution.  Infection from contaminated water. This is rare, but possible.  Nasal irritation.  This information is not intended to replace advice given to you by your health care provider. Make sure you discuss any questions you have with your health care provider. Document Released: 10/08/2013 Document Revised: 02/08/2016 Document Reviewed: 07/29/2013 [employer] Interactive Patient Education  2017 [employer]

## 2016-10-26 ENCOUNTER — Ambulatory Visit: Payer: Self-pay | Admitting: Registered Nurse

## 2016-10-26 VITALS — BP 144/97 | HR 69 | Temp 97.6°F

## 2016-10-26 DIAGNOSIS — J0101 Acute recurrent maxillary sinusitis: Secondary | ICD-10-CM

## 2016-10-26 NOTE — Progress Notes (Signed)
Subjective:    Patient ID: John Farrell, male    DOB: 1970-06-30, 46 y.o.   MRN: [id number]  45y/o caucasian male married established patient worsening sinus pressure/rhinitis.  Chest congestion improved but now feeling hot taking advil.  + sore throat and ear discomfort now also  Alternating chills and feeling hot      Review of Systems  Constitutional: Positive for chills, diaphoresis and fever. Negative for activity change, appetite change, fatigue and unexpected weight change.  HENT: Positive for congestion, postnasal drip, rhinorrhea, sinus pain, sinus pressure and sore throat. Negative for dental problem, drooling, ear discharge, ear pain, facial swelling, hearing loss, mouth sores, nosebleeds, sneezing, tinnitus, trouble swallowing and voice change.   Eyes: Negative for photophobia, pain, discharge, redness, itching and visual disturbance.  Respiratory: Positive for cough. Negative for choking, chest tightness, shortness of breath, wheezing and stridor.   Cardiovascular: Negative for chest pain, palpitations and leg swelling.  Gastrointestinal: Negative for abdominal distention, abdominal pain, blood in stool, constipation, diarrhea, nausea and vomiting.  Endocrine: Negative for cold intolerance and heat intolerance.  Genitourinary: Negative for dysuria.  Musculoskeletal: Negative for arthralgias, back pain, gait problem, joint swelling, myalgias, neck pain and neck stiffness.  Skin: Negative for color change, pallor, rash and wound.  Allergic/Immunologic: Positive for environmental allergies. Negative for food allergies and immunocompromised state.  Neurological: Negative for dizziness, tremors, seizures, syncope, facial asymmetry, speech difficulty, weakness, light-headedness, numbness and headaches.  Hematological: Negative for adenopathy. Does not bruise/bleed easily.  Psychiatric/Behavioral: Negative for agitation, behavioral problems, confusion and sleep disturbance.        Objective:   Physical Exam  Physical Exam  Constitutional: He is oriented to person, place, and time. Vital signs are normal. He appears well-developed and well-nourished. He is active and cooperative.  Non-toxic appearance. He does not have a sickly appearance. He does not appear ill. No distress.  HENT:  Head: Normocephalic and atraumatic.  Right Ear: Hearing, external ear and ear canal normal. A middle ear effusion is present.  Left Ear: Hearing, external ear and ear canal normal. A middle ear effusion is present.  Nose: Mucosal edema and rhinorrhea present. No nose lacerations, sinus tenderness, nasal deformity, septal deviation or nasal septal hematoma. No epistaxis.  No foreign bodies. Right sinus exhibits maxillary sinus tenderness and no frontal sinus tenderness. Left sinus exhibits maxillary sinus tenderness and no frontal sinus tenderness.  Mouth/Throat: Uvula is midline and mucous membranes are normal. Mucous membranes are not pale, not dry and not cyanotic. He does not have dentures. No oral lesions. No trismus in the jaw. Normal dentition. No dental abscesses, uvula swelling, lacerations or dental caries. Posterior oropharyngeal edema and posterior oropharyngeal erythema present. No oropharyngeal exudate or tonsillar abscesses.  Cobblestoning posterior pharynx with macular erythema oropharynx; bilateral TMs air fluid level right greater than left cloudy now 50%; bilateral allergic shiners; nasal turbinates edema erythema clear discharge  Eyes: Pupils are equal, round, and reactive to light. Conjunctivae, EOM and lids are normal. Right eye exhibits no chemosis, no discharge, no exudate and no hordeolum. No foreign body present in the right eye. Left eye exhibits no chemosis, no discharge, no exudate and no hordeolum. No foreign body present in the left eye. Right conjunctiva is not injected. Right conjunctiva has no hemorrhage. Left conjunctiva is not injected. Left conjunctiva has  no hemorrhage. No scleral icterus. Right eye exhibits normal extraocular motion and no nystagmus. Left eye exhibits normal extraocular motion and no nystagmus. Right pupil is  round and reactive. Left pupil is round and reactive. Pupils are equal.  Neck: Trachea normal, normal range of motion and phonation normal. Neck supple. No tracheal tenderness, no spinous process tenderness and no muscular tenderness present. No neck rigidity. No tracheal deviation, no edema, no erythema and normal range of motion present. No thyroid mass and no thyromegaly present.  Cardiovascular: Normal rate, regular rhythm, S1 normal, S2 normal, normal heart sounds and intact distal pulses.  PMI is not displaced.  Exam reveals no gallop and no friction rub.   No murmur heard. Pulmonary/Chest: Effort normal and breath sounds normal. No stridor. No respiratory distress. He has no decreased breath sounds. He has no wheezes. He has no rhonchi. He has no rales.  Abdominal: Soft. Normal appearance. He exhibits no distension, no fluid wave and no ascites. There is no rigidity and no guarding.  Musculoskeletal: Normal range of motion. He exhibits no edema or tenderness.       Right shoulder: Normal.       Left shoulder: Normal.       Right elbow: Normal.      Left elbow: Normal.       Right hip: Normal.       Left hip: Normal.       Right knee: Normal.       Left knee: Normal.       Cervical back: Normal.       Thoracic back: Normal.       Lumbar back: Normal.       Right hand: Normal.       Left hand: Normal.  Lymphadenopathy:       Head (right side): No submental, no submandibular, no tonsillar, no preauricular, no posterior auricular and no occipital adenopathy present.       Head (left side): No submental, no submandibular, no tonsillar, no preauricular, no posterior auricular and no occipital adenopathy present.    He has no cervical adenopathy.       Right cervical: No superficial cervical, no deep cervical and no  posterior cervical adenopathy present.      Left cervical: No superficial cervical, no deep cervical and no posterior cervical adenopathy present.  Neurological: He is alert and oriented to person, place, and time. He has normal strength. He displays no atrophy and no tremor. No cranial nerve deficit or sensory deficit. He exhibits normal muscle tone. He displays no seizure activity. Coordination and gait normal. GCS eye subscore is 4. GCS verbal subscore is 5. GCS motor subscore is 6.  Bilateral hand grasp equal bilaterally 5/5; on/off exam table/in/out of chair without difficulty; gait sure and steady in clinic  Skin: Skin is warm, dry and intact. No abrasion, no bruising, no burn, no ecchymosis, no laceration, no lesion, no petechiae and no rash noted. He is not diaphoretic. No cyanosis or erythema. No pallor. Nails show no clubbing.  Psychiatric: He has a normal mood and affect. His speech is normal and behavior is normal. Judgment and thought content normal. Cognition and memory are normal.  Nursing note and vitals reviewed.     Assessment & Plan:  A-maxillary sinusitis, seasonal allergic rhinitis, bilateral otitis media effusion  P-Augmentin 875mg  po BID x 10 days #20 RF0, given bottle of nasal saline from clinic stock  continue flonase 1 spray each nostril BiD #1 Rf0 at home and given 1 bottle nasal saline 2 sprays each nostril q2h WA.    Shower prior to bedtime.  If no improvement with 48  hours of flonase and saline will start augmentin 875mg  po BID x 10 days #20 Rf0 [place]   Patient may use normal saline nasal spray as needed.  Consider antihistamine or nasal steroid use.  Avoid triggers if possible.  Shower prior to bedtime if exposed to triggers.  If allergic dust/dust mites recommend mattress/pillow covers/encasements; washing linens, vacuuming, sweeping, dusting weekly.  Call or return to clinic as needed if these symptoms worsen or fail to improve as  anticipated.   Exitcare handout on sinusitis and sinus rinse given to patient.  Patient verbalized understanding of instructions, agreed with plan of care and had no further questions at this time.  P2:  Avoidance and hand washing.  Supportive treatment.   No evidence of invasive bacterial infection, non toxic and well hydrated.  This is most likely self limiting viral infection.  I do not see where any further testing or imaging is necessary at this time.   I will suggest supportive care, rest, good hygiene and encourage the patient to take adequate fluids.  The patient is to return to clinic or EMERGENCY ROOM if symptoms worsen or change significantly e.g. ear pain, fever, purulent discharge from ears or bleeding.  Exitcare handout on otitis media with effusion given to patient.  Patient verbalized agreement and understanding of treatment plan.

## 2017-02-22 ENCOUNTER — Ambulatory Visit: Payer: PRIVATE HEALTH INSURANCE | Admitting: Family Medicine

## 2017-02-22 ENCOUNTER — Encounter: Payer: Self-pay | Admitting: Family Medicine

## 2017-02-22 VITALS — BP 128/88 | HR 71 | Temp 98.1°F | Ht 71.0 in | Wt 174.2 lb

## 2017-02-22 DIAGNOSIS — E781 Pure hyperglyceridemia: Secondary | ICD-10-CM | POA: Insufficient documentation

## 2017-02-22 DIAGNOSIS — Z0000 Encounter for general adult medical examination without abnormal findings: Secondary | ICD-10-CM | POA: Diagnosis not present

## 2017-02-22 DIAGNOSIS — B20 Human immunodeficiency virus [HIV] disease: Secondary | ICD-10-CM | POA: Diagnosis not present

## 2017-02-22 DIAGNOSIS — E785 Hyperlipidemia, unspecified: Secondary | ICD-10-CM | POA: Diagnosis not present

## 2017-02-22 LAB — CBC
HCT: 46.3 % (ref 39.0–52.0)
HEMOGLOBIN: 15.6 g/dL (ref 13.0–17.0)
MCHC: 33.7 g/dL (ref 30.0–36.0)
MCV: 86 fl (ref 78.0–100.0)
PLATELETS: 276 10*3/uL (ref 150.0–400.0)
RBC: 5.38 Mil/uL (ref 4.22–5.81)
RDW: 15 % (ref 11.5–15.5)
WBC: 6.2 10*3/uL (ref 4.0–10.5)

## 2017-02-22 LAB — LIPID PANEL
CHOLESTEROL: 196 mg/dL (ref 0–200)
HDL: 34.1 mg/dL — AB (ref >39.00)
NonHDL: 161.93
TRIGLYCERIDES: 255 mg/dL — AB (ref 0.0–149.0)
Total CHOL/HDL Ratio: 6
VLDL: 51 mg/dL — ABNORMAL HIGH (ref 0.0–40.0)

## 2017-02-22 LAB — COMPREHENSIVE METABOLIC PANEL
ALBUMIN: 4.5 g/dL (ref 3.5–5.2)
ALK PHOS: 58 U/L (ref 39–117)
ALT: 35 U/L (ref 0–53)
AST: 37 U/L (ref 0–37)
BILIRUBIN TOTAL: 0.4 mg/dL (ref 0.2–1.2)
BUN: 15 mg/dL (ref 6–23)
CALCIUM: 10 mg/dL (ref 8.4–10.5)
CO2: 32 mEq/L (ref 19–32)
CREATININE: 0.94 mg/dL (ref 0.40–1.50)
Chloride: 101 mEq/L (ref 96–112)
GFR: 91.34 mL/min (ref >60.00)
Glucose, Bld: 115 mg/dL — ABNORMAL HIGH (ref 70–99)
Potassium: 4.1 mEq/L (ref 3.5–5.1)
Sodium: 137 mEq/L (ref 135–145)
TOTAL PROTEIN: 7.8 g/dL (ref 6.0–8.3)

## 2017-02-22 LAB — LDL CHOLESTEROL, DIRECT: Direct LDL: 117 mg/dL

## 2017-02-22 NOTE — Progress Notes (Signed)
Pre visit review using our clinic review tool, if applicable. No additional management support is needed unless otherwise documented below in the visit note. 

## 2017-02-22 NOTE — Patient Instructions (Signed)
It was very nice meeting you today.  Give us 2-3 business days to get the results of your labs back. If labs are normal, you will likely receive a letter in the mail unless you have MyChart. This can take longer than 2-3 business days.   Keep up the good work.

## 2017-02-22 NOTE — Progress Notes (Signed)
Chief Complaint  Patient presents with  . Establish Care    Well Male John Farrell is here for a complete physical.   His last physical was >1 year ago.  Current diet: in general, a "healthy" diet   Current exercise: [facility] 3x/week cardio and lifting Weight trend: stable Does pt snore? No. Daytime fatigue? No. Seat belt? Yes.    Health maintenance Tetanus- Yes HIV- pt is HIV positive Prostate cancer screening- No  Past Medical History:  Diagnosis Date  . Abdominal pain   . DEPRESSION, MILD   . HIV infection (HCC)   . Hyperlipidemia   . Hypertension   . HYPERTRIGLYCERIDEMIA   . Sinusitis   . SYPHILIS   . UNSPECIFIED NEURALGIA NEURITIS AND RADICULITIS     Past Surgical History:  Procedure Laterality Date  . CHOLECYSTECTOMY     Medications  Current Outpatient Medications on File Prior to Visit  Medication Sig Dispense Refill  . efavirenz-emtricitabine-tenofovir (ATRIPLA) 600-200-300 MG tablet TAKE 1 TABLET AT BEDTIME 90 tablet 3  . fenofibrate 54 MG tablet TAKE 1 TABLET BY MOUTH DAILY 90 tablet 1  . lisinopril (PRINIVIL,ZESTRIL) 5 MG tablet TAKE 1 TABLET BY MOUTH DAILY 90 tablet 1  . omeprazole (PRILOSEC) 40 MG capsule Take 1 capsule (40 mg total) by mouth daily. 90 capsule 12  . fluticasone (FLONASE) 50 MCG/ACT nasal spray Place 1 spray into both nostrils 2 (two) times daily. 16 g 0   Allergies No Known Allergies   Family History Family History  Problem Relation Age of Onset  . Hypertension Mother   . Colon polyps Mother   . Hypertension Father   . Heart disease Father   . Heart attack Father   . Hyperlipidemia Sister   . Hypertension Sister   . Diabetes Brother   . Hyperlipidemia Brother   . Hearing loss Maternal Grandmother   . Heart disease Maternal Grandmother   . Hyperlipidemia Maternal Grandmother   . Hypertension Maternal Grandmother   . Cancer Maternal Grandfather        lung cancer  . Heart attack Paternal Grandmother   .  Heart attack Paternal Grandfather    Social History   Socioeconomic History  . Marital status: Married  Tobacco Use  . Smoking status: Former Smoker    Packs/day: 0.50    Types: Cigarettes    Last attempt to quit: 09/30/2015    Years since quitting: 1.4  . Smokeless tobacco: Never Used  . Tobacco comment: congratulated!!!  Substance and Sexual Activity  . Alcohol use: No    Alcohol/week: 0.0 oz    Comment: rarely  . Drug use: No  . Sexual activity: Yes    Partners: Male    Comment: pt. declined condoms    Review of Systems: Constitutional: no fevers or chills Eye:  no recent significant change in vision Ear/Nose/Mouth/Throat:  Ears:  no tinnitus or hearing loss Nose/Mouth/Throat:  no complaints of nasal congestion or bleeding, no sore throat Cardiovascular:  no chest pain, no palpitations Respiratory:  no cough and no shortness of breath Gastrointestinal:  no abdominal pain, no change in bowel habits, no nausea, vomiting, diarrhea, or constipation and no black or bloody stool GU:  Male: negative for dysuria, frequency, and incontinence and negative for prostate symptoms Musculoskeletal/Extremities:  no pain, redness, or swelling of the joints Integumentary (Skin/Breast):  no abnormal skin lesions reported Neurologic:  no headaches, no numbness, tingling Endocrine: No unexpected weight changes Hematologic/Lymphatic:  no night sweats  Exam  BP 128/88 (BP Location: Left Arm, Patient Position: Sitting, Cuff Size: Normal)   Pulse 71   Temp 98.1 F (36.7 C) (Oral)   Ht 5\' 11"  (1.803 m)   Wt 174 lb 4 oz (79 kg)   SpO2 98%   BMI 24.30 kg/m  General:  well developed, well nourished, in no apparent distress Skin:  no significant moles, warts, or growths Head:  no masses, lesions, or tenderness Eyes:  pupils equal and round, sclera anicteric without injection Ears:  canals without lesions, TMs shiny without retraction, no obvious effusion, no erythema Nose:  nares patent,  septum midline, mucosa normal Throat/Pharynx:  lips and gingiva without lesion; tongue and uvula midline; non-inflamed pharynx; no exudates or postnasal drainage Neck: neck supple without adenopathy, thyromegaly, or masses Lungs:  clear to auscultation, breath sounds equal bilaterally, no respiratory distress Cardio:  regular rate and rhythm without murmurs, heart sounds without clicks or rubs Abdomen:  abdomen soft, nontender; bowel sounds normal; no masses or organomegaly Genital (male): Nml penis, no lesions or discharge; testes present bilaterally without masses or tenderness Rectal: Deferred Musculoskeletal:  symmetrical muscle groups noted without atrophy or deformity Extremities:  no clubbing, cyanosis, or edema, no deformities, no skin discoloration Neuro:  gait normal; deep tendon reflexes normal and symmetric Psych: well oriented with normal range of affect and appropriate judgment/insight  Assessment and Plan  Well adult exam - Plan: CBC, Comprehensive metabolic panel, Lipid panel   Well 46 y.o. male. May need to change fibrate dosage. Counseled on risks and benefits of prostate CA screening, pt opted to hold off on testing for now.  Counseled on diet and exercise. Other orders as above. Follow up in 6 mo for med check pending the above workup. The patient voiced understanding and agreement to the plan.  Jilda Rocheicholas Paul [place], DO 02/22/17 12:05 PM

## 2017-02-23 ENCOUNTER — Other Ambulatory Visit: Payer: Self-pay | Admitting: Family Medicine

## 2017-02-23 MED ORDER — FENOFIBRATE 145 MG PO TABS: 145.0000 mg | ORAL_TABLET | Freq: Every day | ORAL | 1 refills | Status: DC

## 2017-02-23 NOTE — Progress Notes (Signed)
Higher med dose called in, pt notified.

## 2017-02-26 ENCOUNTER — Other Ambulatory Visit (INDEPENDENT_AMBULATORY_CARE_PROVIDER_SITE_OTHER): Payer: PRIVATE HEALTH INSURANCE

## 2017-02-26 DIAGNOSIS — R7309 Other abnormal glucose: Secondary | ICD-10-CM | POA: Diagnosis not present

## 2017-02-26 LAB — HEMOGLOBIN A1C: HEMOGLOBIN A1C: 5.8 % (ref 4.6–6.5)

## 2017-03-19 ENCOUNTER — Other Ambulatory Visit: Payer: Self-pay | Admitting: Infectious Diseases

## 2017-03-19 DIAGNOSIS — I10 Essential (primary) hypertension: Secondary | ICD-10-CM

## 2017-04-05 ENCOUNTER — Ambulatory Visit: Payer: Self-pay | Admitting: Registered Nurse

## 2017-04-05 VITALS — BP 145/88 | HR 71 | Temp 97.1°F | Resp 16

## 2017-04-05 DIAGNOSIS — J0101 Acute recurrent maxillary sinusitis: Secondary | ICD-10-CM

## 2017-04-05 MED ORDER — BUDESONIDE 32 MCG/ACT NA SUSP: 1.0000 | Freq: Every day | NASAL | 6 refills | Status: DC

## 2017-04-05 MED ORDER — AMOXICILLIN-POT CLAVULANATE 875-125 MG PO TABS: 1.0000 | ORAL_TABLET | Freq: Two times a day (BID) | ORAL | 0 refills | Status: AC

## 2017-04-05 NOTE — Progress Notes (Signed)
Subjective:    Patient ID: John Farrell, male    DOB: [birth year], 47 y.o.   MRN: [id number]  46y/o caucasian male established Pt c/o "milky green sinus discharge with blood streaked through it" x7 days. Has been using saline spray and just started back on Flonase 2 days ago.   Patient reported that in the past 6 months every time he starts using flonase he gets bloody tinged mucous discharge.  Flonase does decrease his amount of mucous and congestion and he would like to continue using it as he finds it helpful.  + sick coworkers  Currently not taking antihistamine had stopped in the fall      Review of Systems  Constitutional: Negative for activity change, appetite change, chills, diaphoresis, fatigue, fever and unexpected weight change.  HENT: Positive for congestion, postnasal drip, rhinorrhea, sinus pressure, sinus pain and sore throat. Negative for dental problem, drooling, ear discharge, ear pain, facial swelling, hearing loss, mouth sores, nosebleeds, sneezing, tinnitus, trouble swallowing and voice change.   Eyes: Negative for photophobia, pain, discharge, redness, itching and visual disturbance.  Respiratory: Positive for cough. Negative for choking, chest tightness, shortness of breath, wheezing and stridor.   Cardiovascular: Negative for chest pain, palpitations and leg swelling.  Gastrointestinal: Negative for abdominal distention, abdominal pain, blood in stool, constipation, diarrhea, nausea and vomiting.  Endocrine: Negative for cold intolerance and heat intolerance.  Genitourinary: Negative for dysuria.  Musculoskeletal: Negative for arthralgias, back pain, gait problem, joint swelling, myalgias, neck pain and neck stiffness.  Skin: Negative for color change, pallor, rash and wound.  Allergic/Immunologic: Positive for environmental allergies. Negative for food allergies and immunocompromised state.  Neurological: Negative for dizziness, tremors, seizures, syncope, facial  asymmetry, speech difficulty, weakness, light-headedness, numbness and headaches.  Hematological: Negative for adenopathy. Does not bruise/bleed easily.  Psychiatric/Behavioral: Negative for agitation, behavioral problems, confusion and sleep disturbance.       Objective:   Physical Exam  Constitutional: He is oriented to person, place, and time. Vital signs are normal. He appears well-developed and well-nourished. He is active and cooperative.  Non-toxic appearance. He does not have a sickly appearance. He does not appear ill. No distress.  HENT:  Head: Normocephalic and atraumatic.  Right Ear: Hearing, external ear and ear canal normal. A middle ear effusion is present.  Left Ear: Hearing, external ear and ear canal normal. A middle ear effusion is present.  Nose: Mucosal edema and rhinorrhea present. No nose lacerations, sinus tenderness, nasal deformity, septal deviation or nasal septal hematoma. No epistaxis.  No foreign bodies. Right sinus exhibits maxillary sinus tenderness. Right sinus exhibits no frontal sinus tenderness. Left sinus exhibits maxillary sinus tenderness. Left sinus exhibits no frontal sinus tenderness.  Mouth/Throat: Uvula is midline and mucous membranes are normal. Mucous membranes are not pale, not dry and not cyanotic. He does not have dentures. No oral lesions. No trismus in the jaw. Normal dentition. No dental abscesses, uvula swelling, lacerations or dental caries. Posterior oropharyngeal edema and posterior oropharyngeal erythema present. No oropharyngeal exudate or tonsillar abscesses.  Cobblestoning posterior pharynx; bilateral TMs air fluid level clear; bilateral allergic shiners; bilateral nasal turbinates edema/erythema/clear yellow discharge  Eyes: Conjunctivae, EOM and lids are normal. Pupils are equal, round, and reactive to light. Right eye exhibits no chemosis, no discharge, no exudate and no hordeolum. No foreign body present in the right eye. Left eye  exhibits no chemosis, no discharge, no exudate and no hordeolum. No foreign body present in the left eye.  Right conjunctiva is not injected. Right conjunctiva has no hemorrhage. Left conjunctiva is not injected. Left conjunctiva has no hemorrhage. No scleral icterus. Right eye exhibits normal extraocular motion and no nystagmus. Left eye exhibits normal extraocular motion and no nystagmus. Right pupil is round and reactive. Left pupil is round and reactive. Pupils are equal.  Neck: Trachea normal, normal range of motion and phonation normal. Neck supple. No tracheal tenderness and no muscular tenderness present. No neck rigidity. No tracheal deviation, no edema, no erythema and normal range of motion present. No thyroid mass and no thyromegaly present.  Cardiovascular: Normal rate, regular rhythm, S1 normal, S2 normal, normal heart sounds and intact distal pulses. PMI is not displaced. Exam reveals no gallop and no friction rub.  No murmur heard. Pulmonary/Chest: Effort normal and breath sounds normal. No accessory muscle usage or stridor. No respiratory distress. He has no decreased breath sounds. He has no wheezes. He has no rhonchi. He has no rales.  Cough not observed in exam room; spoke full sentences without difficulty  Abdominal: Soft. Normal appearance. He exhibits no distension, no fluid wave and no ascites. There is no rigidity and no guarding.  Musculoskeletal: Normal range of motion. He exhibits no edema or tenderness.       Right shoulder: Normal.       Left shoulder: Normal.       Right elbow: Normal.      Left elbow: Normal.       Right hip: Normal.       Left hip: Normal.       Right knee: Normal.       Left knee: Normal.       Cervical back: Normal.       Thoracic back: Normal.       Lumbar back: Normal.       Right hand: Normal.       Left hand: Normal.  Lymphadenopathy:       Head (right side): No submental, no submandibular, no tonsillar, no preauricular, no posterior  auricular and no occipital adenopathy present.       Head (left side): No submental, no submandibular, no tonsillar, no preauricular, no posterior auricular and no occipital adenopathy present.    He has no cervical adenopathy.       Right cervical: No superficial cervical, no deep cervical and no posterior cervical adenopathy present.      Left cervical: No superficial cervical, no deep cervical and no posterior cervical adenopathy present.  Neurological: He is alert and oriented to person, place, and time. He has normal strength. He displays no atrophy and no tremor. No cranial nerve deficit or sensory deficit. He exhibits normal muscle tone. He displays no seizure activity. Coordination and gait normal. GCS eye subscore is 4. GCS verbal subscore is 5. GCS motor subscore is 6.  On/off exam table; in/out of chair without difficulty; gait sure and steady in hallway  Skin: Skin is warm, dry and intact. No abrasion, no bruising, no burn, no ecchymosis, no laceration, no lesion, no petechiae and no rash noted. He is not diaphoretic. No cyanosis or erythema. No pallor. Nails show no clubbing.  Psychiatric: He has a normal mood and affect. His speech is normal and behavior is normal. Judgment and thought content normal. Cognition and memory are normal.  Nursing note and vitals reviewed.         Assessment & Plan:  A-acute recurrent maxillary sinusitis  P-Stop flonase start budesonide 32mg  1 spray  each nostril daily #1 RF6 electronic Rx given to pharmacy of choice, saline 2 sprays each nostril q2h wa prn congestion 1 given to patient from clinic stock.  Discussed with patient sometimes a change in nasal steroids will halt bloody discharge but it could just be related to when he has sinusitis also.  If no improvement with 48 hours of saline and budesonide use start augmentin 875mg  po BID x 10 days #20 RF0 dispensed from [facility].  Denied personal or family history of ENT cancer.  Shower BID especially prior  to bed. No evidence of systemic bacterial infection, non toxic and well hydrated.  I do not see where any further testing or imaging is necessary at this time.  I will suggest supportive care, rest, good hygiene and encourage the patient to take adequate fluids.  The patient is to return to clinic or EMERGENCY ROOM if symptoms worsen or change significantly.  Exitcare handout on sinusitis and sinus rinse given to patient.  Patient verbalized agreement and understanding of treatment plan and had no further questions at this time.   P2:  Hand washing and cover cough

## 2017-04-05 NOTE — Patient Instructions (Signed)
Sinus Rinse What is a sinus rinse? A sinus rinse is a simple home treatment that is used to rinse your sinuses with a sterile mixture of salt and water (saline solution). Sinuses are air-filled spaces in your skull behind the bones of your face and forehead that open into your nasal cavity. You will use the following:  Saline solution.  Neti pot or spray bottle. This releases the saline solution into your nose and through your sinuses. Neti pots and spray bottles can be purchased at your local pharmacy, a health food store, or online.  When would I do a sinus rinse? A sinus rinse can help to clear mucus, dirt, dust, or pollen from the nasal cavity. You may do a sinus rinse when you have a cold, a virus, nasal allergy symptoms, a sinus infection, or stuffiness in the nose or sinuses. If you are considering a sinus rinse:  Ask your child's health care provider before performing a sinus rinse on your child.  Do not do a sinus rinse if you have had ear or nasal surgery, ear infection, or blocked ears.  How do I do a sinus rinse?  Wash your hands.  Disinfect your device according to the directions provided and then dry it.  Use the solution that comes with your device or one that is sold separately in stores. Follow the mixing directions on the package.  Fill your device with the amount of saline solution as directed by the device instructions.  Stand over a sink and tilt your head sideways over the sink.  Place the spout of the device in your upper nostril (the one closer to the ceiling).  Gently pour or squeeze the saline solution into the nasal cavity. The liquid should drain to the lower nostril if you are not overly congested.  Gently blow your nose. Blowing too hard may cause ear pain.  Repeat in the other nostril.  Clean and rinse your device with clean water and then air-dry it. Are there risks of a sinus rinse? Sinus rinse is generally very safe and effective. However,  there are a few risks, which include:  A burning sensation in the sinuses. This may happen if you do not make the saline solution as directed. Make sure to follow all directions when making the saline solution.  Infection from contaminated water. This is rare, but possible.  Nasal irritation.  This information is not intended to replace advice given to you by your health care provider. Make sure you discuss any questions you have with your health care provider. Document Released: 10/08/2013 Document Revised: 02/08/2016 Document Reviewed: 07/29/2013 Elsevier Interactive Patient Education  2017 [employer] Sinusitis, Adult Sinusitis is soreness and inflammation of your sinuses. Sinuses are hollow spaces in the bones around your face. Your sinuses are located:  Around your eyes.  In the middle of your forehead.  Behind your nose.  In your cheekbones.  Your sinuses and nasal passages are lined with a stringy fluid (mucus). Mucus normally drains out of your sinuses. When your nasal tissues become inflamed or swollen, the mucus can become trapped or blocked so air cannot flow through your sinuses. This allows bacteria, viruses, and funguses to grow, which leads to infection. Sinusitis can develop quickly and last for 7?10 days (acute) or for more than 12 weeks (chronic). Sinusitis often develops after a cold. What are the causes? This condition is caused by anything that creates swelling in the sinuses or stops mucus from draining, including:  Allergies.    Asthma.  Bacterial or viral infection.  Abnormally shaped bones between the nasal passages.  Nasal growths that contain mucus (nasal polyps).  Narrow sinus openings.  Pollutants, such as chemicals or irritants in the air.  A foreign object stuck in the nose.  A fungal infection. This is rare.  What increases the risk? The following factors may make you more likely to develop this condition:  Having allergies or  asthma.  Having had a recent cold or respiratory tract infection.  Having structural deformities or blockages in your nose or sinuses.  Having a weak immune system.  Doing a lot of swimming or diving.  Overusing nasal sprays.  Smoking.  What are the signs or symptoms? The main symptoms of this condition are pain and a feeling of pressure around the affected sinuses. Other symptoms include:  Upper toothache.  Earache.  Headache.  Bad breath.  Decreased sense of smell and taste.  A cough that may get worse at night.  Fatigue.  Fever.  Thick drainage from your nose. The drainage is often green and it may contain pus (purulent).  Stuffy nose or congestion.  Postnasal drip. This is when extra mucus collects in the throat or back of the nose.  Swelling and warmth over the affected sinuses.  Sore throat.  Sensitivity to light.  How is this diagnosed? This condition is diagnosed based on symptoms, a medical history, and a physical exam. To find out if your condition is acute or chronic, your health care provider may:  Look in your nose for signs of nasal polyps.  Tap over the affected sinus to check for signs of infection.  View the inside of your sinuses using an imaging device that has a light attached (endoscope).  If your health care provider suspects that you have chronic sinusitis, you may also:  Be tested for allergies.  Have a sample of mucus taken from your nose (nasal culture) and checked for bacteria.  Have a mucus sample examined to see if your sinusitis is related to an allergy.  If your sinusitis does not respond to treatment and it lasts longer than 8 weeks, you may have an MRI or CT scan to check your sinuses. These scans also help to determine how severe your infection is. In rare cases, a bone biopsy may be done to rule out more serious types of fungal sinus disease. How is this treated? Treatment for sinusitis depends on the cause and  whether your condition is chronic or acute. If a virus is causing your sinusitis, your symptoms will go away on their own within 10 days. You may be given medicines to relieve your symptoms, including:  Topical nasal decongestants. They shrink swollen nasal passages and let mucus drain from your sinuses.  Antihistamines. These drugs block inflammation that is triggered by allergies. This can help to ease swelling in your nose and sinuses.  Topical nasal corticosteroids. These are nasal sprays that ease inflammation and swelling in your nose and sinuses.  Nasal saline washes. These rinses can help to get rid of thick mucus in your nose.  If your condition is caused by bacteria, you will be given an antibiotic medicine. If your condition is caused by a fungus, you will be given an antifungal medicine. Surgery may be needed to correct underlying conditions, such as narrow nasal passages. Surgery may also be needed to remove polyps. Follow these instructions at home: Medicines  Take, use, or apply over-the-counter and prescription medicines only as told by   your health care provider. These may include nasal sprays.  If you were prescribed an antibiotic medicine, take it as told by your health care provider. Do not stop taking the antibiotic even if you start to feel better. Hydrate and Humidify  Drink enough water to keep your urine clear or pale yellow. Staying hydrated will help to thin your mucus.  Use a cool mist humidifier to keep the humidity level in your home above 50%.  Inhale steam for 10-15 minutes, 3-4 times a day or as told by your health care provider. You can do this in the bathroom while a hot shower is running.  Limit your exposure to cool or dry air. Rest  Rest as much as possible.  Sleep with your head raised (elevated).  Make sure to get enough sleep each night. General instructions  Apply a warm, moist washcloth to your face 3-4 times a day or as told by your  health care provider. This will help with discomfort.  Wash your hands often with soap and water to reduce your exposure to viruses and other germs. If soap and water are not available, use hand sanitizer.  Do not smoke. Avoid being around people who are smoking (secondhand smoke).  Keep all follow-up visits as told by your health care provider. This is important. Contact a health care provider if:  You have a fever.  Your symptoms get worse.  Your symptoms do not improve within 10 days. Get help right away if:  You have a severe headache.  You have persistent vomiting.  You have pain or swelling around your face or eyes.  You have vision problems.  You develop confusion.  Your neck is stiff.  You have trouble breathing. This information is not intended to replace advice given to you by your health care provider. Make sure you discuss any questions you have with your health care provider. Document Released: 03/13/2005 Document Revised: 11/07/2015 Document Reviewed: 01/06/2015 Elsevier Interactive Patient Education  2018 [employer] Inc[employer]  

## 2017-05-06 ENCOUNTER — Other Ambulatory Visit: Payer: Self-pay | Admitting: Infectious Diseases

## 2017-05-06 DIAGNOSIS — B20 Human immunodeficiency virus [HIV] disease: Secondary | ICD-10-CM

## 2017-07-25 ENCOUNTER — Telehealth: Payer: Self-pay | Admitting: Behavioral Health

## 2017-07-25 ENCOUNTER — Other Ambulatory Visit (HOSPITAL_COMMUNITY)
Admission: RE | Admit: 2017-07-25 | Discharge: 2017-07-25 | Disposition: A | Discharge status: Home / Self Care | Payer: PRIVATE HEALTH INSURANCE | Source: Ambulatory Visit | Attending: Infectious Diseases | Admitting: Infectious Diseases

## 2017-07-25 ENCOUNTER — Other Ambulatory Visit: Payer: PRIVATE HEALTH INSURANCE

## 2017-07-25 DIAGNOSIS — Z113 Encounter for screening for infections with a predominantly sexual mode of transmission: Secondary | ICD-10-CM | POA: Insufficient documentation

## 2017-07-25 DIAGNOSIS — E781 Pure hyperglyceridemia: Secondary | ICD-10-CM

## 2017-07-25 DIAGNOSIS — B20 Human immunodeficiency virus [HIV] disease: Secondary | ICD-10-CM

## 2017-07-25 LAB — CBC
HCT: 45.1 % (ref 38.5–50.0)
Hemoglobin: 15.5 g/dL (ref 13.2–17.1)
MCH: 28.5 pg (ref 27.0–33.0)
MCHC: 34.4 g/dL (ref 32.0–36.0)
MCV: 82.9 fL (ref 80.0–100.0)
MPV: 9.1 fL (ref 7.5–12.5)
PLATELETS: 238 10*3/uL (ref 140–400)
RBC: 5.44 10*6/uL (ref 4.20–5.80)
RDW: 14.4 % (ref 11.0–15.0)
WBC: 5 10*3/uL (ref 3.8–10.8)

## 2017-07-25 LAB — COMPREHENSIVE METABOLIC PANEL
AG Ratio: 1.7 (calc) (ref 1.0–2.5)
ALBUMIN MSPROF: 4.3 g/dL (ref 3.6–5.1)
ALT: 37 U/L (ref 9–46)
AST: 41 U/L — ABNORMAL HIGH (ref 10–40)
Alkaline phosphatase (APISO): 49 U/L (ref 40–115)
BUN: 14 mg/dL (ref 7–25)
CALCIUM: 9.3 mg/dL (ref 8.6–10.3)
CO2: 26 mmol/L (ref 20–32)
CREATININE: 0.9 mg/dL (ref 0.60–1.35)
Chloride: 103 mmol/L (ref 98–110)
GLUCOSE: 99 mg/dL (ref 65–99)
Globulin: 2.6 g/dL (calc) (ref 1.9–3.7)
Potassium: 4.4 mmol/L (ref 3.5–5.3)
SODIUM: 136 mmol/L (ref 135–146)
TOTAL PROTEIN: 6.9 g/dL (ref 6.1–8.1)
Total Bilirubin: 0.5 mg/dL (ref 0.2–1.2)

## 2017-07-25 LAB — LIPID PANEL
CHOL/HDL RATIO: 4.3 (calc) (ref <5.0)
Cholesterol: 165 mg/dL (ref <200)
HDL: 38 mg/dL — ABNORMAL LOW (ref >40)
LDL CHOLESTEROL (CALC): 105 mg/dL — AB
Non-HDL Cholesterol (Calc): 127 mg/dL (calc) (ref <130)
Triglycerides: 128 mg/dL (ref <150)

## 2017-07-25 NOTE — Addendum Note (Signed)
Addended by: Lurlean Leyden on: 07/25/2017 09:13 AM   Modules accepted: Orders

## 2017-07-25 NOTE — Telephone Encounter (Signed)
Patient came in for a lab visit today complaining of stomach cramps and diarrhea since yesterday 07/24/2017.  Patient states he became nauseous 07/23/2017 and vomited initially but it is now diarrhea.  Diarrhea 3-4 times a day yesterday 07/24/2017.  Patient states he had been on a liquid diet.  Writer advised patient to start introducing solids back into his diet and increase fluid intake.  Patient states he had been feeling a little better but wanted Dr. Ninetta Lights to know about it.  Also advised him to go to urgent care if symptoms worsen.

## 2017-07-26 LAB — URINE CYTOLOGY ANCILLARY ONLY
Chlamydia: NEGATIVE
Neisseria Gonorrhea: NEGATIVE

## 2017-07-26 LAB — T-HELPER CELL (CD4) - (RCID CLINIC ONLY)
CD4 % Helper T Cell: 19 % — ABNORMAL LOW (ref 33–55)
CD4 T Cell Abs: 240 /uL — ABNORMAL LOW (ref 400–2700)

## 2017-07-26 LAB — RPR: RPR Ser Ql: NONREACTIVE

## 2017-07-27 LAB — HIV-1 RNA QUANT-NO REFLEX-BLD
HIV 1 RNA QUANT: NOT DETECTED {copies}/mL
HIV-1 RNA Quant, Log: <1.3 Log copies/mL

## 2017-08-08 ENCOUNTER — Other Ambulatory Visit: Payer: Self-pay | Admitting: Family Medicine

## 2017-08-14 ENCOUNTER — Ambulatory Visit: Payer: PRIVATE HEALTH INSURANCE | Admitting: Infectious Diseases

## 2017-08-14 ENCOUNTER — Encounter: Payer: Self-pay | Admitting: Infectious Diseases

## 2017-08-14 VITALS — BP 124/78 | HR 67 | Temp 98.4°F | Ht 71.0 in | Wt 175.0 lb

## 2017-08-14 DIAGNOSIS — B20 Human immunodeficiency virus [HIV] disease: Secondary | ICD-10-CM

## 2017-08-14 DIAGNOSIS — Z113 Encounter for screening for infections with a predominantly sexual mode of transmission: Secondary | ICD-10-CM

## 2017-08-14 DIAGNOSIS — N521 Erectile dysfunction due to diseases classified elsewhere: Secondary | ICD-10-CM | POA: Diagnosis not present

## 2017-08-14 DIAGNOSIS — F17200 Nicotine dependence, unspecified, uncomplicated: Secondary | ICD-10-CM | POA: Diagnosis not present

## 2017-08-14 DIAGNOSIS — Z23 Encounter for immunization: Secondary | ICD-10-CM | POA: Diagnosis not present

## 2017-08-14 DIAGNOSIS — I10 Essential (primary) hypertension: Secondary | ICD-10-CM | POA: Diagnosis not present

## 2017-08-14 DIAGNOSIS — E781 Pure hyperglyceridemia: Secondary | ICD-10-CM | POA: Diagnosis not present

## 2017-08-14 MED ORDER — TADALAFIL 5 MG PO TABS: 5.0000 mg | ORAL_TABLET | Freq: Every day | ORAL | 0 refills | Status: DC | PRN

## 2017-08-14 NOTE — Assessment & Plan Note (Signed)
Has quit, will mark as resolved.  

## 2017-08-14 NOTE — Assessment & Plan Note (Addendum)
He is doing well.  Will continue atripla, does not want to change.  Offered/refused condoms.  prevnar rtrc in 9 months.

## 2017-08-14 NOTE — Assessment & Plan Note (Signed)
Doing much better on increased dose of fibrate.  My great appreciation to PCP

## 2017-08-14 NOTE — Addendum Note (Signed)
Addended by: Towanda Octave on: 08/14/2017 04:18 PM   Modules accepted: Orders

## 2017-08-14 NOTE — Progress Notes (Signed)
   Subjective:    Patient ID: John Farrell, male    DOB: 12/19/1970, 47 y.o.   MRN: [id number]  HPI 47yo M with hx HIV+, mild hyperlipidemia, HTN, depression. On atripla, fibrate and lisinopril.  Going to [facility] this w/e. Going to [place] this summer.  No problems with art.  Had been ill (GI) prior to having blood draw.  Has quit smoking July 2017, allergies improved.  His fenofibrate was increased by his PCP.  Concerns about erection not staying.   HIV 1 RNA Quant (copies/mL)  Date Value  07/25/2017 <20 NOT DETECTED  08/02/2016 <20 NOT DETECTED  01/19/2016 <20   CD4 T Cell Abs (/uL)  Date Value  07/25/2017 240 (L)  08/02/2016 430  01/19/2016 380 (L)    Review of Systems  Constitutional: Negative for appetite change, chills, fatigue, fever and unexpected weight change.  Respiratory: Negative for shortness of breath.   Gastrointestinal: Negative for constipation and diarrhea.  Genitourinary: Negative for difficulty urinating.  Neurological: Negative for headaches.  Psychiatric/Behavioral: Negative for sleep disturbance.  Please see HPI. All other systems reviewed and negative.      Objective:   Physical Exam  Constitutional: He is oriented to person, place, and time. He appears well-developed and well-nourished.  HENT:  Mouth/Throat: No oropharyngeal exudate.  Eyes: Pupils are equal, round, and reactive to light. EOM are normal.  Neck: Normal range of motion. Neck supple.  Cardiovascular: Normal rate, regular rhythm and normal heart sounds.  Pulmonary/Chest: Effort normal and breath sounds normal.  Abdominal: Soft. Bowel sounds are normal. He exhibits no distension. There is no tenderness.  Musculoskeletal: He exhibits no edema.  Lymphadenopathy:    He has no cervical adenopathy.  Neurological: He is alert and oriented to person, place, and time.  Skin: Skin is warm and dry.      Assessment & Plan:

## 2017-08-14 NOTE — Assessment & Plan Note (Signed)
Will try cialis Offered/refused condoms.

## 2017-08-14 NOTE — Assessment & Plan Note (Signed)
Doing well on ACE-I Appreciate PCP f/u.

## 2017-08-23 ENCOUNTER — Ambulatory Visit: Payer: PRIVATE HEALTH INSURANCE | Admitting: Family Medicine

## 2017-08-23 ENCOUNTER — Encounter: Payer: Self-pay | Admitting: Family Medicine

## 2017-08-23 VITALS — BP 120/82 | HR 86 | Temp 98.5°F | Ht 71.0 in | Wt 174.1 lb

## 2017-08-23 DIAGNOSIS — E781 Pure hyperglyceridemia: Secondary | ICD-10-CM

## 2017-08-23 DIAGNOSIS — L301 Dyshidrosis [pompholyx]: Secondary | ICD-10-CM

## 2017-08-23 DIAGNOSIS — M76891 Other specified enthesopathies of right lower limb, excluding foot: Secondary | ICD-10-CM

## 2017-08-23 DIAGNOSIS — I10 Essential (primary) hypertension: Secondary | ICD-10-CM | POA: Diagnosis not present

## 2017-08-23 MED ORDER — CLOBETASOL PROPIONATE 0.05 % EX CREA: 1.0000 "application " | TOPICAL_CREAM | Freq: Two times a day (BID) | CUTANEOUS | 0 refills | Status: DC

## 2017-08-23 NOTE — Progress Notes (Signed)
Chief Complaint  Patient presents with  . Follow-up    Subjective John Farrell is a 47 y.o. male who presents for hypertension follow up. He does not monitor home blood pressures. He is compliant with medications. Patient has these side effects of medication: none He is usually adhering to a healthy diet overall. Current exercise: walking  Hypertriglyceridemia Patient presents for hypertriglyceridemia follow up. Currently being treated with Fenofibrate 145 mg/d and compliance with treatment thus far has been good. He is adhering to a healthy. The patient exercises intermittently.  The patient is not known to have coexisting coronary artery disease.  Intermittent R hip pain, worse with activity. Feels like a pull. No inj or change in activity noted.   Skin lesions on fingers that can be painful, sometimes itchy and red. Has not tried anything at home.    Past Medical History:  Diagnosis Date  . DEPRESSION, MILD   . GERD (gastroesophageal reflux disease)   . HIV infection (HCC)   . Hyperlipidemia   . Hypertension   . HYPERTRIGLYCERIDEMIA   . Sinusitis   . SYPHILIS   . UNSPECIFIED NEURALGIA NEURITIS AND RADICULITIS     Review of Systems Cardiovascular: no chest pain Respiratory:  no shortness of breath  Exam BP 120/82 (BP Location: Left Arm, Patient Position: Sitting, Cuff Size: Normal)   Pulse 86   Temp 98.5 F (36.9 C) (Oral)   Ht  (1.803 m)   Wt 174 lb 2 oz (79 kg)   SpO2 97%   BMI 24.29 kg/m  General:  well developed, well nourished, in no apparent distress Heart: RRR, no bruits, no LE edema Lungs: clear to auscultation, no accessory muscle use Skin: +erythematous papules on medial and lateral sides of index fingers b/l, no drainage or fluctuance Msk: +TTP over hip flexor, +Stinchfield, neg FABER, FADDIR, log roll Psych: well oriented with normal range of affect and appropriate judgment/insight  Essential  hypertension  HYPERTRIGLYCERIDEMIA  Dyshidrotic eczema - Plan: clobetasol cream (TEMOVATE) 0.05 %  Hip flexor tendinitis, right  Cont ACEi. Cont fibrate, ID team got lipid panel and TG's wnl on new dose. Counseled on diet and exercise. Cream for skin. Heat/ice, stretches/exercises for hip. I do not think jt is involved given exam. F/u in 6 mo for CPE. The patient voiced understanding and agreement to the plan.  Jilda Roche [place], DO 08/23/17  9:00 AM

## 2017-08-23 NOTE — Progress Notes (Signed)
Pre visit review using our clinic review tool, if applicable. No additional management support is needed unless otherwise documented below in the visit note. 

## 2017-08-23 NOTE — Patient Instructions (Signed)
Ice/cold pack over area for 10-15 min twice daily.  Heat (pad or rice pillow in microwave) over affected area, 10-15 minutes twice daily.   Keep the diet cleana nd stay active.   Hip Exercises It is normal to feel mild stretching, pulling, tightness, or discomfort as you do these exercises, but you should stop right away if you feel sudden pain or your pain gets worse.  STRETCHING AND RANGE OF MOTION EXERCISES These exercises warm up your muscles and joints and improve the movement and flexibility of your hip. These exercises also help to relieve pain, numbness, and tingling. Exercise A: Hamstrings, Supine  1. Lie on your back. 2. Loop a belt or towel over the ball of your left / rightfoot. The ball of your foot is on the walking surface, right under your toes. 3. Straighten your left / rightknee and slowly pull on the belt to raise your leg. ? Do not let your left / right knee bend while you do this. ? Keep your other leg flat on the floor. ? Raise the left / right leg until you feel a gentle stretch behind your left / right knee or thigh. 4. Hold this position for 30 seconds. 5. Slowly return your leg to the starting position. Repeat2 times. Complete this stretch 3 times per week. Exercise B: Hip Rotators  1. Lie on your back on a firm surface. 2. Hold your left / right knee with your left / right hand. Hold your ankle with your other hand. 3. Gently pull your left / right knee and rotate your lower leg toward your other shoulder. ? Pull until you feel a stretch in your buttocks. ? Keep your hips and shoulders firmly planted while you do this stretch. 4. Hold this position for 30 seconds. Repeat 2 times. Complete this stretch 3 times per week. Exercise C: V-Sit (Hamstrings and Adductors)  1. Sit on the floor with your legs extended in a large "V" shape. Keep your knees straight during this exercise. 2. Start with your head and chest upright, then bend at your waist to reach  for your left foot (position A). You should feel a stretch in your right inner thigh. 3. Hold this position for 30 seconds. Then slowly return to the upright position. 4. Bend at your waist to reach forward (position B). You should feel a stretch behind both of your thighs and knees. 5. Hold this position for 30 seconds. Then slowly return to the upright position. 6. Bend at your waist to reach for your right foot (position C). You should feel a stretch in your left inner thigh. 7. Hold this position for 30 seconds. Then slowly return to the upright position. Repeat A, B, and C 2 times each. Complete this stretch 3 times per week. Exercise D: Lunge (Hip Flexors)  1. Place your left / right knee on the floor and bend your other knee so that is directly over your ankle. You should be half-kneeling. 2. Keep good posture with your head over your shoulders. 3. Tighten your buttocks to point your tailbone downward. This helps your back to keep from arching too much. 4. You should feel a gentle stretch in the front of your left / right thigh and hip. If you do not feel any resistance, slightly slide your other foot forward and then slowly lunge forward so your knee once again lines up over your ankle. 5. Make sure your tailbone continues to point downward. 6. Hold this position for  30 seconds. Repeat 2 times. Complete this stretch 3 times per week.  STRENGTHENING EXERCISES These exercises build strength and endurance in your hip. Endurance is the ability to use your muscles for a long time, even after they get tired. Exercise E: Bridge (Hip Extensors)  1. Lie on your back on a firm surface with your knees bent and your feet flat on the floor. 2. Tighten your buttocks muscles and lift your bottom off the floor until the trunk of your body is level with your thighs. ? Do not arch your back. ? You should feel the muscles working in your buttocks and the back of your thighs. If you do not feel these  muscles, slide your feet 1-2 inches (2.5-5 cm) farther away from your buttocks. 3. Hold this position for 3 seconds. 4. Slowly lower your hips to the starting position. Repeat for a total of 10 repetitions. 5. Let your muscles relax completely between repetitions. 6. If this exercise is too easy, try doing it with your arms crossed over your chest. Repeat 2 times. Complete this exercise 3 times per week. Exercise F: Straight Leg Raises - Hip Abductors  1. Lie on your side with your left / right leg in the top position. Lie so your head, shoulder, knee, and hip line up with each other. You may bend your bottom knee to help you balance. 2. Roll your hips slightly forward, so your hips are stacked directly over each other and your left / right knee is facing forward. 3. Leading with your heel, lift your top leg 4-6 inches (10-15 cm). You should feel the muscles in your outer hip lifting. ? Do not let your foot drift forward. ? Do not let your knee roll toward the ceiling. 4. Hold this position for 1 second. 5. Slowly return to the starting position. 6. Let your muscles relax completely between repetitions. Repeat for a total of 10 repetitions.  Repeat 2 times. Complete this exercise 3 times per week. Exercise G: Straight Leg Raises - Hip Adductors  1. Lie on your side with your left / right leg in the bottom position. Lie so your head, shoulder, knee, and hip line up. You may place your upper foot in front to help you balance. 2. Roll your hips slightly forward, so your hips are stacked directly over each other and your left / right knee is facing forward. 3. Tense the muscles in your inner thigh and lift your bottom leg 4-6 inches (10-15 cm). 4. Hold this position for 1 second. 5. Slowly return to the starting position. 6. Let your muscles relax completely between repetitions. Repeat for a total of 10 repetitions. Repeat 2 times. Complete this exercise 3 times per week. Exercise H: Straight  Leg Raises - Quadriceps  1. Lie on your back with your left / right leg extended and your other knee bent. 2. Tense the muscles in the front of your left / right thigh. When you do this, you should see your kneecap slide up or see increased dimpling just above your knee. 3. Tighten these muscles even more and raise your leg 4-6 inches (10-15 cm) off the floor. 4. Hold this position for 3 seconds. 5. Keep these muscles tense as you lower your leg. 6. Relax the muscles slowly and completely between repetitions. Repeat for a total of 10 repetitions. Repeat 2 times. Complete this exercise 3 times per week. Exercise I: Hip Abductors, Standing 1. Tie one end of a rubber exercise band  or tubing to a secure surface, such as a table or pole. 2. Loop the other end of the band or tubing around your left / right ankle. 3. Keeping your ankle with the band or tubing directly opposite of the secured end, step away until there is tension in the tubing or band. Hold onto a chair as needed for balance. 4. Lift your left / right leg out to your side. While you do this: ? Keep your back upright. ? Keep your shoulders over your hips. ? Keep your toes pointing forward. ? Make sure to use your hip muscles to lift your leg. Do not "throw" your leg or tip your body to lift your leg. 5. Hold this position for 1 second. 6. Slowly return to the starting position. Repeat for a total of 10 repetitions. Repeat 2 times. Complete this exercise 3 times per week. Exercise J: Squats (Quadriceps) 1. Stand in a door frame so your feet and knees are in line with the frame. You may place your hands on the frame for balance. 2. Slowly bend your knees and lower your hips like you are going to sit in a chair. ? Keep your lower legs in a straight-up-and-down position. ? Do not let your hips go lower than your knees. ? Do not bend your knees lower than told by your health care provider. ? If your hip pain increases, do not bend as  low. 3. Hold this position for 1 second. 4. Slowly push with your legs to return to standing. Do not use your hands to pull yourself to standing. Repeat for a total of 10 repetitions. Repeat 2 times. Complete this exercise 3 times per week. Make sure you discuss any questions you have with your health care provider. Document Released: 03/31/2005 Document Revised: 12/06/2015 Document Reviewed: 03/08/2015 Elsevier Interactive Patient Education  [employer].

## 2017-09-05 ENCOUNTER — Encounter: Payer: Self-pay | Admitting: Family Medicine

## 2017-09-05 ENCOUNTER — Ambulatory Visit: Payer: PRIVATE HEALTH INSURANCE | Admitting: Family Medicine

## 2017-09-05 VITALS — BP 140/78 | HR 85 | Temp 97.9°F | Ht 71.0 in | Wt 176.1 lb

## 2017-09-05 DIAGNOSIS — K219 Gastro-esophageal reflux disease without esophagitis: Secondary | ICD-10-CM

## 2017-09-05 NOTE — Progress Notes (Signed)
Pre visit review using our clinic review tool, if applicable. No additional management support is needed unless otherwise documented below in the visit note. 

## 2017-09-05 NOTE — Patient Instructions (Addendum)
I am a fan of Orajel for symptom control for your sores in mouth.  Pepcid (famotidine) 20 mg twice daily to replace the omeprazole.  In 2 weeks, if the pinching pain comes back, let me know and we will order further testing.  If things go well, I will not plan on hearing from you.  Let us know if you need anything.

## 2017-09-05 NOTE — Progress Notes (Signed)
Chief Complaint  Patient presents with  . Gastroesophageal Reflux     Subjective John Farrell is a 47 y.o. male who presents with GI upset.  A couple weeks ago, the patient had a 3-day stretch of abdominal pain, nausea, and diarrhea.  This is since resolved, however last week he had left upper quadrant pain.  He describes the pain is pinching and was constant.  Made him quite concerned.  Eating actually made it better as well as standing up.  Nothing he noticed made it worse.  He has also been having aphthous ulcers in his mouth.  He changed toothpaste and stopped having them as frequently.  No ulcerations anywhere else on his body.  No fevers, injury, illness, or increased stress.  Past Medical History:  Diagnosis Date  . DEPRESSION, MILD   . GERD (gastroesophageal reflux disease)   . HIV infection (HCC)   . Hyperlipidemia   . Hypertension   . HYPERTRIGLYCERIDEMIA   . Sinusitis   . SYPHILIS   . UNSPECIFIED NEURALGIA NEURITIS AND RADICULITIS    No Known Allergies  Review of Systems Constitutional:  No fevers or chills Ear/Nose/Mouth/Throat:  No red eyes Gastrointestinal:  As noted in the HPI Musculoskeletal/Extremities: no myalgias Integumentary (Skin/Breast): no rash  Exam BP 140/78 (BP Location: Left Arm, Patient Position: Sitting, Cuff Size: Normal)   Pulse 85   Temp 97.9 F (36.6 C) (Oral)   Ht 5\' 11"  (1.803 m)   Wt 176 lb 2 oz (79.9 kg)   SpO2 96%   BMI 24.56 kg/m  General:  well developed, well hydrated, in no apparent distress Skin:  warm, no pallor or diaphoresis, no rashes Throat/Pharynx:  lips and gingiva without lesion; tongue and uvula midline; non-inflamed pharynx; no exudates or postnasal drainage Neck: neck supple without adenopathy, thyromegaly, or masses Lungs:  clear to auscultation, breath sounds equal bilaterally, no respiratory distress, no wheezes Cardio:  regular rate and rhythm without murmurs Abdomen:  abdomen soft, nontender; bowel  sounds normal; no masses or organomegaly Extremities:  no clubbing, cyanosis, or edema Psych: Appropriate judgement/insight  Assessment and Plan  Gastroesophageal reflux disease, esophagitis presence not specified  Temporarily stop PPI, start famotidine.  In 2 weeks, if the pinching pain comes back, will test for H. pylori.  If things worsen, will refer to gastroenterology. Orajel for aphthous ulcers. F/u prn.  The patient voiced understanding and agreement to the plan.  Jilda Rocheicholas Paul [place], DO 09/05/17  10:50 AM

## 2017-09-25 ENCOUNTER — Other Ambulatory Visit: Payer: Self-pay | Admitting: Infectious Diseases

## 2017-09-25 DIAGNOSIS — B20 Human immunodeficiency virus [HIV] disease: Secondary | ICD-10-CM

## 2017-09-30 ENCOUNTER — Other Ambulatory Visit: Payer: Self-pay | Admitting: Infectious Diseases

## 2017-09-30 DIAGNOSIS — I10 Essential (primary) hypertension: Secondary | ICD-10-CM

## 2017-11-02 ENCOUNTER — Telehealth: Payer: Self-pay | Admitting: Family Medicine

## 2017-11-02 MED ORDER — FENOFIBRATE 145 MG PO TABS: 145.0000 mg | ORAL_TABLET | Freq: Every day | ORAL | 0 refills | Status: DC

## 2017-11-02 NOTE — Telephone Encounter (Signed)
Copied from CRM [id number]. Topic: Quick Communication - See Telephone Encounter >> Nov 02, 2017  8:21 AM Tamela OddiMartin, [place], NT wrote: CRM for notification. See Telephone encounter for: 11/02/17. Patient called and states that he needs a refill of his fenofibrate (TRICOR) 145 MG tablet. Patient is going on vacation on Sunday and needs this refilled before then.  [facility] [place] - [address] [phone] (Phone) [phone] (Fax)

## 2018-02-14 ENCOUNTER — Other Ambulatory Visit: Payer: Self-pay | Admitting: Family Medicine

## 2018-05-02 ENCOUNTER — Other Ambulatory Visit (HOSPITAL_COMMUNITY)
Admission: RE | Admit: 2018-05-02 | Discharge: 2018-05-02 | Disposition: A | Discharge status: Home / Self Care | Payer: PRIVATE HEALTH INSURANCE | Source: Ambulatory Visit | Attending: Infectious Diseases | Admitting: Infectious Diseases

## 2018-05-02 ENCOUNTER — Other Ambulatory Visit: Payer: PRIVATE HEALTH INSURANCE

## 2018-05-02 DIAGNOSIS — Z113 Encounter for screening for infections with a predominantly sexual mode of transmission: Secondary | ICD-10-CM | POA: Insufficient documentation

## 2018-05-02 DIAGNOSIS — E781 Pure hyperglyceridemia: Secondary | ICD-10-CM

## 2018-05-02 DIAGNOSIS — B20 Human immunodeficiency virus [HIV] disease: Secondary | ICD-10-CM

## 2018-05-03 LAB — T-HELPER CELL (CD4) - (RCID CLINIC ONLY)
CD4 T CELL HELPER: 18 % — AB (ref 33–55)
CD4 T Cell Abs: 380 /uL — ABNORMAL LOW (ref 400–2700)

## 2018-05-04 LAB — COMPREHENSIVE METABOLIC PANEL
AG Ratio: 1.5 (calc) (ref 1.0–2.5)
ALT: 46 U/L (ref 9–46)
AST: 56 U/L — ABNORMAL HIGH (ref 10–40)
Albumin: 4.3 g/dL (ref 3.6–5.1)
Alkaline phosphatase (APISO): 52 U/L (ref 36–130)
BUN: 15 mg/dL (ref 7–25)
CHLORIDE: 103 mmol/L (ref 98–110)
CO2: 27 mmol/L (ref 20–32)
Calcium: 9.4 mg/dL (ref 8.6–10.3)
Creat: 0.93 mg/dL (ref 0.60–1.35)
GLOBULIN: 2.8 g/dL (ref 1.9–3.7)
Glucose, Bld: 96 mg/dL (ref 65–99)
Potassium: 4.5 mmol/L (ref 3.5–5.3)
Sodium: 137 mmol/L (ref 135–146)
Total Bilirubin: 0.4 mg/dL (ref 0.2–1.2)
Total Protein: 7.1 g/dL (ref 6.1–8.1)

## 2018-05-04 LAB — LIPID PANEL
Cholesterol: 198 mg/dL (ref <200)
HDL: 38 mg/dL — ABNORMAL LOW (ref >=40)
LDL Cholesterol (Calc): 132 mg/dL (calc) — ABNORMAL HIGH
Non-HDL Cholesterol (Calc): 160 mg/dL (calc) — ABNORMAL HIGH (ref <130)
Total CHOL/HDL Ratio: 5.2 (calc) — ABNORMAL HIGH (ref <5.0)
Triglycerides: 152 mg/dL — ABNORMAL HIGH (ref <150)

## 2018-05-04 LAB — CBC
HCT: 44.6 % (ref 38.5–50.0)
Hemoglobin: 14.9 g/dL (ref 13.2–17.1)
MCH: 28.4 pg (ref 27.0–33.0)
MCHC: 33.4 g/dL (ref 32.0–36.0)
MCV: 85.1 fL (ref 80.0–100.0)
MPV: 8.9 fL (ref 7.5–12.5)
Platelets: 272 10*3/uL (ref 140–400)
RBC: 5.24 10*6/uL (ref 4.20–5.80)
RDW: 14.2 % (ref 11.0–15.0)
WBC: 5.3 10*3/uL (ref 3.8–10.8)

## 2018-05-04 LAB — HIV-1 RNA QUANT-NO REFLEX-BLD
HIV 1 RNA QUANT: NOT DETECTED {copies}/mL
HIV-1 RNA QUANT, LOG: NOT DETECTED {Log_copies}/mL

## 2018-05-04 LAB — URINE CYTOLOGY ANCILLARY ONLY
Chlamydia: NEGATIVE
Neisseria Gonorrhea: NEGATIVE

## 2018-05-04 LAB — RPR: RPR Ser Ql: NONREACTIVE

## 2018-05-16 ENCOUNTER — Encounter: Payer: PRIVATE HEALTH INSURANCE | Admitting: Infectious Diseases

## 2018-05-17 ENCOUNTER — Encounter: Payer: Self-pay | Admitting: Infectious Diseases

## 2018-05-17 ENCOUNTER — Ambulatory Visit: Payer: PRIVATE HEALTH INSURANCE | Admitting: Infectious Diseases

## 2018-05-17 DIAGNOSIS — G8929 Other chronic pain: Secondary | ICD-10-CM | POA: Diagnosis not present

## 2018-05-17 DIAGNOSIS — I10 Essential (primary) hypertension: Secondary | ICD-10-CM | POA: Diagnosis not present

## 2018-05-17 DIAGNOSIS — M25512 Pain in left shoulder: Secondary | ICD-10-CM | POA: Diagnosis not present

## 2018-05-17 DIAGNOSIS — B20 Human immunodeficiency virus [HIV] disease: Secondary | ICD-10-CM

## 2018-05-17 MED ORDER — BICTEGRAVIR-EMTRICITAB-TENOFOV 50-200-25 MG PO TABS: 1.0000 | ORAL_TABLET | Freq: Every day | ORAL | 3 refills | Status: DC

## 2018-05-17 NOTE — Assessment & Plan Note (Signed)
He is doing well Will f/u with PCP Continue ACE-I

## 2018-05-17 NOTE — Assessment & Plan Note (Signed)
Doing well Will change his art to  Offered/refused condoms.  Married, HIV- partner, on prep.  Has gotten flu vax.  rtc in 1 year.

## 2018-05-17 NOTE — Assessment & Plan Note (Signed)
Negative stress.  Able to work out without SOB, weakness, CP.  Takes no OTC, no ice or heat.

## 2018-05-17 NOTE — Addendum Note (Signed)
Addended by: Gildardo Griffes on: 05/17/2018 09:31 AM   Modules accepted: Orders

## 2018-05-17 NOTE — Progress Notes (Signed)
   Subjective:    Patient ID: John Farrell, male    DOB: 08/02/1970, 48 y.o.   MRN: [id number]  HPI 48yo M with hx HIV+, mild hyperlipidemia, HTN, depression. On atripla, fibrate and lisinopril. Has been doing well. Had nice travel over the last summer.  Married HIV- partner on prep   HIV 1 RNA Quant (copies/mL)  Date Value  05/02/2018 <20 NOT DETECTED  07/25/2017 <20 NOT DETECTED  08/02/2016 <20 NOT DETECTED   CD4 T Cell Abs (/uL)  Date Value  05/02/2018 380 (L)  07/25/2017 240 (L)  08/02/2016 430    Review of Systems  Constitutional: Negative for appetite change and unexpected weight change.  Gastrointestinal: Negative for constipation and diarrhea.  Genitourinary: Negative for difficulty urinating.  Psychiatric/Behavioral: Negative for dysphoric mood.  Please see HPI. All other systems reviewed and negative. Has had colon 4 yr ago.     Objective:   Physical Exam Constitutional:      Appearance: Normal appearance.  HENT:     Mouth/Throat:     Mouth: Mucous membranes are moist.     Pharynx: No oropharyngeal exudate.  Eyes:     Extraocular Movements: Extraocular movements intact.     Pupils: Pupils are equal, round, and reactive to light.  Neck:     Musculoskeletal: Normal range of motion and neck supple.  Cardiovascular:     Rate and Rhythm: Normal rate and regular rhythm.  Pulmonary:     Effort: Pulmonary effort is normal.     Breath sounds: Normal breath sounds.  Abdominal:     General: Bowel sounds are normal.     Palpations: Abdomen is soft.  Musculoskeletal:     Right lower leg: No edema.     Left lower leg: No edema.  Skin:    General: Skin is warm and dry.  Neurological:     General: No focal deficit present.     Mental Status: He is alert.  Psychiatric:        Mood and Affect: Mood normal.        Assessment & Plan:

## 2018-05-20 ENCOUNTER — Other Ambulatory Visit: Payer: Self-pay | Admitting: Family Medicine

## 2018-05-20 MED ORDER — FENOFIBRATE 145 MG PO TABS: 145.0000 mg | ORAL_TABLET | Freq: Every day | ORAL | 0 refills | Status: DC

## 2018-05-20 NOTE — Telephone Encounter (Signed)
Requested Prescriptions  Pending Prescriptions Disp Refills  . fenofibrate (TRICOR) 145 MG tablet 90 tablet 0    Sig: Take 1 tablet (145 mg total) by mouth daily.     Cardiovascular:  Antilipid - Fibric Acid Derivatives Failed - 05/20/2018 11:32 AM      Failed - LDL in normal range and within 360 days    LDL Cholesterol (Calc)  Date Value Ref Range Status  05/02/2018 132 (H) mg/dL (calc) Final    Comment:    Reference range: <100 . Desirable range <100 mg/dL for primary prevention;   <70 mg/dL for patients with CHD or diabetic patients  with > or = 2 CHD risk factors. Marland Kitchen LDL-C is now calculated using the Martin-Hopkins  calculation, which is a validated novel method providing  better accuracy than the Friedewald equation in the  estimation of LDL-C.  Cresenciano Genre et al. Annamaria Helling. 9675;916(38): 2061-2068  (http://education.[facility].com/faq/FAQ164)          Failed - HDL in normal range and within 360 days    HDL  Date Value Ref Range Status  05/02/2018 38 (L) > OR = 40 mg/dL Final         Failed - Triglycerides in normal range and within 360 days    Triglycerides  Date Value Ref Range Status  05/02/2018 152 (H) <150 mg/dL Final         Failed - AST in normal range and within 180 days    AST  Date Value Ref Range Status  05/02/2018 56 (H) 10 - 40 U/L Final         Failed - eGFR in normal range and within 180 days    GFR, Est African American  Date Value Ref Range Status  06/03/2013 >89 mL/min Final   GFR, Est Non African American  Date Value Ref Range Status  06/03/2013 >89 mL/min Final    Comment:      The estimated GFR is a calculation valid for adults (>=32 years old) that uses the CKD-EPI algorithm to adjust for age and sex. It is   not to be used for children, pregnant women, hospitalized patients,    patients on dialysis, or with rapidly changing kidney function. According to the NKDEP, eGFR >89 is normal, 60-89 shows mild impairment, 30-59 shows moderate  impairment, 15-29 shows severe impairment and <15 is ESRD.     GFR  Date Value Ref Range Status  02/22/2017 91.34 >60.00 mL/min Final         Passed - Total Cholesterol in normal range and within 360 days    Cholesterol  Date Value Ref Range Status  05/02/2018 198 <200 mg/dL Final         Passed - ALT in normal range and within 180 days    ALT  Date Value Ref Range Status  05/02/2018 46 9 - 46 U/L Final         Passed - Cr in normal range and within 180 days    Creat  Date Value Ref Range Status  05/02/2018 0.93 0.60 - 1.35 mg/dL Final         Passed - Valid encounter within last 12 months    Recent Outpatient Visits          8 months ago Gastroesophageal reflux disease, esophagitis presence not specified   [facility], [place], [place]   9 months ago Essential hypertension   [facility] at [facility]  [place], [place], [place]   1 year ago Well adult exam   [facility], [place], [place]      Future Appointments            In 2 days Nani Ravens, [place] Oyster, [place] at [facility], [place]

## 2018-05-20 NOTE — Telephone Encounter (Signed)
Copied from CRM [id number]. Topic: Quick Communication - Rx Refill/Question >> May 20, 2018  8:26 AM Elliot Gault wrote: Medication: fenofibrate (TRICOR) 145 MG tablet   Has the patient contacted their pharmacy? yes   (Agent: If yes, when and what did the pharmacy advise?) contacted PCP office twice regarding request.   Preferred Pharmacy (with phone number or street name):  [facility] [address] - [place], [place] - [address] [phone] (Phone) [phone] (Fax)  Agent: Please be advised that RX refills may take up to 3 business days. We ask that you follow-up with your pharmacy.

## 2018-05-22 ENCOUNTER — Encounter: Payer: Self-pay | Admitting: Family Medicine

## 2018-05-22 ENCOUNTER — Ambulatory Visit: Payer: PRIVATE HEALTH INSURANCE | Admitting: Family Medicine

## 2018-05-22 VITALS — BP 132/76 | HR 73 | Temp 98.8°F | Ht 71.0 in | Wt 175.2 lb

## 2018-05-22 DIAGNOSIS — M9902 Segmental and somatic dysfunction of thoracic region: Secondary | ICD-10-CM

## 2018-05-22 NOTE — Patient Instructions (Addendum)
Heat (pad or rice pillow in microwave) over affected area, 10-15 minutes twice daily.   Mid-Back Rehab It is normal to feel mild stretching, pulling, tightness, or discomfort as you do these exercises, but you should stop right away if you feel sudden pain or your pain gets worse.   Stretching and range of motion exercises This exercise warms up your muscles and joints and improves the movement and flexibility of your back and shoulders. This exercise also help to relieve pain. Exercise A: Chest and spine stretch    1. Lie down on your back on a firm surface. 2. Roll a towel or a small blanket so it is about 4 inches (10 cm) in diameter. 3. Put the towel lengthwise under the middle of your back so it is under your spine, but not under your shoulder blades. 4. To increase the stretch, you may put your hands behind your head and let your elbows fall to your sides. 5. Hold for 30 seconds. Repeat exercise 2 times. Complete this exercise 3 times per week.  Strengthening exercises These exercises build strength and endurance in your back and your shoulder blade muscles. Endurance is the ability to use your muscles for a long time, even after they get tired. Exercise B: Alternating arm and leg raises    1. Get on your hands and knees on a firm surface. If you are on a hard floor, you may want to use padding to cushion your knees, such as an exercise mat. 2. Line up your arms and legs. Your hands should be below your shoulders, and your knees should be below your hips. 3. Lift your left leg behind you. At the same time, raise your right arm and straighten it in front of you. ? Do not lift your leg higher than your hip. ? Do not lift your arm higher than your shoulder. ? Keep your abdominal and back muscles tight. ? Keep your hips facing the ground. ? Do not arch your back. ? Keep your balance carefully, and do not hold your breath. 4. Hold for 3 seconds. 5. Slowly return to the starting  position and repeat with your right leg and your left arm. Repeat 2 times. Complete this exercise 3 times per week. Exercise C: Straight arm rows (shoulder extension)     1. Stand with your feet shoulder width apart. 2. Secure an exercise band to a stable object in front of you so the band is at or above shoulder height. 3. Hold one end of the exercise band in each hand. 4. Straighten your elbows and lift your hands up to shoulder height. 5. Step back, away from the secured end of the exercise band, until the band stretches. 6. Squeeze your shoulder blades together and pull your hands down to the sides of your thighs. Stop when your hands are straight down by your sides. Do not let your hands go behind your body. 7. Hold for 3 seconds. 8. Slowly return to the starting position. Repeat 2 times. Complete this exercise 3 times per week. Exercise D: Shoulder external rotation, prone 1. Lie on your abdomen on a firm bed so your left / right forearm hangs over the edge of the bed and your upper arm is on the bed, straight out from your body. ? Your elbow should be bent. ? Your palm should be facing your feet. 2. If instructed, hold a 5 lb weight in your hand. 3. Squeeze your shoulder blade toward the middle of  your back. Do not let your shoulder lift toward your ear. 4. Keep your elbow bent in an "L" shape (90 degrees) while you slowly move your forearm up toward the ceiling. Move your forearm up to the height of the bed, toward your head. ? Your upper arm should not move. ? At the top of the movement, your palm should face the floor. 5. Hold for 3 seconds. 6. Slowly return to the starting position and relax your muscles. Repeat 3 times. Complete this exercise 3 times per week. Exercise E: Scapular retraction and external rotation, rowing    1. Sit in a stable chair without armrests, or stand. 2. Secure an exercise band to a stable object in front of you so it is at shoulder height. 3. Hold  one end of the exercise band in each hand. 4. Bring your arms out straight in front of you. 5. Step back, away from the secured end of the exercise band, until the band stretches. 6. Pull the band backward. As you do this, bend your elbows and squeeze your shoulder blades together, but avoid letting the rest of your body move. Do not let your shoulders lift up toward your ears. 7. Stop when your elbows are at your sides or slightly behind your body. 8. Hold for 5 seconds. 9. Slowly straighten your arms to return to the starting position. Repeat 2 times. Complete this exercise 3 times per week. Posture and body mechanics    Body mechanics refers to the movements and positions of your body while you do your daily activities. Posture is part of body mechanics. Good posture and healthy body mechanics can help to relieve stress in your body's tissues and joints. Good posture means that your spine is in its natural S-curve position (your spine is neutral), your shoulders are pulled back slightly, and your head is not tipped forward. The following are general guidelines for applying improved posture and body mechanics to your everyday activities. Standing     When standing, keep your spine neutral and your feet about hip-width apart. Keep a slight bend in your knees. Your ears, shoulders, and hips should line up.  When you do a task in which you lean forward while standing in one place for a long time, place one foot up on a stable object that is 2-4 inches (5-10 cm) high, such as a footstool. This helps keep your spine neutral. Sitting     When sitting, keep your spine neutral and keep your feet flat on the floor. Use a footrest, if necessary, and keep your thighs parallel to the floor. Avoid rounding your shoulders, and avoid tilting your head forward.  When working at a desk or a computer, keep your desk at a height where your hands are slightly lower than your elbows. Slide your chair under  your desk so you are close enough to maintain good posture.  When working at a computer, place your monitor at a height where you are looking straight ahead and you do not have to tilt your head forward or downward to look at the screen. Resting    When lying down and resting, avoid positions that are most painful for you.  If you have pain with activities such as sitting, bending, stooping, or squatting (flexion-based activities), lie in a position in which your body does not bend very much. For example, avoid curling up on your side with your arms and knees near your chest (fetal position).  If you have  pain with activities such as standing for a long time or reaching with your arms (extension-based activities), lie with your spine in a neutral position and bend your knees slightly. Try the following positions:  Lying on your side with a pillow between your knees.  Lying on your back with a pillow under your knees.   Lifting     When lifting objects, keep your feet at least shoulder-width apart and tighten your abdominal muscles.  Bend your knees and hips and keep your spine neutral. It is important to lift using the strength of your legs, not your back. Do not lock your knees straight out.  Always ask for help to lift heavy or awkward objects. Make sure you discuss any questions you have with your health care provider.

## 2018-05-22 NOTE — Progress Notes (Signed)
Musculoskeletal Exam  Patient: John Farrell DOB: Dec 23, 1970  DOS: 05/22/2018  SUBJECTIVE:  Chief Complaint:   Chief Complaint  Patient presents with  . Follow-up  . Shoulder Pain    left    John Farrell is a 48 y.o.  male for evaluation and treatment of L shoulder pain.   Onset:  2 months ago. No inj or change in activity. Had issues upt o 6 yrs ago.  Location: L trap area Character:  aching and dull  Progression of issue:  has worsened Associated symptoms: denies swelling, bruising, rashes, ROM issues Treatment: to date has been changing positions (with electronics), Tylenol.   Neurovascular symptoms: no  ROS: Musculoskeletal/Extremities: +L shoulder pain  Past Medical History:  Diagnosis Date  . DEPRESSION, MILD   . GERD (gastroesophageal reflux disease)   . HIV infection (HCC)   . Hyperlipidemia   . Hypertension   . HYPERTRIGLYCERIDEMIA   . Sinusitis   . SYPHILIS   . UNSPECIFIED NEURALGIA NEURITIS AND RADICULITIS     Objective: VITAL SIGNS: BP 132/76 (BP Location: Left Arm, Patient Position: Sitting, Cuff Size: Normal)   Pulse 73   Temp 98.8 F (37.1 C) (Oral)   Ht 5\' 11"  (1.803 m)   Wt 175 lb 4 oz (79.5 kg)   SpO2 96%   BMI 24.44 kg/m  Constitutional: Well formed, well developed. No acute distress. Cardiovascular: Brisk cap refill Thorax & Lungs: No accessory muscle use Musculoskeletal: +TTP over the L rhomboids.   Normal active range of motion: yes.   Normal passive range of motion: yes Deformity: no Ecchymosis: no R rotation of thoracic vertebrae, posterior deviation of rib angles in thoracic region on L Neurologic: Normal sensory function. No focal deficits noted. DTR's equal and symmetry in UE's. No clonus. Psychiatric: Normal mood. Age appropriate judgment and insight. Alert & oriented x 3.    Assessment:  Somatic dysfunction of thoracic region  Plan: Orders as above. Stretches/exercises, heat. If no improvement, PT. F/u in 4  weeks for CPE. The patient voiced understanding and agreement to the plan.   Jilda Roche [place], DO 05/22/18  3:28 PM

## 2018-05-28 ENCOUNTER — Other Ambulatory Visit: Payer: Self-pay

## 2018-05-28 DIAGNOSIS — B20 Human immunodeficiency virus [HIV] disease: Secondary | ICD-10-CM

## 2018-05-28 MED ORDER — BICTEGRAVIR-EMTRICITAB-TENOFOV 50-200-25 MG PO TABS: 1.0000 | ORAL_TABLET | Freq: Every day | ORAL | 3 refills | Status: DC

## 2018-05-29 ENCOUNTER — Telehealth: Payer: Self-pay

## 2018-05-29 NOTE — Telephone Encounter (Signed)
PA for patient's Biktarvy 90 tabs was submitted through covermymeds. On 05/28/18. Received a call from [facility] rx liaison who states that patient's PA for 90 day supply was denied,however,  30 day supply for John Farrell was covered under insurance. Pharmacy states they will go ahead and move forward with 30 day supply for patient. Lorenso Courier, [place]

## 2018-05-31 ENCOUNTER — Ambulatory Visit: Payer: Self-pay | Admitting: *Deleted

## 2018-05-31 DIAGNOSIS — Z0000 Encounter for general adult medical examination without abnormal findings: Secondary | ICD-10-CM

## 2018-05-31 NOTE — Progress Notes (Signed)
[employer] insurance premium discount evaluation: Labs Drawn. Only A1c needed as all other labs, Ht/Wt, BP obtained through pcp OV 05/22/18. [employer] ROI form signed. Tobacco Free Attestation form signed.  Forms placed in paper chart.

## 2018-06-01 LAB — HGB A1C W/O EAG: Hgb A1c MFr Bld: 5.8 % — ABNORMAL HIGH (ref 4.8–5.6)

## 2018-06-03 NOTE — Progress Notes (Signed)
Result reviewed with pt via phone. Pt reports that he has already reviewed result and NP notes in MyChart and denies any questions/concerns.

## 2018-06-03 NOTE — Progress Notes (Signed)
noted 

## 2018-06-04 ENCOUNTER — Other Ambulatory Visit: Payer: Self-pay | Admitting: *Deleted

## 2018-06-04 ENCOUNTER — Other Ambulatory Visit: Payer: Self-pay | Admitting: Infectious Diseases

## 2018-06-04 DIAGNOSIS — B20 Human immunodeficiency virus [HIV] disease: Secondary | ICD-10-CM

## 2018-06-04 MED ORDER — BICTEGRAVIR-EMTRICITAB-TENOFOV 50-200-25 MG PO TABS: 1.0000 | ORAL_TABLET | Freq: Every day | ORAL | 8 refills | Status: DC

## 2018-06-04 NOTE — Telephone Encounter (Signed)
Insurance requesting additional information for [employer]. Office note and lab work faxed to Rx Benefits. Waiting on a response from insurance company. Rx Benefits: F: [phone] Lorenso Courier, CMA

## 2018-06-04 NOTE — Telephone Encounter (Signed)
PA approved until 05/31/19. Pharmacy was notified of PA approval. Lorenso Courier, CMA

## 2018-06-16 ENCOUNTER — Other Ambulatory Visit: Payer: Self-pay | Admitting: Infectious Diseases

## 2018-06-16 DIAGNOSIS — I10 Essential (primary) hypertension: Secondary | ICD-10-CM

## 2018-08-11 ENCOUNTER — Other Ambulatory Visit: Payer: Self-pay | Admitting: Family Medicine

## 2018-08-12 ENCOUNTER — Other Ambulatory Visit: Payer: Self-pay | Admitting: Family Medicine

## 2018-08-12 MED ORDER — FENOFIBRATE 145 MG PO TABS: 145.0000 mg | ORAL_TABLET | Freq: Every day | ORAL | 1 refills | Status: DC

## 2018-08-12 NOTE — Telephone Encounter (Signed)
Rx sent 

## 2018-08-12 NOTE — Telephone Encounter (Signed)
Copied from CRM [id number]. Topic: Quick Communication - Rx Refill/Question >> Aug 12, 2018  9:42 AM Jaquita Rector A wrote: Medication: fenofibrate (TRICOR) 145 MG tablet  Per patient he have 9 pills left  Has the patient contacted their pharmacy? Yes.   (Agent: If no, request that the patient contact the pharmacy for the refill.) (Agent: If yes, when and what did the pharmacy advise?)  Preferred Pharmacy (with phone number or street name): [facility] [address] - [place], [place] - [address] [phone] (Phone) [phone] (Fax)    Agent: Please be advised that RX refills may take up to 3 business days. We ask that you follow-up with your pharmacy.

## 2018-08-29 ENCOUNTER — Other Ambulatory Visit: Payer: Self-pay

## 2018-08-29 ENCOUNTER — Other Ambulatory Visit: Payer: PRIVATE HEALTH INSURANCE

## 2018-08-29 DIAGNOSIS — B20 Human immunodeficiency virus [HIV] disease: Secondary | ICD-10-CM

## 2018-08-30 LAB — T-HELPER CELL (CD4) - (RCID CLINIC ONLY)
CD4 % Helper T Cell: 20 % — ABNORMAL LOW (ref 33–65)
CD4 T Cell Abs: 455 /uL (ref 400–1790)

## 2018-09-06 ENCOUNTER — Other Ambulatory Visit: Payer: PRIVATE HEALTH INSURANCE

## 2018-09-12 ENCOUNTER — Encounter: Payer: PRIVATE HEALTH INSURANCE | Admitting: Infectious Diseases

## 2018-09-12 LAB — HIV-1 RNA QUANT-NO REFLEX-BLD
HIV 1 RNA Quant: <20 copies/mL — AB
HIV-1 RNA Quant, Log: <1.3 Log copies/mL — AB

## 2018-09-18 ENCOUNTER — Telehealth: Payer: Self-pay | Admitting: Infectious Diseases

## 2018-09-18 NOTE — Telephone Encounter (Signed)
COVID-19 Pre-Screening Questions: ° °Do you currently have a fever (>100 °F), chills or unexplained body aches?no  ° °Are you currently experiencing new cough, shortness of breath, sore throat, runny nose?no  °•  °Have you recently travelled outside the state of [place] in the last 14 days? No  °•  °1. Have you been in contact with someone that is currently pending confirmation of Covid19 testing or has been confirmed to have the Covid19 virus?  No  ° °

## 2018-09-19 ENCOUNTER — Ambulatory Visit: Payer: PRIVATE HEALTH INSURANCE | Admitting: Infectious Diseases

## 2018-09-19 ENCOUNTER — Encounter: Payer: Self-pay | Admitting: Infectious Diseases

## 2018-09-19 ENCOUNTER — Other Ambulatory Visit: Payer: Self-pay

## 2018-09-19 VITALS — BP 148/79 | HR 71 | Temp 98.3°F | Wt 180.0 lb

## 2018-09-19 DIAGNOSIS — Z79899 Other long term (current) drug therapy: Secondary | ICD-10-CM

## 2018-09-19 DIAGNOSIS — Z23 Encounter for immunization: Secondary | ICD-10-CM

## 2018-09-19 DIAGNOSIS — B20 Human immunodeficiency virus [HIV] disease: Secondary | ICD-10-CM

## 2018-09-19 DIAGNOSIS — Z113 Encounter for screening for infections with a predominantly sexual mode of transmission: Secondary | ICD-10-CM | POA: Diagnosis not present

## 2018-09-19 DIAGNOSIS — I10 Essential (primary) hypertension: Secondary | ICD-10-CM | POA: Diagnosis not present

## 2018-09-19 DIAGNOSIS — N521 Erectile dysfunction due to diseases classified elsewhere: Secondary | ICD-10-CM | POA: Diagnosis not present

## 2018-09-19 NOTE — Addendum Note (Signed)
Addended by: Lenore Cordia on: 09/19/2018 04:49 PM   Modules accepted: Orders

## 2018-09-19 NOTE — Assessment & Plan Note (Signed)
He is doing well Will update PCV 23 Partner is on prep Will see him back in 9 months

## 2018-09-19 NOTE — Progress Notes (Signed)
   Subjective:    Patient ID: John Farrell, male    DOB: 1970-07-26, 48 y.o.   MRN: [id number]  HPI 48yo M with hx HIV+, mild hyperlipidemia, HTN, depression.   Married HIV- partner on prep- family is in [place], stressed as he cannot he cannot go visit.  When seen 04-2018 his ART was changed from [place] to biktarvy.  Has not noted any difference in his new meds.  Is working from home.  asx from BP.  Walks and has been using treadmill.   HIV 1 RNA Quant (copies/mL)  Date Value  08/29/2018 <20 DETECTED (A)  05/02/2018 <20 NOT DETECTED  07/25/2017 <20 NOT DETECTED   CD4 T Cell Abs (/uL)  Date Value  08/29/2018 455  05/02/2018 380 (L)  07/25/2017 240 (L)   No sick exposures.   Review of Systems  Constitutional: Negative for appetite change, chills, fever and unexpected weight change.  Respiratory: Negative for cough and shortness of breath.   Cardiovascular: Negative for chest pain.  Gastrointestinal: Negative for constipation and diarrhea.  Genitourinary: Negative for difficulty urinating.  Neurological: Negative for headaches.  Please see HPI. All other systems reviewed and negative.      Objective:   Physical Exam Constitutional:      Appearance: Normal appearance.  HENT:     Mouth/Throat:     Mouth: Mucous membranes are moist.     Pharynx: No oropharyngeal exudate.  Eyes:     Extraocular Movements: Extraocular movements intact.     Pupils: Pupils are equal, round, and reactive to light.  Neck:     Musculoskeletal: Normal range of motion.  Cardiovascular:     Rate and Rhythm: Normal rate and regular rhythm.  Pulmonary:     Effort: Pulmonary effort is normal.     Breath sounds: Normal breath sounds.  Abdominal:     General: Bowel sounds are normal. There is no distension.     Palpations: Abdomen is soft.     Tenderness: There is no abdominal tenderness.  Musculoskeletal:     Right lower leg: No edema.     Left lower leg: No edema.  Neurological:   General: No focal deficit present.     Mental Status: He is alert.  Psychiatric:        Mood and Affect: Mood normal.           Assessment & Plan:

## 2018-09-19 NOTE — Assessment & Plan Note (Signed)
He is asx  Will f/u with PCP

## 2018-09-19 NOTE — Assessment & Plan Note (Signed)
Offered to refill his Cialis but he still has his original rx

## 2018-09-20 ENCOUNTER — Encounter: Payer: PRIVATE HEALTH INSURANCE | Admitting: Infectious Diseases

## 2018-10-09 ENCOUNTER — Other Ambulatory Visit: Payer: Self-pay

## 2018-10-11 ENCOUNTER — Other Ambulatory Visit: Payer: Self-pay

## 2018-10-11 ENCOUNTER — Ambulatory Visit (INDEPENDENT_AMBULATORY_CARE_PROVIDER_SITE_OTHER): Payer: PRIVATE HEALTH INSURANCE | Admitting: Family Medicine

## 2018-10-11 ENCOUNTER — Encounter: Payer: Self-pay | Admitting: Family Medicine

## 2018-10-11 DIAGNOSIS — J0100 Acute maxillary sinusitis, unspecified: Secondary | ICD-10-CM | POA: Diagnosis not present

## 2018-10-11 MED ORDER — DOXYCYCLINE HYCLATE 100 MG PO TABS: 100.0000 mg | ORAL_TABLET | Freq: Two times a day (BID) | ORAL | 0 refills | Status: AC

## 2018-10-11 NOTE — Progress Notes (Signed)
Chief Complaint  Patient presents with  . Fever    May be due to dental work    John Farrell here for URI complaints. Due to COVID-19 pandemic, we are interacting via web portal for an electronic face-to-face visit. I verified patient's ID using 2 identifiers. Patient agreed to proceed with visit via this method. Patient is at home, I am at office. Patient and I are present for visit.   Duration: 9 days  Associated symptoms: Fever (100 F), sinus headache, sinus congestion, sinus pain, rhinorrhea and dental pain Denies: ear pain, ear drainage, sore throat, wheezing, shortness of breath, myalgia and cough or digestive s/s's Treatment to date: cefuroxime  Sick contacts: No  ROS:  Const: + fevers HEENT: As noted in HPI Lungs: No SOB  Past Medical History:  Diagnosis Date  . DEPRESSION, MILD   . GERD (gastroesophageal reflux disease)   . HIV infection ([place])   . Hyperlipidemia   . Hypertension   . HYPERTRIGLYCERIDEMIA   . Sinusitis   . SYPHILIS   . UNSPECIFIED NEURALGIA NEURITIS AND RADICULITIS    Exam No conversational dyspnea Age appropriate judgment and insight Nml affect and mood  Acute non-recurrent maxillary sinusitis - Plan: doxycycline (VIBRA-TABS) 100 MG tablet, stop cephalosporin, 5 more days. Let me know if there are further issues.   F/u prn.  Pt voiced understanding and agreement to the plan.  [place]ain [place], DO 10/11/18 3:25 PM

## 2018-12-17 ENCOUNTER — Other Ambulatory Visit: Payer: Self-pay

## 2018-12-18 ENCOUNTER — Other Ambulatory Visit: Payer: Self-pay

## 2018-12-18 ENCOUNTER — Ambulatory Visit: Payer: PRIVATE HEALTH INSURANCE | Admitting: Family Medicine

## 2018-12-18 ENCOUNTER — Encounter: Payer: Self-pay | Admitting: Family Medicine

## 2018-12-18 VITALS — BP 118/70 | HR 76 | Temp 97.1°F | Ht 71.0 in | Wt 179.4 lb

## 2018-12-18 DIAGNOSIS — H938X1 Other specified disorders of right ear: Secondary | ICD-10-CM

## 2018-12-18 MED ORDER — PREDNISONE 20 MG PO TABS: 40.0000 mg | ORAL_TABLET | Freq: Every day | ORAL | 0 refills | Status: AC

## 2018-12-18 NOTE — Patient Instructions (Signed)
I recommend getting the flu shot in mid October. This suggestion would change if the CDC comes out with a different recommendation.   OK to use Debrox (peroxide) in the ear to loosen up wax. Also recommend using a bulb syringe (for removing boogers from baby's noses) to flush through warm water. Do not use Q-tips as this can impact wax further.  Let us know if you need anything.

## 2018-12-18 NOTE — Progress Notes (Signed)
Chief Complaint  Patient presents with  . Ear Fullness    right ear    Pt is here for right ear fullness. Duration: 2 weeks Progression: unchanged Associated symptoms: Muffled/clogged ear sensation, sinus pressure on R, some congestion Denies: sore throat, fevers, pain, bleeding, or discharge from ear Treatment to date: INCS, Netty pot, swimmer's ear drops  ROS:  HEENT: +ear fullness Costitutional: Denies fevers  Past Medical History:  Diagnosis Date  . DEPRESSION, MILD   . GERD (gastroesophageal reflux disease)   . HIV infection ([place])   . Hyperlipidemia   . Hypertension   . HYPERTRIGLYCERIDEMIA   . Sinusitis   . SYPHILIS   . UNSPECIFIED NEURALGIA NEURITIS AND RADICULITIS     BP 118/70 (BP Location: Left Arm, Patient Position: Sitting, Cuff Size: Normal)   Pulse 76   Temp (!) 97.1 F (36.2 C) (Temporal)   Ht 5\' 11"  (1.803 m)   Wt 179 lb 6 oz (81.4 kg)   SpO2 97%   BMI 25.02 kg/m  General: Awake, alert, appearing stated age HEENT:  L ear- Canal patent without drainage or erythema, TM is neg R ear- canal largely patent w exception of piece of cerumen; after flush patent without drainage or erythema, TM is neg Nose- nares patent and without discharge Mouth- Lips, gums and dentition unremarkable, pharynx is without erythema or exudate Neck: No adenopathy Lungs: Normal effort, no accessory muscle use Psych: Age appropriate judgment and insight, normal mood and affect  Ear fullness, right - Plan: predniSONE (DELTASONE) 20 MG tablet  Felt a little better after cerumen removal, will try prednisone burst to see if he can open up the eustachian tube.  If no improvement over the next 2 weeks, will refer to ENT. F/u prn. Pt voiced understanding and agreement to the plan.  [place], DO 12/18/18 8:11 AM

## 2019-01-01 ENCOUNTER — Other Ambulatory Visit: Payer: Self-pay | Admitting: Infectious Diseases

## 2019-01-01 DIAGNOSIS — I10 Essential (primary) hypertension: Secondary | ICD-10-CM

## 2019-01-06 ENCOUNTER — Other Ambulatory Visit: Payer: Self-pay

## 2019-01-06 DIAGNOSIS — I10 Essential (primary) hypertension: Secondary | ICD-10-CM

## 2019-01-06 MED ORDER — LISINOPRIL 5 MG PO TABS: 5.0000 mg | ORAL_TABLET | Freq: Every day | ORAL | 1 refills | Status: DC

## 2019-01-09 ENCOUNTER — Other Ambulatory Visit: Payer: Self-pay

## 2019-01-09 ENCOUNTER — Ambulatory Visit (INDEPENDENT_AMBULATORY_CARE_PROVIDER_SITE_OTHER): Payer: PRIVATE HEALTH INSURANCE | Admitting: *Deleted

## 2019-01-09 DIAGNOSIS — Z23 Encounter for immunization: Secondary | ICD-10-CM

## 2019-01-09 NOTE — Progress Notes (Signed)
Patient here for flu vaccine.  Vaccine given in left deltoid and patient tolerated well. 

## 2019-02-13 ENCOUNTER — Other Ambulatory Visit: Payer: Self-pay | Admitting: Family Medicine

## 2019-04-03 DIAGNOSIS — B20 Human immunodeficiency virus [HIV] disease: Secondary | ICD-10-CM

## 2019-04-03 MED ORDER — BICTEGRAVIR-EMTRICITAB-TENOFOV 50-200-25 MG PO TABS: 1.0000 | ORAL_TABLET | Freq: Every day | ORAL | 11 refills | Status: DC

## 2019-05-12 ENCOUNTER — Other Ambulatory Visit: Payer: Self-pay | Admitting: Family Medicine

## 2019-06-18 NOTE — Addendum Note (Signed)
Addended by: Mariea Clonts D on: 06/18/2019 09:47 AM   Modules accepted: Orders

## 2019-06-20 ENCOUNTER — Other Ambulatory Visit (HOSPITAL_COMMUNITY)
Admission: RE | Admit: 2019-06-20 | Discharge: 2019-06-20 | Disposition: A | Discharge status: Home / Self Care | Payer: PRIVATE HEALTH INSURANCE | Source: Ambulatory Visit | Attending: Infectious Diseases | Admitting: Infectious Diseases

## 2019-06-20 ENCOUNTER — Other Ambulatory Visit: Payer: Self-pay

## 2019-06-20 ENCOUNTER — Other Ambulatory Visit: Payer: PRIVATE HEALTH INSURANCE

## 2019-06-20 DIAGNOSIS — Z113 Encounter for screening for infections with a predominantly sexual mode of transmission: Secondary | ICD-10-CM

## 2019-06-20 DIAGNOSIS — Z79899 Other long term (current) drug therapy: Secondary | ICD-10-CM

## 2019-06-20 DIAGNOSIS — B20 Human immunodeficiency virus [HIV] disease: Secondary | ICD-10-CM

## 2019-06-20 LAB — T-HELPER CELL (CD4) - (RCID CLINIC ONLY)
CD4 % Helper T Cell: 18 % — ABNORMAL LOW (ref 33–65)
CD4 T Cell Abs: 420 /uL (ref 400–1790)

## 2019-06-23 LAB — URINE CYTOLOGY ANCILLARY ONLY
Chlamydia: NEGATIVE
Comment: NEGATIVE
Comment: NORMAL
Neisseria Gonorrhea: NEGATIVE

## 2019-06-24 ENCOUNTER — Other Ambulatory Visit: Payer: Self-pay | Admitting: Infectious Diseases

## 2019-06-24 DIAGNOSIS — I10 Essential (primary) hypertension: Secondary | ICD-10-CM

## 2019-06-24 LAB — COMPREHENSIVE METABOLIC PANEL
AG Ratio: 1.7 (calc) (ref 1.0–2.5)
ALT: 27 U/L (ref 9–46)
AST: 32 U/L (ref 10–40)
Albumin: 4.4 g/dL (ref 3.6–5.1)
Alkaline phosphatase (APISO): 38 U/L (ref 36–130)
BUN: 16 mg/dL (ref 7–25)
CO2: 28 mmol/L (ref 20–32)
Calcium: 9.9 mg/dL (ref 8.6–10.3)
Chloride: 102 mmol/L (ref 98–110)
Creat: 1.04 mg/dL (ref 0.60–1.35)
Globulin: 2.6 g/dL (calc) (ref 1.9–3.7)
Glucose, Bld: 103 mg/dL — ABNORMAL HIGH (ref 65–99)
Potassium: 4.7 mmol/L (ref 3.5–5.3)
Sodium: 138 mmol/L (ref 135–146)
Total Bilirubin: 0.5 mg/dL (ref 0.2–1.2)
Total Protein: 7 g/dL (ref 6.1–8.1)

## 2019-06-24 LAB — CBC
HCT: 44.1 % (ref 38.5–50.0)
Hemoglobin: 14.7 g/dL (ref 13.2–17.1)
MCH: 29.1 pg (ref 27.0–33.0)
MCHC: 33.3 g/dL (ref 32.0–36.0)
MCV: 87.2 fL (ref 80.0–100.0)
MPV: 8.7 fL (ref 7.5–12.5)
Platelets: 263 Thousand/uL (ref 140–400)
RBC: 5.06 Million/uL (ref 4.20–5.80)
RDW: 13.6 % (ref 11.0–15.0)
WBC: 5.2 Thousand/uL (ref 3.8–10.8)

## 2019-06-24 LAB — LIPID PANEL
Cholesterol: 200 mg/dL — ABNORMAL HIGH
HDL: 38 mg/dL — ABNORMAL LOW
LDL Cholesterol (Calc): 137 mg/dL — ABNORMAL HIGH
Non-HDL Cholesterol (Calc): 162 mg/dL — ABNORMAL HIGH
Total CHOL/HDL Ratio: 5.3 (calc) — ABNORMAL HIGH
Triglycerides: 126 mg/dL

## 2019-06-24 LAB — RPR: RPR Ser Ql: NONREACTIVE

## 2019-06-24 LAB — HIV-1 RNA QUANT-NO REFLEX-BLD
HIV 1 RNA Quant: <20 {copies}/mL
HIV-1 RNA Quant, Log: <1.3 {Log_copies}/mL

## 2019-06-25 ENCOUNTER — Other Ambulatory Visit: Payer: Self-pay

## 2019-06-25 DIAGNOSIS — I10 Essential (primary) hypertension: Secondary | ICD-10-CM

## 2019-06-25 MED ORDER — LISINOPRIL 5 MG PO TABS: 5.0000 mg | ORAL_TABLET | Freq: Every day | ORAL | 0 refills | Status: DC

## 2019-07-04 ENCOUNTER — Encounter: Payer: PRIVATE HEALTH INSURANCE | Admitting: Infectious Diseases

## 2019-07-08 ENCOUNTER — Ambulatory Visit: Payer: PRIVATE HEALTH INSURANCE | Admitting: Infectious Diseases

## 2019-07-08 ENCOUNTER — Other Ambulatory Visit: Payer: Self-pay

## 2019-07-08 ENCOUNTER — Encounter: Payer: Self-pay | Admitting: Infectious Diseases

## 2019-07-08 VITALS — BP 155/86 | HR 74 | Temp 98.0°F | Ht 71.0 in | Wt 180.0 lb

## 2019-07-08 DIAGNOSIS — B20 Human immunodeficiency virus [HIV] disease: Secondary | ICD-10-CM | POA: Diagnosis not present

## 2019-07-08 DIAGNOSIS — Z113 Encounter for screening for infections with a predominantly sexual mode of transmission: Secondary | ICD-10-CM | POA: Diagnosis not present

## 2019-07-08 DIAGNOSIS — Z9049 Acquired absence of other specified parts of digestive tract: Secondary | ICD-10-CM

## 2019-07-08 DIAGNOSIS — E781 Pure hyperglyceridemia: Secondary | ICD-10-CM

## 2019-07-08 DIAGNOSIS — I10 Essential (primary) hypertension: Secondary | ICD-10-CM

## 2019-07-08 MED ORDER — FENOFIBRATE 145 MG PO TABS: 145.0000 mg | ORAL_TABLET | Freq: Every day | ORAL | 4 refills | Status: DC

## 2019-07-08 MED ORDER — LISINOPRIL 5 MG PO TABS: 5.0000 mg | ORAL_TABLET | Freq: Every day | ORAL | 4 refills | Status: DC

## 2019-07-08 MED ORDER — BICTEGRAVIR-EMTRICITAB-TENOFOV 50-200-25 MG PO TABS: 1.0000 | ORAL_TABLET | Freq: Every day | ORAL | 4 refills | Status: DC

## 2019-07-08 NOTE — Assessment & Plan Note (Signed)
Query HIV cholangiopathy

## 2019-07-08 NOTE — Assessment & Plan Note (Addendum)
Doing well, asx He will f/u with PCP Encouraged exercise (aerobic) and wt loss.  Decrease caffeine

## 2019-07-08 NOTE — Progress Notes (Signed)
   Subjective:    Patient ID: John Farrell, male    DOB: 08/02/1970, 49 y.o.   MRN: [id number]  HPI  49yo M with hx HIV+ 1999, mild hyperlipidemia, HTN, depression.  Married HIV- partner on prep- family is in [place], stressed as he cannot he cannot go visit.  When seen 04-2018 his ART was changed from [place] to biktarvy.  Has been feeling "good for the most part". Has been working from home.  Has some worry about his lipids as well as BP. Has been asx- no headache , no CP, no SOB.  Has been running on treadmill. Exercising daily.  COVID vaccine completed.   HIV 1 RNA Quant (copies/mL)  Date Value  06/20/2019 <20 NOT DETECTED  08/29/2018 <20 DETECTED (A)  05/02/2018 <20 NOT DETECTED   CD4 T Cell Abs (/uL)  Date Value  06/20/2019 420  08/29/2018 455  05/02/2018 380 (L)    Review of Systems  Constitutional: Negative for appetite change and unexpected weight change.  Respiratory: Negative for shortness of breath.   Cardiovascular: Negative for chest pain.  Gastrointestinal: Negative for diarrhea and nausea.  Genitourinary: Negative for difficulty urinating.  Neurological: Negative for headaches.       Objective:   Physical Exam Constitutional:      General: He is not in acute distress.    Appearance: Normal appearance. He is not ill-appearing.  HENT:     Mouth/Throat:     Mouth: Mucous membranes are moist.     Pharynx: No oropharyngeal exudate.  Eyes:     Extraocular Movements: Extraocular movements intact.     Pupils: Pupils are equal, round, and reactive to light.  Cardiovascular:     Rate and Rhythm: Normal rate and regular rhythm.  Pulmonary:     Effort: Pulmonary effort is normal.     Breath sounds: Normal breath sounds.  Abdominal:     General: Bowel sounds are normal. There is no distension.     Palpations: Abdomen is soft.     Tenderness: There is no abdominal tenderness.  Musculoskeletal:     Cervical back: Normal range of motion.     Right lower  leg: No edema.     Left lower leg: No edema.  Neurological:     General: No focal deficit present.     Mental Status: He is alert.  Psychiatric:        Mood and Affect: Mood normal.           Assessment & Plan:

## 2019-07-08 NOTE — Assessment & Plan Note (Signed)
LFTs normal cholesterol borderline.  Continue on fenofibrate.

## 2019-07-08 NOTE — Assessment & Plan Note (Addendum)
He is doing very well.  PCP will set up colon. He has had prior.  Married.  vax up to date rtc in 1 yr

## 2019-10-02 ENCOUNTER — Ambulatory Visit: Payer: Self-pay | Admitting: Registered Nurse

## 2019-10-02 ENCOUNTER — Other Ambulatory Visit: Payer: Self-pay

## 2019-10-02 ENCOUNTER — Encounter: Payer: Self-pay | Admitting: Registered Nurse

## 2019-10-02 VITALS — BP 157/83 | HR 64 | Temp 97.8°F | Resp 16

## 2019-10-02 DIAGNOSIS — J00 Acute nasopharyngitis [common cold]: Secondary | ICD-10-CM

## 2019-10-02 DIAGNOSIS — J029 Acute pharyngitis, unspecified: Secondary | ICD-10-CM

## 2019-10-02 MED ORDER — PHENYLEPHRINE HCL 5 MG PO TABS: 5.0000 mg | ORAL_TABLET | Freq: Four times a day (QID) | ORAL | Status: AC | PRN

## 2019-10-02 MED ORDER — AMOXICILLIN-POT CLAVULANATE 875-125 MG PO TABS: 1.0000 | ORAL_TABLET | Freq: Two times a day (BID) | ORAL | 0 refills | Status: AC

## 2019-10-02 MED ORDER — SALINE SPRAY 0.65 % NA SOLN: 2.0000 | NASAL | 0 refills | Status: DC

## 2019-10-02 NOTE — Progress Notes (Signed)
Subjective:    Patient ID: John Farrell, male    DOB: 05/29/1970, 49 y.o.   MRN: [id number]  48y/o Caucasian established male pt c/o sore throat x3 days. Also noticed lymph node in R neck swollen the next days. Bitter taste in mouth x2 days. Irritated sensation in throat, not painful. Feels better when eating or drinking.   Doesn't feel like his usual pharyngitis or sinusitis last episode 2019 and typically fever/low grade with strep.  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  Did recently travel in [place].  No known contact with sick individuals.  Seeing PCM on regular basis due to HIV.  Hasn't been using flonase or nasal saline as no sinus issues currently.      Review of Systems  Constitutional: Negative for activity change, appetite change, chills, diaphoresis, fatigue, fever and unexpected weight change.  HENT: Positive for sore throat. Negative for congestion, dental problem, drooling, ear discharge, facial swelling, hearing loss, mouth sores, nosebleeds, postnasal drip, rhinorrhea, sinus pressure, sinus pain, sneezing, tinnitus, trouble swallowing and voice change.   Eyes: Negative for photophobia, pain, discharge, redness and visual disturbance.  Respiratory: Negative for cough, choking, shortness of breath, wheezing and stridor.   Cardiovascular: Negative for chest pain.  Gastrointestinal: Negative for abdominal pain, diarrhea, nausea and vomiting.  Endocrine: Negative for cold intolerance and heat intolerance.  Genitourinary: Negative for difficulty urinating.  Musculoskeletal: Negative for arthralgias, back pain, gait problem, joint swelling, myalgias, neck pain and neck stiffness.  Skin: Negative for rash.  Allergic/Immunologic: Positive for environmental allergies. Negative for food allergies.  Neurological:  Negative for dizziness, tremors, seizures, syncope, facial asymmetry, speech difficulty, weakness, light-headedness, numbness and headaches.  Hematological: Negative for adenopathy. Does not bruise/bleed easily.  Psychiatric/Behavioral: Negative for agitation, confusion and sleep disturbance.       Objective:   Physical Exam Vitals and nursing note reviewed.  Constitutional:      General: He is awake. He is not in acute distress.    Appearance: Normal appearance. He is well-developed, well-groomed and normal weight. He is not ill-appearing, toxic-appearing or diaphoretic.  HENT:     Head: Normocephalic and atraumatic. No raccoon eyes, Battle's sign, abrasion, contusion, masses, right periorbital erythema, left periorbital erythema or laceration. Hair is normal.     Jaw: There is normal jaw occlusion. No trismus, tenderness, swelling, pain on movement or malocclusion.     Salivary Glands: Right salivary gland is not diffusely enlarged or tender. Left salivary gland is not diffusely enlarged or tender.     Right Ear: Hearing, ear canal and external ear normal. A middle ear effusion is present.     Left Ear: Hearing, ear canal and external ear normal. A middle ear effusion is present.     Nose: Mucosal edema present. No nasal deformity, septal deviation, signs of injury, laceration, nasal tenderness, congestion or rhinorrhea.     Right Turbinates: Not enlarged, swollen or pale.     Left Turbinates: Not enlarged, swollen or pale.     Right Sinus: No maxillary sinus tenderness or frontal sinus tenderness.     Left Sinus: No maxillary sinus tenderness or frontal sinus tenderness.     Mouth/Throat:     Lips: Pink. No lesions.     Mouth: Mucous membranes are moist. Mucous membranes are not pale, not dry and  not cyanotic. No injury, lacerations, oral lesions or angioedema.     Dentition: No dental tenderness, gingival swelling, dental caries, dental abscesses or gum lesions.     Tongue: No lesions.  Tongue does not deviate from midline.     Palate: No mass and lesions.     Pharynx: Uvula midline. Pharyngeal swelling and posterior oropharyngeal erythema present. No oropharyngeal exudate or uvula swelling.     Tonsils: No tonsillar exudate or tonsillar abscesses. 0 on the right. 0 on the left.     Comments: Cobblestoning posterior pharynx; bilateral TMs air fluid level clear; bilateral allergic shiners Eyes:     General: Lids are normal. Vision grossly intact. Gaze aligned appropriately. Allergic shiner present. No visual field deficit or scleral icterus.       Right eye: No foreign body, discharge or hordeolum.        Left eye: No foreign body, discharge or hordeolum.     Extraocular Movements: Extraocular movements intact.     Right eye: Normal extraocular motion and no nystagmus.     Left eye: Normal extraocular motion and no nystagmus.     Conjunctiva/sclera: Conjunctivae normal.     Right eye: Right conjunctiva is not injected. No chemosis, exudate or hemorrhage.    Left eye: Left conjunctiva is not injected. No chemosis, exudate or hemorrhage.    Pupils: Pupils are equal, round, and reactive to light. Pupils are equal.     Right eye: Pupil is round and reactive.     Left eye: Pupil is round and reactive.  Neck:     Thyroid: No thyroid mass, thyromegaly or thyroid tenderness.     Trachea: Trachea and phonation normal. No tracheal tenderness or tracheal deviation.  Cardiovascular:     Rate and Rhythm: Normal rate and regular rhythm.     Chest Wall: PMI is not displaced.     Pulses: Normal pulses.          Radial pulses are 2+ on the right side and 2+ on the left side.     Heart sounds: Normal heart sounds, S1 normal and S2 normal. No murmur heard.  No friction rub. No gallop.   Pulmonary:     Effort: Pulmonary effort is normal. No respiratory distress.     Breath sounds: Normal breath sounds and air entry. No stridor, decreased air movement or transmitted upper airway sounds. No  decreased breath sounds, wheezing, rhonchi or rales.     Comments: No cough observed in clinic; wore disposable mask due to covid pandemic removed for ENT exam and reapplied immediately after; spoke full sentences without difficulty Abdominal:     General: Abdomen is flat. There is no distension.     Palpations: Abdomen is soft.  Musculoskeletal:        General: No tenderness. Normal range of motion.     Right shoulder: Normal.     Left shoulder: Normal.     Right elbow: Normal.     Left elbow: Normal.     Right hand: Normal.     Left hand: Normal.     Cervical back: Normal, normal range of motion and neck supple. No swelling, edema, deformity, erythema, signs of trauma, lacerations, rigidity, spasms, torticollis, tenderness or crepitus. No pain with movement or muscular tenderness. Normal range of motion.     Thoracic back: Normal.     Lumbar back: Normal.     Right hip: Normal.     Left hip: Normal.  Right knee: Normal.     Left knee: Normal.  Lymphadenopathy:     Head:     Right side of head: No submental, submandibular, tonsillar, preauricular, posterior auricular or occipital adenopathy.     Left side of head: No submental, submandibular, tonsillar, preauricular, posterior auricular or occipital adenopathy.     Cervical: No cervical adenopathy.     Right cervical: No superficial, deep or posterior cervical adenopathy.    Left cervical: No superficial, deep or posterior cervical adenopathy.  Skin:    General: Skin is warm and dry.     Capillary Refill: Capillary refill takes less than 2 seconds.     Coloration: Skin is not ashen, cyanotic, jaundiced, mottled, pale or sallow.     Findings: No abrasion, abscess, acne, bruising, burn, ecchymosis, erythema, signs of injury, laceration, lesion, petechiae, rash or wound.     Nails: There is no clubbing.  Neurological:     General: No focal deficit present.     Mental Status: He is alert and oriented to person, place, and time.  Mental status is at baseline.     GCS: GCS eye subscore is 4. GCS verbal subscore is 5. GCS motor subscore is 6.     Cranial Nerves: Cranial nerves are intact. No cranial nerve deficit, dysarthria or facial asymmetry.     Sensory: Sensation is intact. No sensory deficit.     Motor: Motor function is intact. No weakness, tremor, atrophy, abnormal muscle tone or seizure activity.     Coordination: Coordination is intact. Coordination normal.     Gait: Gait is intact. Gait normal.     Comments: Gait sure and steady in hallway; on/off exam table and in/out of chair without difficulty; bilateral hand grasp equal 5/5  Psychiatric:        Attention and Perception: Attention and perception normal.        Mood and Affect: Mood and affect normal.        Speech: Speech normal.        Behavior: Behavior normal. Behavior is cooperative.        Thought Content: Thought content normal.        Cognition and Memory: Cognition and memory normal.        Judgment: Judgment normal.           Assessment & Plan:  A-acute pharyngitis and rhinitis initial visit  P-Patient may use normal saline nasal spray 2 sprays each nostril q2h wa as needed given 1 bottle from clinic stock.  Restart flonase 1 spray each nostril BID OTC.  Patient denied personal or family history of ENT cancer. Trial phenylephrine 5-10mg  po q6h prn rhinitis given 4 UD from clinic stock.  Discussed may cause drowsiness start with 5mg  and if not enough relief post nasal drip increase to 10mg  and caution with driving/operating machinery until known if drowsiness occurs for him.  Consider wearing cloth/disposable mask at work to see if symptoms improve.  Avoid triggers if possible.  Shower prior to bedtime if exposed to triggers.  If allergic dust/dust mites recommend mattress/pillow covers/encasements; washing linens, vacuuming, sweeping, dusting weekly.  Call or return to clinic as needed if these symptoms worsen or fail to improve as  anticipated.   Exitcare handout on allergic rhinitis, nonallergic rhinitis and sinus rinse printed and given to patient.  Patient verbalized understanding of instructions, agreed with plan of care and had no further questions at this time.  P2:  Avoidance and hand washing.  Discussed with patient I think this is post nasal drip causing sore throat.  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  Discussed there has been noted increase in community viral illness/allergy symptoms since CDC announced if fully vaccinated no longer need to wear mask in community and employer changed mask policy once employee showed proof of full vaccination. Usually no specific medical treatment is needed if a virus is causing the sore throat. The throat most often gets better on its own within 5 to 7 days. Antibiotic medicine does not cure viral pharyngitis. Due to tomorrow last day clinic open this week until Monday given Rx augmentin 875mg  po BID x 10 days #20 RF0 from [facility] to start if fever, exudate noted throat or typical sinusitis/pharyngitis symptoms start. For acute pharyngitis caused by bacteria, your healthcare provider will prescribe an antibiotic.  Do not smoke.  Marland Kitchen Avoid secondhand smoke and other air pollutants.  . Use a cool mist humidifier to add moisture to the air.  . Get plenty of rest.  . You may want to rest your throat by talking less and eating a diet that is mostly liquid or soft for a day or two.  Marland Kitchen Nonprescription throat lozenges and mouthwashes should help relieve the soreness.  . Gargling with warm saltwater and drinking warm liquids may help. (You can make a saltwater solution by adding 1/4 teaspoon of salt to 8 ounces, or 240 mL, of warm water.)  Honey is natural soother can help with post nasal drip irritation/edema trial 1 tablespoon  every 4 hours prn sore throat.   Exitcare handout on pharyngitis printed and given to patient. FOLLOW UP with clinic provider if no improvements in the next 7-10 days. Patient verbalized understanding of instructions and agreed with plan of care and had no further questions at this time.  P2: Hand washing and diet.

## 2019-10-02 NOTE — Patient Instructions (Signed)
Pharyngitis  Pharyngitis is redness, pain, and swelling (inflammation) of the throat (pharynx). It is a very common cause of sore throat. Pharyngitis can be caused by a bacteria, but it is usually caused by a virus. Most cases of pharyngitis get better on their own without treatment. What are the causes? This condition may be caused by:  Infection by viruses (viral). Viral pharyngitis spreads from person to person (is contagious) through coughing, sneezing, and sharing of personal items or utensils such as cups, forks, spoons, and toothbrushes.  Infection by bacteria (bacterial). Bacterial pharyngitis may be spread by touching the nose or face after coming in contact with the bacteria, or through more intimate contact, such as kissing.  Allergies. Allergies can cause buildup of mucus in the throat (post-nasal drip), leading to inflammation and irritation. Allergies can also cause blocked nasal passages, forcing breathing through the mouth, which dries and irritates the throat. What increases the risk? You are more likely to develop this condition if:  You are 5-24 years old.  You are exposed to crowded environments such as daycare, school, or dormitory living.  You live in a cold climate.  You have a weakened disease-fighting (immune) system. What are the signs or symptoms? Symptoms of this condition vary by the cause (viral, bacterial, or allergies) and can include:  Sore throat.  Fatigue.  Low-grade fever.  Headache.  Joint pain and muscle aches.  Skin rashes.  Swollen glands in the throat (lymph nodes).  Plaque-like film on the throat or tonsils. This is often a symptom of bacterial pharyngitis.  Vomiting.  Stuffy nose (nasal congestion).  Cough.  Red, itchy eyes (conjunctivitis).  Loss of appetite. How is this diagnosed? This condition is often diagnosed based on your medical history and a physical exam. Your health care provider will ask you questions about your  illness and your symptoms. A swab of your throat may be done to check for bacteria (rapid strep test). Other lab tests may also be done, depending on the suspected cause, but these are rare. How is this treated? This condition usually gets better in 3-4 days without medicine. Bacterial pharyngitis may be treated with antibiotic medicines. Follow these instructions at home:  Take over-the-counter and prescription medicines only as told by your health care provider. ? If you were prescribed an antibiotic medicine, take it as told by your health care provider. Do not stop taking the antibiotic even if you start to feel better. ? Do not give children aspirin because of the association with Reye syndrome.  Drink enough water and fluids to keep your urine clear or pale yellow.  Get a lot of rest.  Gargle with a salt-water mixture 3-4 times a day or as needed. To make a salt-water mixture, completely dissolve -1 tsp of salt in 1 cup of warm water.  If your health care provider approves, you may use throat lozenges or sprays to soothe your throat. Contact a health care provider if:  You have large, tender lumps in your neck.  You have a rash.  You cough up green, yellow-brown, or bloody spit. Get help right away if:  Your neck becomes stiff.  You drool or are unable to swallow liquids.  You cannot drink or take medicines without vomiting.  You have severe pain that does not go away, even after you take medicine.  You have trouble breathing, and it is not caused by a stuffy nose.  You have new pain and swelling in your joints such as   the knees, ankles, wrists, or elbows. Summary  Pharyngitis is redness, pain, and swelling (inflammation) of the throat (pharynx).  While pharyngitis can be caused by a bacteria, the most common causes are viral.  Most cases of pharyngitis get better on their own without treatment.  Bacterial pharyngitis is treated with antibiotic medicines. This  information is not intended to replace advice given to you by your health care provider. Make sure you discuss any questions you have with your health care provider. Document Revised: 02/23/2017 Document Reviewed: 04/18/2016 Elsevier Patient Education  2020 [employer]. How to Perform a Sinus Rinse A sinus rinse is a home treatment that is used to rinse your sinuses with a sterile mixture of salt and water (saline solution). Sinuses are air-filled spaces in your skull behind the bones of your face and forehead that open into your nasal cavity. A sinus rinse can help to clear mucus, dirt, dust, or pollen from your nasal cavity. You may do a sinus rinse when you have a cold, a virus, nasal allergy symptoms, a sinus infection, or stuffiness in your nose or sinuses. Talk with your health care provider about whether a sinus rinse might help you. What are the risks? A sinus rinse is generally safe and effective. However, there are a few risks, which include:  A burning sensation in your sinuses. This may happen if you do not make the saline solution as directed. Be sure to follow all directions when making the saline solution.  Nasal irritation.  Infection from contaminated water. This is rare, but possible. Do not do a sinus rinse if you have had ear or nasal surgery, ear infection, or blocked ears. Supplies needed:  Saline solution or powder.  Distilled or sterile water may be needed to mix with saline powder. ? You may use boiled and cooled tap water. Boil tap water for 5 minutes; cool until it is lukewarm. Use within 24 hours. ? Do not use regular tap water to mix with the saline solution.  Neti pot or nasal rinse bottle. These supplies release the saline solution into your nose and through your sinuses. Neti pots and nasal rinse bottles can be purchased at [employer], a health food store, or online. How to perform a sinus rinse  1. Wash your hands with soap and water. 2. Wash  your device according to the directions that came with the product and then dry it. 3. Use the solution that comes with your product or one that is sold separately in stores. Follow the mixing directions on the package if you need to mix with sterile or distilled water. 4. Fill the device with the amount of saline solution noted in the device instructions. 5. Stand over a sink and tilt your head sideways over the sink. 6. Place the spout of the device in your upper nostril (the one closer to the ceiling). 7. Gently pour or squeeze the saline solution into your nasal cavity. The liquid should drain out from the lower nostril if you are not too congested. 8. While rinsing, breathe through your open mouth. 9. Gently blow your nose to clear any mucus and rinse solution. Blowing too hard may cause ear pain. 10. Repeat in your other nostril. 11. Clean and rinse your device with clean water and then air-dry it. Talk with your health care provider or pharmacist if you have questions about how to do a sinus rinse. Summary  A sinus rinse is a home treatment that is used to  rinse your sinuses with a sterile mixture of salt and water (saline solution).  A sinus rinse is generally safe and effective. Follow all instructions carefully.  Before doing a sinus rinse, talk with your health care provider about whether it would be helpful for you. This information is not intended to replace advice given to you by your health care provider. Make sure you discuss any questions you have with your health care provider. Document Revised: 01/08/2017 Document Reviewed: 01/08/2017 Elsevier Patient Education  2020 [employer] Allergic Rhinitis, Adult Allergic rhinitis is an allergic reaction that affects the mucous membrane inside the nose. It causes sneezing, a runny or stuffy nose, and the feeling of mucus going down the back of the throat (postnasal drip). Allergic rhinitis can be mild to severe. There are two types  of allergic rhinitis:  Seasonal. This type is also called hay fever. It happens only during certain seasons.  Perennial. This type can happen at any time of the year. What are the causes? This condition happens when the body's defense system (immune system) responds to certain harmless substances called allergens as though they were germs.  Seasonal allergic rhinitis is triggered by pollen, which can come from grasses, trees, and weeds. Perennial allergic rhinitis may be caused by:  House dust mites.  Pet dander.  Mold spores. What are the signs or symptoms? Symptoms of this condition include:  Sneezing.  Runny or stuffy nose (nasal congestion).  Postnasal drip.  Itchy nose.  Tearing of the eyes.  Trouble sleeping.  Daytime sleepiness. How is this diagnosed? This condition may be diagnosed based on:  Your medical history.  A physical exam.  Tests to check for related conditions, such as: ? Asthma. ? Pink eye. ? Ear infection. ? Upper respiratory infection.  Tests to find out which allergens trigger your symptoms. These may include skin or blood tests. How is this treated? There is no cure for this condition, but treatment can help control symptoms. Treatment may include:  Taking medicines that block allergy symptoms, such as antihistamines. Medicine may be given as a shot, nasal spray, or pill.  Avoiding the allergen.  Desensitization. This treatment involves getting ongoing shots until your body becomes less sensitive to the allergen. This treatment may be done if other treatments do not help.  If taking medicine and avoiding the allergen does not work, new, stronger medicines may be prescribed. Follow these instructions at home:  Find out what you are allergic to. Common allergens include smoke, dust, and pollen.  Avoid the things you are allergic to. These are some things you can do to help avoid allergens: ? Replace carpet with wood, tile, or vinyl  flooring. Carpet can trap dander and dust. ? Do not smoke. Do not allow smoking in your home. ? Change your heating and air conditioning filter at least once a month. ? During allergy season:  Keep windows closed as much as possible.  Plan outdoor activities when pollen counts are lowest. This is usually during the evening hours.  When coming indoors, change clothing and shower before sitting on furniture or bedding.  Take over-the-counter and prescription medicines only as told by your health care provider.  Keep all follow-up visits as told by your health care provider. This is important. Contact a health care provider if:  You have a fever.  You develop a persistent cough.  You make whistling sounds when you breathe (you wheeze).  Your symptoms interfere with your normal daily activities. Get help  right away if:  You have shortness of breath. Summary  This condition can be managed by taking medicines as directed and avoiding allergens.  Contact your health care provider if you develop a persistent cough or fever.  During allergy season, keep windows closed as much as possible. This information is not intended to replace advice given to you by your health care provider. Make sure you discuss any questions you have with your health care provider. Document Revised: 02/23/2017 Document Reviewed: 04/20/2016 Elsevier Patient Education  2020 [employer] Inc. Nonallergic Rhinitis Nonallergic rhinitis is a condition that causes symptoms that affect the nose, such as a runny nose and a stuffed-up nose (nasal congestion) that can make it hard to breathe through the nose. This condition is different from having an allergy (allergic rhinitis). Allergic rhinitis occurs when the body's defense system (immune system) reacts to a substance that you are allergic to (allergen), such as pollen, pet dander, mold, or dust. Nonallergic rhinitis has many similar symptoms, but it is not caused by  allergens. Nonallergic rhinitis can be a short-term or long-term problem. What are the causes? This condition can be caused by many different things. Some common types of nonallergic rhinitis include: Infectious rhinitis  This is usually due to an infection in the upper respiratory tract. Vasomotor rhinitis  This is the most common type of long-term nonallergic rhinitis.  It is caused by too much blood flow through the nose, which makes the tissue inside of the nose swell.  Symptoms are often triggered by strong odors, cold air, stress, drinking alcohol, cigarette smoke, or changes in the weather. Occupational rhinitis  This type is caused by triggers in the workplace, such as chemicals, dusts, animal dander, or air pollution. Hormonal rhinitis  This type occurs in women as a result of an increase in the male hormone estrogen.  It may occur during pregnancy, puberty, and menstrual cycles.  Symptoms improve when estrogen levels drop. Drug-induced rhinitis Several drugs can cause nonallergic rhinitis, including:  Medicines that are used to treat high blood pressure, heart disease, and Parkinson disease.  Aspirin and NSAIDs.  Over-the-counter nasal decongestant sprays. These can cause a type of nonallergic rhinitis (rhinitis medicamentosa) when they are used for more than a few days. Nonallergic rhinitis with eosinophilia syndrome (NARES)  This type is caused by having too much of a certain type of white blood cell (eosinophil). Nonallergic rhinitis can also be caused by a reaction to eating hot or spicy foods. This does not usually cause long-term symptoms. In some cases, the cause of nonallergic rhinitis is not known. What increases the risk? You are more likely to develop this condition if:  You are 58-69 years of age.  You are a woman. Women are twice as likely to have this condition. What are the signs or symptoms? Common symptoms of this condition include:  Nasal  congestion.  Runny nose.  The feeling of mucus going down the back of the throat (postnasal drip).  Trouble sleeping at night and daytime sleepiness. Less common symptoms include:  Sneezing.  Coughing.  Itchy nose.  Bloodshot eyes. How is this diagnosed? This condition may be diagnosed based on:  Your symptoms and medical history.  A physical exam.  Allergy testing to rule out allergic rhinitis. You may have skin tests or blood tests. In some cases, the health care provider may take a swab of nasal secretions to look for an increased number of eosinophils. This would be done to confirm a diagnosis of  NARES. How is this treated? Treatment for this condition depends on the cause. No single treatment works for everyone. Work with your health care provider to find the best treatment for you. Treatment may include:  Avoiding the things that trigger your symptoms.  Using medicines to relieve congestion, such as: ? Steroid nasal spray. There are many types. You may need to try a few to find out which one works best. ? Decongestant medicine. This may be an oral medicine or a nasal spray. These medicines are only used for a short time.  Using medicines to relieve a runny nose. These may include antihistamine medicines or anticholinergic nasal sprays.  Surgery to remove tissue from inside the nose may be needed in severe cases if the condition has not improved after 6-12 months of medical treatment. Follow these instructions at home:  Take or use over-the-counter and prescription medicines only as told by your health care provider. Do not stop using your medicine even if you start to feel better.  Use salt-water (saline) rinses or other solutions (nasal washes or irrigations) to wash or rinse out the inside of your nose as told by your health care provider.  Do not take NSAIDs or medicines that contain aspirin if they make your symptoms worse.  Do not drink alcohol if it makes your  symptoms worse.  Do not use any tobacco products, such as cigarettes, chewing tobacco, and e-cigarettes. If you need help quitting, ask your health care provider.  Avoid secondhand smoke.  Get some exercise every day. Exercise may help reduce symptoms of nonallergic rhinitis for some people. Ask your health care provider how much exercise and what types of exercise are safe for you.  Sleep with the head of your bed raised (elevated). This may reduce nighttime nasal congestion.  Keep all follow-up visits as told by your health care provider. This is important. Contact a health care provider if:  You have a fever.  Your symptoms are getting worse at home.  Your symptoms are not responding to medicine.  You develop new symptoms, especially a headache or nosebleed. This information is not intended to replace advice given to you by your health care provider. Make sure you discuss any questions you have with your health care provider. Document Revised: 02/23/2017 Document Reviewed: 06/03/2015 Elsevier Patient Education  2020 [employer].

## 2019-10-22 ENCOUNTER — Other Ambulatory Visit: Payer: Self-pay

## 2019-10-22 ENCOUNTER — Ambulatory Visit: Payer: PRIVATE HEALTH INSURANCE | Admitting: Family Medicine

## 2019-10-22 ENCOUNTER — Encounter: Payer: Self-pay | Admitting: Family Medicine

## 2019-10-22 VITALS — BP 122/72 | HR 78 | Temp 99.0°F | Ht 71.0 in | Wt 174.5 lb

## 2019-10-22 DIAGNOSIS — H6982 Other specified disorders of Eustachian tube, left ear: Secondary | ICD-10-CM | POA: Diagnosis not present

## 2019-10-22 DIAGNOSIS — J302 Other seasonal allergic rhinitis: Secondary | ICD-10-CM

## 2019-10-22 DIAGNOSIS — H6992 Unspecified Eustachian tube disorder, left ear: Secondary | ICD-10-CM

## 2019-10-22 MED ORDER — PREDNISONE 20 MG PO TABS: 40.0000 mg | ORAL_TABLET | Freq: Every day | ORAL | 0 refills | Status: AC

## 2019-10-22 MED ORDER — LEVOCETIRIZINE DIHYDROCHLORIDE 5 MG PO TABS: 5.0000 mg | ORAL_TABLET | Freq: Every evening | ORAL | 2 refills | Status: DC

## 2019-10-22 NOTE — Progress Notes (Signed)
Chief Complaint  Patient presents with  . Follow-up    ear ringing/fullness  . Sore Throat    Pt is here for left ear pain. Duration: 14 days Progression: better Associated symptoms: pressure, loss of hearing, ringing in ear Denies: productive cough, bleeding, or discharge from ear Treatment to date: Debrox  ST off and on for 1.5 weeks. Varies which side it affects. Thinks it is allergies. No sick contacts, fevers, cough, wheezing, sob, ear drainage. Has not taken anything thus far other than INCS.   Past Medical History:  Diagnosis Date  . DEPRESSION, MILD   . GERD (gastroesophageal reflux disease)   . HIV infection (HCC)   . Hyperlipidemia   . Hypertension   . HYPERTRIGLYCERIDEMIA   . Sinusitis   . SYPHILIS   . UNSPECIFIED NEURALGIA NEURITIS AND RADICULITIS     BP 122/72 (BP Location: Left Arm, Patient Position: Sitting, Cuff Size: Normal)   Pulse 78   Temp 99 F (37.2 C) (Oral)   Ht 5\' 11"  (1.803 m)   Wt 174 lb 8 oz (79.2 kg)   SpO2 96%   BMI 24.34 kg/m  General: Awake, alert, appearing stated age HEENT:  L ear- Canal patent without drainage or erythema, TM is neg R ear- canal patent without drainage or erythema, TM is neg Nose- nares patent and without discharge Mouth- Lips, gums and dentition unremarkable, pharynx is without erythema or exudate Neck: No adenopathy Lungs: Normal effort, no accessory muscle use Psych: Age appropriate judgment and insight, normal mood and affect  Dysfunction of left eustachian tube - Plan: predniSONE (DELTASONE) 20 MG tablet  Seasonal allergies - Plan: levocetirizine (XYZAL) 5 MG tablet  Might not need pred. Start Xyzal. Cont INCS.  F/u prn.  Pt voiced understanding and agreement to the plan.  [place], DO 10/22/19 8:29 AM

## 2019-10-22 NOTE — Patient Instructions (Signed)
Claritin (loratadine), Allegra (fexofenadine), Zyrtec (cetirizine) which is also equivalent to Xyzal (levocetirizine); these are listed in order from weakest to strongest. Generic, and therefore cheaper, options are in the parentheses.   Flonase (fluticasone); nasal spray that is over the counter. 2 sprays each nostril, once daily. Aim towards the same side eye when you spray.  There are available OTC, and the generic versions, which may be cheaper, are in parentheses. Show this to a pharmacist if you have trouble finding any of these items.  Hold off on the prednisone for now. If the ear starts getting worse, go ahead and take it.  Let us know if you need anything.

## 2019-12-24 ENCOUNTER — Telehealth: Payer: Self-pay | Admitting: Registered Nurse

## 2019-12-24 ENCOUNTER — Encounter: Payer: Self-pay | Admitting: Registered Nurse

## 2019-12-24 DIAGNOSIS — U071 COVID-19: Secondary | ICD-10-CM

## 2019-12-24 NOTE — Patient Instructions (Signed)
10 Things You Can Do to Manage Your COVID-19 Symptoms at Home °If you have possible or confirmed COVID-19: °1. Stay home from work and school. And stay away from other public places. If you must go out, avoid using any kind of public transportation, ridesharing, or taxis. °2. Monitor your symptoms carefully. If your symptoms get worse, call your healthcare provider immediately. °3. Get rest and stay hydrated. °4. If you have a medical appointment, call the healthcare provider ahead of time and tell them that you have or may have COVID-19. °5. For medical emergencies, call 911 and notify the dispatch personnel that you have or may have COVID-19. °6. Cover your cough and sneezes with a tissue or use the inside of your elbow. °7. Wash your hands often with soap and water for at least 20 seconds or clean your hands with an alcohol-based hand sanitizer that contains at least 60% alcohol. °8. As much as possible, stay in a specific room and away from other people in your home. Also, you should use a separate bathroom, if available. If you need to be around other people in or outside of the home, wear a mask. °9. Avoid sharing personal items with other people in your household, like dishes, towels, and bedding. °10. Clean all surfaces that are touched often, like counters, tabletops, and doorknobs. Use household cleaning sprays or wipes according to the label instructions. °cdc.gov/coronavirus °09/25/2018 °This information is not intended to replace advice given to you by your health care provider. Make sure you discuss any questions you have with your health care provider. °Document Revised: 02/27/2019 Document Reviewed: 02/27/2019 °Elsevier Patient Education © 2020 [employer] Inc[employer] ° ° °COVID-19 °COVID-19 is a respiratory infection that is caused by a virus called severe acute respiratory syndrome coronavirus 2 (SARS-CoV-2). The disease is also known as coronavirus disease or novel coronavirus. In some people, the virus may  not cause any symptoms. In others, it may cause a serious infection. The infection can get worse quickly and can lead to complications, such as: °· Pneumonia, or infection of the lungs. °· Acute respiratory distress syndrome or ARDS. This is a condition in which fluid build-up in the lungs prevents the lungs from filling with air and passing oxygen into the blood. °· Acute respiratory failure. This is a condition in which there is not enough oxygen passing from the lungs to the body or when carbon dioxide is not passing from the lungs out of the body. °· Sepsis or septic shock. This is a serious bodily reaction to an infection. °· Blood clotting problems. °· Secondary infections due to bacteria or fungus. °· Organ failure. This is when your body's organs stop working. °The virus that causes COVID-19 is contagious. This means that it can spread from person to person through droplets from coughs and sneezes (respiratory secretions). °What are the causes? °This illness is caused by a virus. You may catch the virus by: °· Breathing in droplets from an infected person. Droplets can be spread by a person breathing, speaking, singing, coughing, or sneezing. °· Touching something, like a table or a doorknob, that was exposed to the virus (contaminated) and then touching your mouth, nose, or eyes. °What increases the risk? °Risk for infection °You are more likely to be infected with this virus if you: °· Are within 6 feet (2 meters) of a person with COVID-19. °· Provide care for or live with a person who is infected with COVID-19. °· Spend time in crowded indoor spaces or   live in shared housing. °Risk for serious illness °You are more likely to become seriously ill from the virus if you: °· Are 50 years of age or older. The higher your age, the more you are at risk for serious illness. °· Live in a nursing home or long-term care facility. °· Have cancer. °· Have a long-term (chronic) disease such as: °? Chronic lung disease,  including chronic obstructive pulmonary disease or asthma. °? A long-term disease that lowers your body's ability to fight infection (immunocompromised). °? Heart disease, including heart failure, a condition in which the arteries that lead to the heart become narrow or blocked (coronary artery disease), a disease which makes the heart muscle thick, weak, or stiff (cardiomyopathy). °? Diabetes. °? Chronic kidney disease. °? Sickle cell disease, a condition in which red blood cells have an abnormal "sickle" shape. °? Liver disease. °· Are obese. °What are the signs or symptoms? °Symptoms of this condition can range from mild to severe. Symptoms may appear any time from 2 to 14 days after being exposed to the virus. They include: °· A fever or chills. °· A cough. °· Difficulty breathing. °· Headaches, body aches, or muscle aches. °· Runny or stuffy (congested) nose. °· A sore throat. °· Kattner loss of taste or smell. °Some people may also have stomach problems, such as nausea, vomiting, or diarrhea. °Other people may not have any symptoms of COVID-19. °How is this diagnosed? °This condition may be diagnosed based on: °· Your signs and symptoms, especially if: °? You live in an area with a COVID-19 outbreak. °? You recently traveled to or from an area where the virus is common. °? You provide care for or live with a person who was diagnosed with COVID-19. °? You were exposed to a person who was diagnosed with COVID-19. °· A physical exam. °· Lab tests, which may include: °? Taking a sample of fluid from the back of your nose and throat (nasopharyngeal fluid), your nose, or your throat using a swab. °? A sample of mucus from your lungs (sputum). °? Blood tests. °· Imaging tests, which may include, X-rays, CT scan, or ultrasound. °How is this treated? °At present, there is no medicine to treat COVID-19. Medicines that treat other diseases are being used on a trial basis to see if they are effective against COVID-19. °Your  health care provider will talk with you about ways to treat your symptoms. For most people, the infection is mild and can be managed at home with rest, fluids, and over-the-counter medicines. °Treatment for a serious infection usually takes places in a hospital intensive care unit (ICU). It may include one or more of the following treatments. These treatments are given until your symptoms improve. °· Receiving fluids and medicines through an IV. °· Supplemental oxygen. Extra oxygen is given through a tube in the nose, a face mask, or a hood. °· Positioning you to lie on your stomach (prone position). This makes it easier for oxygen to get into the lungs. °· Continuous positive airway pressure (CPAP) or bi-level positive airway pressure (BPAP) machine. This treatment uses mild air pressure to keep the airways open. A tube that is connected to a motor delivers oxygen to the body. °· Ventilator. This treatment moves air into and out of the lungs by using a tube that is placed in your windpipe. °· Tracheostomy. This is a procedure to create a hole in the neck so that a breathing tube can be inserted. °· Extracorporeal membrane   oxygenation (ECMO). This procedure gives the lungs a chance to recover by taking over the functions of the heart and lungs. It supplies oxygen to the body and removes carbon dioxide. °Follow these instructions at home: °Lifestyle °· If you are sick, stay home except to get medical care. Your health care provider will tell you how long to stay home. Call your health care provider before you go for medical care. °· Rest at home as told by your health care provider. °· Do not use any products that contain nicotine or tobacco, such as cigarettes, e-cigarettes, and chewing tobacco. If you need help quitting, ask your health care provider. °· Return to your normal activities as told by your health care provider. Ask your health care provider what activities are safe for you. °General  instructions °· Take over-the-counter and prescription medicines only as told by your health care provider. °· Drink enough fluid to keep your urine pale yellow. °· Keep all follow-up visits as told by your health care provider. This is important. °How is this prevented? ° °There is no vaccine to help prevent COVID-19 infection. However, there are steps you can take to protect yourself and others from this virus. °To protect yourself:  °· Do not travel to areas where COVID-19 is a risk. The areas where COVID-19 is reported change often. To identify high-risk areas and travel restrictions, check the CDC travel website: wwwnc.cdc.gov/travel/notices °· If you live in, or must travel to, an area where COVID-19 is a risk, take precautions to avoid infection. °? Stay away from people who are sick. °? Wash your hands often with soap and water for 20 seconds. If soap and water are not available, use an alcohol-based hand sanitizer. °? Avoid touching your mouth, face, eyes, or nose. °? Avoid going out in public, follow guidance from your state and local health authorities. °? If you must go out in public, wear a cloth face covering or face mask. Make sure your mask covers your nose and mouth. °? Avoid crowded indoor spaces. Stay at least 6 feet (2 meters) away from others. °? Disinfect objects and surfaces that are frequently touched every day. This may include: °§ Counters and tables. °§ Doorknobs and light switches. °§ Sinks and faucets. °§ Electronics, such as phones, remote controls, keyboards, computers, and tablets. °To protect others: °If you have symptoms of COVID-19, take steps to prevent the virus from spreading to others. °· If you think you have a COVID-19 infection, contact your health care provider right away. Tell your health care team that you think you may have a COVID-19 infection. °· Stay home. Leave your house only to seek medical care. Do not use public transport. °· Do not travel while you are  sick. °· Wash your hands often with soap and water for 20 seconds. If soap and water are not available, use alcohol-based hand sanitizer. °· Stay away from other members of your household. Let healthy household members care for children and pets, if possible. If you have to care for children or pets, wash your hands often and wear a mask. If possible, stay in your own room, separate from others. Use a different bathroom. °· Make sure that all people in your household wash their hands well and often. °· Cough or sneeze into a tissue or your sleeve or elbow. Do not cough or sneeze into your hand or into the air. °· Wear a cloth face covering or face mask. Make sure your mask covers your nose   mouth. Where to find more information  Centers for Disease Control and Prevention: StickerEmporium.tn  World Health Organization: https://thompson-craig.com/ Contact a health care provider if:  You live in or have traveled to an area where COVID-19 is a risk and you have symptoms of the infection.  You have had contact with someone who has COVID-19 and you have symptoms of the infection. Get help right away if:  You have trouble breathing.  You have pain or pressure in your chest.  You have confusion.  You have bluish lips and fingernails.  You have difficulty waking from sleep.  You have symptoms that get worse. These symptoms may represent a serious problem that is an emergency. Do not wait to see if the symptoms will go away. Get medical help right away. Call your local emergency services (911 in the [place] Do not drive yourself to the hospital. Let the emergency medical personnel know if you think you have COVID-19. Summary  COVID-19 is a respiratory infection that is caused by a virus. It is also known as coronavirus disease or novel coronavirus. It can cause serious infections, such as pneumonia, acute respiratory distress syndrome, acute respiratory failure,  or sepsis.  The virus that causes COVID-19 is contagious. This means that it can spread from person to person through droplets from breathing, speaking, singing, coughing, or sneezing.  You are more likely to develop a serious illness if you are 47 years of age or older, have a weak immune system, live in a nursing home, or have chronic disease.  There is no medicine to treat COVID-19. Your health care provider will talk with you about ways to treat your symptoms.  Take steps to protect yourself and others from infection. Wash your hands often and disinfect objects and surfaces that are frequently touched every day. Stay away from people who are sick and wear a mask if you are sick. This information is not intended to replace advice given to you by your health care provider. Make sure you discuss any questions you have with your health care provider. Document Revised: 01/10/2019 Document Reviewed: 04/18/2018 Elsevier Patient Education  2020 Elsevier Inc.    Person Under Monitoring Name: John Farrell [facility]  Location: [address] [place] [address]   Infection Prevention Recommendations for Individuals Confirmed to have, or Being Evaluated for, 2019 Novel Coronavirus (COVID-19) Infection Who Receive Care at Home  Individuals who are confirmed to have, or are being evaluated for, COVID-19 should follow the prevention steps below until a healthcare provider or local or state health department says they can return to normal activities.  Stay home except to get medical care You should restrict activities outside your home, except for getting medical care. Do not go to work, school, or public areas, and do not use public transportation or taxis.  Call ahead before visiting your doctor Before your medical appointment, call the healthcare provider and tell them that you have, or are being evaluated for, COVID-19 infection. This will help the healthcare provider's office take steps  to keep other people from getting infected. Ask your healthcare provider to call the local or state health department.  Monitor your symptoms Seek prompt medical attention if your illness is worsening (e.g., difficulty breathing). Before going to your medical appointment, call the healthcare provider and tell them that you have, or are being evaluated for, COVID-19 infection. Ask your healthcare provider to call the local or state health department.  Wear a facemask You should wear  a facemask that covers your nose and mouth when you are in the same room with other people and when you visit a healthcare provider. People who live with or visit you should also wear a facemask while they are in the same room with you.  Separate yourself from other people in your home As much as possible, you should stay in a different room from other people in your home. Also, you should use a separate bathroom, if available.  Avoid sharing household items You should not share dishes, drinking glasses, cups, eating utensils, towels, bedding, or other items with other people in your home. After using these items, you should wash them thoroughly with soap and water.  Cover your coughs and sneezes Cover your mouth and nose with a tissue when you cough or sneeze, or you can cough or sneeze into your sleeve. Throw used tissues in a lined trash can, and immediately wash your hands with soap and water for at least 20 seconds or use an alcohol-based hand rub.  Wash your [employer] your hands often and thoroughly with soap and water for at least 20 seconds. You can use an alcohol-based hand sanitizer if soap and water are not available and if your hands are not visibly dirty. Avoid touching your eyes, nose, and mouth with unwashed hands.   Prevention Steps for Caregivers and Household Members of Individuals Confirmed to have, or Being Evaluated for, COVID-19 Infection Being Cared for in the Home  If you live  with, or provide care at home for, a person confirmed to have, or being evaluated for, COVID-19 infection please follow these guidelines to prevent infection:  Follow healthcare provider's instructions Make sure that you understand and can help the patient follow any healthcare provider instructions for all care.  Provide for the patient's basic needs You should help the patient with basic needs in the home and provide support for getting groceries, prescriptions, and other personal needs.  Monitor the patient's symptoms If they are getting sicker, call his or her medical provider and tell them that the patient has, or is being evaluated for, COVID-19 infection. This will help the healthcare provider's office take steps to keep other people from getting infected. Ask the healthcare provider to call the local or state health department.  Limit the number of people who have contact with the patient  If possible, have only one caregiver for the patient.  Other household members should stay in another home or place of residence. If this is not possible, they should stay  in another room, or be separated from the patient as much as possible. Use a separate bathroom, if available.  Restrict visitors who do not have an essential need to be in the home.  Keep older adults, very young children, and other sick people away from the patient Keep older adults, very young children, and those who have compromised immune systems or chronic health conditions away from the patient. This includes people with chronic heart, lung, or kidney conditions, diabetes, and cancer.  Ensure good ventilation Make sure that shared spaces in the home have good air flow, such as from an air conditioner or an opened window, weather permitting.  Wash your hands often  Wash your hands often and thoroughly with soap and water for at least 20 seconds. You can use an alcohol based hand sanitizer if soap and water are not  available and if your hands are not visibly dirty.  Avoid touching your eyes, nose, and  mouth with unwashed hands.  Use disposable paper towels to dry your hands. If not available, use dedicated cloth towels and replace them when they become wet.  Wear a facemask and gloves  Wear a disposable facemask at all times in the room and gloves when you touch or have contact with the patient's blood, body fluids, and/or secretions or excretions, such as sweat, saliva, sputum, nasal mucus, vomit, urine, or feces.  Ensure the mask fits over your nose and mouth tightly, and do not touch it during use.  Throw out disposable facemasks and gloves after using them. Do not reuse.  Wash your hands immediately after removing your facemask and gloves.  If your personal clothing becomes contaminated, carefully remove clothing and launder. Wash your hands after handling contaminated clothing.  Place all used disposable facemasks, gloves, and other waste in a lined container before disposing them with other household waste.  Remove gloves and wash your hands immediately after handling these items.  Do not share dishes, glasses, or other household items with the patient  Avoid sharing household items. You should not share dishes, drinking glasses, cups, eating utensils, towels, bedding, or other items with a patient who is confirmed to have, or being evaluated for, COVID-19 infection.  After the person uses these items, you should wash them thoroughly with soap and water.  Wash laundry thoroughly  Immediately remove and wash clothes or bedding that have blood, body fluids, and/or secretions or excretions, such as sweat, saliva, sputum, nasal mucus, vomit, urine, or feces, on them.  Wear gloves when handling laundry from the patient.  Read and follow directions on labels of laundry or clothing items and detergent. In general, wash and dry with the warmest temperatures recommended on the label.  Clean all  areas the individual has used often  Clean all touchable surfaces, such as counters, tabletops, doorknobs, bathroom fixtures, toilets, phones, keyboards, tablets, and bedside tables, every day. Also, clean any surfaces that may have blood, body fluids, and/or secretions or excretions on them.  Wear gloves when cleaning surfaces the patient has come in contact with.  Use a diluted bleach solution (e.g., dilute bleach with 1 part bleach and 10 parts water) or a household disinfectant with a label that says EPA-registered for coronaviruses. To make a bleach solution at home, add 1 tablespoon of bleach to 1 quart (4 cups) of water. For a larger supply, add  cup of bleach to 1 gallon (16 cups) of water.  Read labels of cleaning products and follow recommendations provided on product labels. Labels contain instructions for safe and effective use of the cleaning product including precautions you should take when applying the product, such as wearing gloves or eye protection and making sure you have good ventilation during use of the product.  Remove gloves and wash hands immediately after cleaning.  Monitor yourself for signs and symptoms of illness Caregivers and household members are considered close contacts, should monitor their health, and will be asked to limit movement outside of the home to the extent possible. Follow the monitoring steps for close contacts listed on the symptom monitoring form.   ? If you have additional questions, contact your local health department or call the epidemiologist on call at [phone] (available 24/7). ? This guidance is subject to change. For the most up-to-date guidance from [facility], please refer to their website: [email or web address]

## 2019-12-24 NOTE — Telephone Encounter (Signed)
HR manager Salley Slaughter notified NP that pt reported symptoms started on afternoon 12/23/2019 chest burning afternoon at work. And then woke up 0200 9/29 feeling hot and headache. Pt began quarantine at that time. Patient did develop sx of cough, chest pain, HA, feeling hot, sinus pressure, nasal congestion. Temp 98.7 today and usually 97.2-.5   Pt previously advised by HR to follow 10 day symptomatic with exposure person quarantine per CDC recommendations. Day 1 of quarantine was 12/24/2019.  Binaxnow test performed by patient today at home positive.   If sx improving and no fever in previous >24hrs without antipyretics, pt to complete quarantine 02 Jan 2020 and return to work 03 Jan 2020.     Reviewed possible Covid sx including cough, shortness of breath with exertion or at rest, runny nose, congestion, sinus pain/pressure, sore throat, fever/chills, body aches, fatigue, loss of taste/smell, GI symptoms of nausea/vomiting/diarrhea. Also reviewed same day/emergent eval/ER precautions of dizziness/syncope, confusion, blue tint to lips/face, severe shortness of breath/difficulty breathing/wheezing.   Discussed monoclonal antibodies with patient.  DIscussed high risk for severe covid based on age, male, hypertension, hypertriglyceridemia and HIV disease.  Patient agreed to proceed with adding name to monoclonal infusion list at [facility] and email sent to infusion clinic secure with patient information.  Discussed purpose and possible side effects effects most common with patient.  Patient to isolate in own room and if possible use only one bathroom if living with others in home.  Wear mask when out of room to help prevent spread to others in household.  Discussed with patient home treatment may take tylenol up to 1000mg  po q6h prn fever/pain and fluticasone 1 spray each nostril  BID OTC, Patient may use normal saline nasal spray 2 sprays each nostril q2h wa as needed Patient denied personal or family  history of ENT cancer.  OTC phenylephrine 5-10mg  po q6h prn rhinitis  Shower am/prior to bedtime if congested.  Hydrate, eat regular meals, avoid dehydrations.  Discussed with patient not unexpected for him to lose taste/smell or worsening cough/sinus symptoms/body aches.  Call  clinic as needed if these symptoms worsen or fail to improve as anticipated or email [email or web address] or [email or web address].  Discussed clinic closed Wed, Sat, Sun and typically I call patients on quarantine on those days and RN Mon calls patients when clinic open.   Exitcare handouts on covid 19, FAQ, homecare and isolation home guidelines to patient my chart.  Audio call only today and duration 23 minutes.   Pt verbalized understanding and agreement with plan of care. No further questions/concerns at this time. Pt reminded to contact clinic with any changes in sx or questions/concerns.   HR Tim notified patient on quarantine until 02 Jan 2020.

## 2019-12-25 ENCOUNTER — Other Ambulatory Visit: Payer: Self-pay | Admitting: Internal Medicine

## 2019-12-25 ENCOUNTER — Ambulatory Visit (HOSPITAL_COMMUNITY)
Admission: RE | Admit: 2019-12-25 | Discharge: 2019-12-25 | Disposition: A | Discharge status: Home / Self Care | Payer: PRIVATE HEALTH INSURANCE | Source: Ambulatory Visit | Attending: Pulmonary Disease | Admitting: Pulmonary Disease

## 2019-12-25 DIAGNOSIS — B20 Human immunodeficiency virus [HIV] disease: Secondary | ICD-10-CM

## 2019-12-25 DIAGNOSIS — I10 Essential (primary) hypertension: Secondary | ICD-10-CM

## 2019-12-25 DIAGNOSIS — U071 COVID-19: Secondary | ICD-10-CM | POA: Diagnosis not present

## 2019-12-25 DIAGNOSIS — Z21 Asymptomatic human immunodeficiency virus [HIV] infection status: Secondary | ICD-10-CM

## 2019-12-25 MED ORDER — ALBUTEROL SULFATE HFA 108 (90 BASE) MCG/ACT IN AERS: 2.0000 | INHALATION_SPRAY | Freq: Once | RESPIRATORY_TRACT | Status: DC | PRN

## 2019-12-25 MED ORDER — SODIUM CHLORIDE 0.9 % IV SOLN
1200.0000 mg | Freq: Once | INTRAVENOUS | Status: AC
  Administered 2019-12-25: 1200 mg via INTRAVENOUS

## 2019-12-25 MED ORDER — FAMOTIDINE IN NACL 20-0.9 MG/50ML-% IV SOLN: 20.0000 mg | Freq: Once | INTRAVENOUS | Status: DC | PRN

## 2019-12-25 MED ORDER — SODIUM CHLORIDE 0.9 % IV SOLN: INTRAVENOUS | Status: DC | PRN

## 2019-12-25 MED ORDER — DIPHENHYDRAMINE HCL 50 MG/ML IJ SOLN: 50.0000 mg | Freq: Once | INTRAMUSCULAR | Status: DC | PRN

## 2019-12-25 MED ORDER — METHYLPREDNISOLONE SODIUM SUCC 125 MG IJ SOLR: 125.0000 mg | Freq: Once | INTRAMUSCULAR | Status: DC | PRN

## 2019-12-25 MED ORDER — EPINEPHRINE 0.3 MG/0.3ML IJ SOAJ: 0.3000 mg | Freq: Once | INTRAMUSCULAR | Status: DC | PRN

## 2019-12-25 NOTE — Discharge Instructions (Signed)

## 2019-12-25 NOTE — Telephone Encounter (Signed)
Noted and agreed patient sent me email notifying me he was scheduled for monoclonal antibody infusion today at [facility].

## 2019-12-25 NOTE — Progress Notes (Signed)
  Diagnosis: COVID-19  Physician: Patrick Wright, MD  Procedure: Covid Infusion Clinic Med: casirivimab\imdevimab infusion - Provided patient with casirivimab\imdevimab fact sheet for patients, parents and caregivers prior to infusion.  Complications: No immediate complications noted.  Discharge: Discharged home   Annalis Kaczmarczyk N Michae Grimley 12/25/2019   

## 2019-12-25 NOTE — Progress Notes (Signed)
I connected by phone with John Farrell on 12/25/2019 at 10:17 AM to discuss the potential use of a new treatment for mild to moderate COVID-19 viral infection in non-hospitalized patients.  This patient is a 49 y.o. male that meets the FDA criteria for Emergency Use Authorization of COVID monoclonal antibody casirivimab/imdevimab or bamlanivimab/eteseviamb.  Has a (+) direct SARS-CoV-2 viral test result  Has mild or moderate COVID-19   Is NOT hospitalized due to COVID-19  Is within 10 days of symptom onset  Has at least one of the high risk factor(s) for progression to severe COVID-19 and/or hospitalization as defined in EUA.  Specific high risk criteria : Immunosuppressive Disease or Treatment   I have spoken and communicated the following to the patient or parent/caregiver regarding COVID monoclonal antibody treatment:  1. FDA has authorized the emergency use for the treatment of mild to moderate COVID-19 in adults and pediatric patients with positive results of direct SARS-CoV-2 viral testing who are 49 years of age and older weighing at least 40 kg, and who are at high risk for progressing to severe COVID-19 and/or hospitalization.  2. The significant known and potential risks and benefits of COVID monoclonal antibody, and the extent to which such potential risks and benefits are unknown.  3. Information on available alternative treatments and the risks and benefits of those alternatives, including clinical trials.  4. Patients treated with COVID monoclonal antibody should continue to self-isolate and use infection control measures (e.g., wear mask, isolate, social distance, avoid sharing personal items, clean and disinfect "high touch" surfaces, and frequent handwashing) according to CDC guidelines.   5. The patient or parent/caregiver has the option to accept or refuse COVID monoclonal antibody treatment.  After reviewing this information with the patient, the patient has  agreed to receive one of the available covid 19 monoclonal antibodies and will be provided an appropriate fact sheet prior to infusion.   Cyndee Brightly, NP [facility]

## 2019-12-25 NOTE — Telephone Encounter (Signed)
Per NP, pt replied to email and informed her that he is scheduled for monoclonal antibody infusion today at [facility] at 3:30.

## 2019-12-26 NOTE — Telephone Encounter (Signed)
Noted some symptoms will call patient this weekend.

## 2019-12-26 NOTE — Telephone Encounter (Signed)
Spoke with pt by phone. Completed antibody infusion yesterday without difficulty or side effects.   Lost sense of smell last night. Still has taste.   Reports that head congestion is his biggest complaint currently. Some chills. Possible fever developing. Cough is improving. Feels slightly improved overall from yesterday.   Denies any questions for RN today. Encouraged to use nasal saline, Flonase, Phenyleprhine. NP may check in over the weekend. Otherwise, RN will check back early next week. Pt agreeable to this.

## 2019-12-27 NOTE — Telephone Encounter (Signed)
Patient contacted via telephone.  Stated still running low grade fever 99-99.5.  Has taste of smell.  Hydrating and taking tylenol 1000mg  po QID prn.  Cough resolved.  Congestion improved nasal this am can breath through both nostrils this am.  Feeling better than earlier in the week.  But still no smell.  36.5C temperature oral during telephone call.  Duration of call 3.64minutes.  Patient spoke full sentences without difficulty, no cough audible, some throat clearing noted.  A&Ox3. Discussed most patients feeling better after monoclonal antibodies at this point in time post infusion (couple days) Will call again tomorrow probably afternoon/evening.  Patient verbalized understanding information/instructions, agreed with plan of care and had no further questions at this time.

## 2019-12-29 NOTE — Telephone Encounter (Signed)
Spoke with pt by phone. He reports only current sx are congestion, PND, and still no smell. No fever since Saturday, 2 days ago. Advised pt his quarantine dates remain the same, through 10/8, RTW 10/9, or next scheduled workday which is 10/11. Pt verbalizes understanding, agreement. No further questions or concerns.

## 2019-12-30 NOTE — Telephone Encounter (Signed)
Noted still congestion, post nasal drip and loss of smell continue.  Denied fever in previous 48 hours.  Continue home quarantine.

## 2019-12-31 NOTE — Telephone Encounter (Signed)
Telephone message left for patient checking in to see if any questions or concerns . Reminded him clinic closed today (normal closed Wed/Sat/Sun) but can reach me at [email or web address]  or RN Rolly Salter and I back in clinic tomorrow [phone]

## 2020-01-02 NOTE — Telephone Encounter (Signed)
Patient contacted via telephone stated feeling well congestion/runny nose and loss of smell improved/resolving.  Can smell cleaning wipes today.  Feeling well and does not have any questions or concerns.  No cough during 2 minute call audio noted.  A&Ox3 respirations even and unlabored spoke full sentences without difficulty.

## 2020-01-04 NOTE — Telephone Encounter (Signed)
Telephone message left for patient voicemail home number.  Checking in to see if he worked shift yesterday and how it went/any change in symptoms.  Notified him RN Rolly Salter would check in with him tomorrow as she is in clinic.

## 2020-01-05 NOTE — Telephone Encounter (Signed)
Pt RTW today as expected. Returned loaned pulse ox to clinic. RN did not see pt as he dropped off while RN was with another pt.

## 2020-01-05 NOTE — Telephone Encounter (Signed)
Please verify patient returned to work as expected today

## 2020-01-05 NOTE — Telephone Encounter (Signed)
Noted returned to work as expected today.

## 2020-01-27 ENCOUNTER — Other Ambulatory Visit: Payer: Self-pay | Admitting: Infectious Diseases

## 2020-01-27 DIAGNOSIS — B20 Human immunodeficiency virus [HIV] disease: Secondary | ICD-10-CM

## 2020-04-07 ENCOUNTER — Other Ambulatory Visit: Payer: Self-pay

## 2020-04-07 ENCOUNTER — Telehealth: Payer: Self-pay

## 2020-04-07 DIAGNOSIS — N521 Erectile dysfunction due to diseases classified elsewhere: Secondary | ICD-10-CM

## 2020-04-07 NOTE — Telephone Encounter (Signed)
Patient requesting refill for tadalafil. Routing to provider for approval. Valarie Cones

## 2020-04-08 MED ORDER — TADALAFIL 5 MG PO TABS: 5.0000 mg | ORAL_TABLET | Freq: Every day | ORAL | 0 refills | Status: DC | PRN

## 2020-04-08 NOTE — Telephone Encounter (Signed)
Ok to refill 

## 2020-04-08 NOTE — Telephone Encounter (Signed)
Verbal order per Dr. Ninetta Lights ok to refill. Valarie Cones

## 2020-04-15 ENCOUNTER — Encounter: Payer: Self-pay | Admitting: Registered Nurse

## 2020-04-15 ENCOUNTER — Other Ambulatory Visit: Payer: Self-pay

## 2020-04-15 ENCOUNTER — Ambulatory Visit: Payer: Self-pay | Admitting: Registered Nurse

## 2020-04-15 VITALS — BP 140/79 | HR 70 | Temp 97.9°F

## 2020-04-15 DIAGNOSIS — B309 Viral conjunctivitis, unspecified: Secondary | ICD-10-CM

## 2020-04-15 DIAGNOSIS — J019 Acute sinusitis, unspecified: Secondary | ICD-10-CM

## 2020-04-15 MED ORDER — KETOTIFEN FUMARATE 0.025 % OP SOLN: 1.0000 [drp] | Freq: Two times a day (BID) | OPHTHALMIC | 0 refills | Status: AC

## 2020-04-15 MED ORDER — ERYTHROMYCIN 5 MG/GM OP OINT: 1.0000 "application " | TOPICAL_OINTMENT | Freq: Four times a day (QID) | OPHTHALMIC | 0 refills | Status: AC

## 2020-04-15 MED ORDER — SALINE SPRAY 0.65 % NA SOLN: 2.0000 | NASAL | 0 refills | Status: DC

## 2020-04-15 MED ORDER — REFRESH PLUS 0.5 % OP SOLN: 1.0000 [drp] | Freq: Three times a day (TID) | OPHTHALMIC | 0 refills | Status: AC | PRN

## 2020-04-15 MED ORDER — LORATADINE 10 MG PO TABS: 10.0000 mg | ORAL_TABLET | Freq: Every day | ORAL | 11 refills | Status: DC | PRN

## 2020-04-15 NOTE — Progress Notes (Signed)
Subjective:    Patient ID: John Farrell, male    DOB: 10-May-1970, 50 y.o.   MRN: [id number]  49y/o Caucasian established male pt reporting one week history of matting to L eye upon waking in mornings. Improves throughout the day but still has some cloudy/blurred vision in the mornings due to drainage and intermittent, infrequent watery drainage through the day. Sometimes itchy but not very bothersome. Has been using artificial tear eye drops refresh TID prn with some temporary relief.  Stated started after returning home from [place], history seasonal allergies, denied known sick contacts and does not wear contact lenses does wear glasses  Vision normal with glasses at work.  Denied fever/chills/sinus infection/headache/n/v/d.  Has noticed some blood tinged mucous from nose in am right nostril.  Using nasal saline.     Review of Systems  Constitutional: Negative for activity change, appetite change, chills, diaphoresis, fatigue and fever.  HENT: Negative for congestion, sinus pressure, sinus pain, sneezing and sore throat.   Eyes: Positive for discharge, itching and visual disturbance. Negative for photophobia, pain and redness.  Respiratory: Negative for cough, shortness of breath, wheezing and stridor.   Gastrointestinal: Negative for diarrhea, nausea and vomiting.  Endocrine: Negative for cold intolerance and heat intolerance.  Genitourinary: Negative for difficulty urinating.  Musculoskeletal: Negative for gait problem, myalgias, neck pain and neck stiffness.  Skin: Negative for rash.  Allergic/Immunologic: Positive for environmental allergies. Negative for food allergies.  Neurological: Negative for dizziness, tremors, seizures, syncope, facial asymmetry, speech difficulty, light-headedness, numbness and headaches.  Hematological: Negative for adenopathy. Does not bruise/bleed easily.  Psychiatric/Behavioral: Negative for agitation, confusion and sleep disturbance.        Objective:   Physical Exam Vitals and nursing note reviewed.  Constitutional:      General: He is awake and active. He is not in acute distress.Vital signs are normal.     Appearance: Normal appearance. He is well-developed, well-groomed and well-nourished. He is not ill-appearing, toxic-appearing, sickly-appearing or diaphoretic.  HENT:     Head: Normocephalic and atraumatic. No raccoon eyes, abrasion, right periorbital erythema or left periorbital erythema.     Jaw: There is normal jaw occlusion. No trismus.     Right Ear: Hearing and external ear normal.     Left Ear: Hearing and external ear normal.     Nose: No nasal deformity, septal deviation, signs of injury, laceration, nasal tenderness, sinus tenderness, mucosal edema, congestion, rhinorrhea, nasal septal hematoma, epistaxis or foreign body.     Right Nostril: No foreign body or epistaxis.     Left Nostril: No foreign body or epistaxis.     Right Turbinates: Not enlarged, swollen or pale.     Left Turbinates: Not enlarged, swollen or pale.     Right Sinus: No maxillary sinus tenderness or frontal sinus tenderness.     Left Sinus: No maxillary sinus tenderness or frontal sinus tenderness.     Mouth/Throat:     Lips: Pink. No lesions.     Mouth: Mucous membranes are normal. Mucous membranes are moist. Mucous membranes are not pale, not dry and not cyanotic. No injury, lacerations, oral lesions or angioedema.     Dentition: No gingival swelling or gum lesions.     Tongue: No lesions. Tongue does not deviate from midline.     Palate: No mass and lesions.     Pharynx: Uvula midline. Pharyngeal swelling, posterior oropharyngeal edema and posterior oropharyngeal erythema present. No oropharyngeal exudate or uvula swelling.  Tonsils: No tonsillar exudate or tonsillar abscesses.     Comments: Cobblestoning posterior pharynx; bilateral allergic shiners; lower eyelids bilateral 1+/4 nonpitting edema; clear discharge bilateral nasal  turbinates Eyes:     General: Lids are normal. Vision grossly intact. Gaze aligned appropriately. No visual field deficit or scleral icterus.       Right eye: No foreign body, discharge or hordeolum.        Left eye: No foreign body, discharge or hordeolum.     Extraocular Movements: EOM normal.     Right eye: Normal extraocular motion and no nystagmus.     Left eye: Normal extraocular motion and no nystagmus.     Conjunctiva/sclera:     Right eye: Right conjunctiva is injected. No chemosis, exudate or hemorrhage.    Left eye: Left conjunctiva is injected. No chemosis, exudate or hemorrhage.    Pupils: Pupils are equal, round, and reactive to light. Pupils are equal.     Right eye: Pupil is round and reactive.     Left eye: Pupil is round and reactive.     Comments: Bilateral upper and lower eyelid conjunctiva 1+/4 injection; no debris in eyebrows or eyelashes noted; no purulent discharge noted bilateral eyes  Neck:     Trachea: Trachea and phonation normal. No tracheal tenderness or tracheal deviation.  Cardiovascular:     Rate and Rhythm: Normal rate and regular rhythm.     Pulses: Intact distal pulses.          Radial pulses are 2+ on the right side and 2+ on the left side.  Pulmonary:     Effort: Pulmonary effort is normal. No respiratory distress.     Breath sounds: Normal breath sounds and air entry. No stridor, decreased air movement or transmitted upper airway sounds. No decreased breath sounds, wheezing, rhonchi or rales.     Comments: Spoke full sentences without difficulty; wearing cloth mask due to covid 19 pandemic; no cough/nasal sniffing/congestion or throat clearing noted in exam room Abdominal:     General: There is no distension.     Palpations: Abdomen is soft.  Musculoskeletal:        General: No tenderness or edema. Normal range of motion.     Right shoulder: Normal.     Left shoulder: Normal.     Right elbow: Normal.     Left elbow: Normal.     Right hand:  Normal.     Left hand: Normal.     Cervical back: Normal, normal range of motion and neck supple. No swelling, edema, deformity, erythema, signs of trauma, lacerations, rigidity, torticollis, tenderness or crepitus. No pain with movement, spinous process tenderness or muscular tenderness. Normal range of motion.     Thoracic back: Normal.     Lumbar back: Normal.     Right hip: Normal.     Left hip: Normal.     Right knee: Normal.     Left knee: Normal.  Lymphadenopathy:     Head:     Right side of head: No submental, submandibular, tonsillar, preauricular, posterior auricular or occipital adenopathy.     Left side of head: No submental, submandibular, tonsillar, preauricular, posterior auricular or occipital adenopathy.     Cervical: No cervical adenopathy.     Right cervical: No superficial, deep or posterior cervical adenopathy.    Left cervical: No superficial, deep or posterior cervical adenopathy.  Skin:    General: Skin is warm, dry and intact.     Capillary  Refill: Capillary refill takes less than 2 seconds.     Coloration: Skin is not ashen, cyanotic, jaundiced, mottled, pale or sallow.     Findings: No abrasion, abscess, acne, bruising, burn, ecchymosis, erythema, signs of injury, laceration, lesion, petechiae, rash or wound.     Nails: There is no clubbing or cyanosis.  Neurological:     General: No focal deficit present.     Mental Status: He is alert and oriented to person, place, and time.     GCS: GCS eye subscore is 4. GCS verbal subscore is 5. GCS motor subscore is 6.     Cranial Nerves: Cranial nerves are intact. No cranial nerve deficit, dysarthria or facial asymmetry.     Sensory: Sensation is intact. No sensory deficit.     Motor: Motor function is intact. No weakness, tremor, atrophy, abnormal muscle tone or seizure activity.     Coordination: Coordination is intact. Coordination normal.     Gait: Gait is intact. Gait normal.     Comments: Gait sure and steady in  clinic; in/out of chair and on/off exam table without difficulty; bilateral hand grasp equal 5/5  Psychiatric:        Attention and Perception: Attention and perception normal.        Mood and Affect: Mood and affect, mood and affect normal.        Speech: Speech normal.        Behavior: Behavior normal. Behavior is cooperative.        Thought Content: Thought content normal.        Cognition and Memory: Cognition, memory and cognition and memory normal.        Judgment: Judgment normal.           Assessment & Plan:  A-conjunctivitis left acute viral, acute rhinosinusitis  P-Cleared for work Presenter, broadcasting discussed.Patient to apply warm or cool packs prn right eye 5 minutes TID prn or warm compress.  Refresh drops 2 bilateral eyes TID x 7 days 5 UD from clinic stock previously given by RN Rolly Salter.  May start ketotifen 0.025% 1 gtt BID ou OTC.  Erythromycin ointment ophthalmic 0.5% instill 1cm ribbon left eye BID x 7 days #1 RF0 dispensed from [facility] to patient.  Discussed do not touch container to eye.  Wash hands before and after installing eye drops/ointment. Instructed patient to not rub eyes. May need to wash pillowcases more frequently until infection resolves if discharge noted on pillow. May use over the counter eye drops/tears such as visine per manufacturer instructions for pain/symptom relief. Return to clinic if headache, fever greater than 100.48F, nausea/vomiting, purulent discharge/matting unable to open eye without using fingers after 24 hours of medication use, foreign body sensation, ciliary flush, worsening photophobia or vision. Discussed to see optometrist same day if visual field loss, worsening light sensitivity, orbital swelling.  Call or return to clinic as needed if these symptoms worsen or fail to improve as anticipated. Consider washing glasses with hot soapy water daily also until symptoms resolved.  Exitcare handouts on bacterial/viral and allergic conjunctivitis. Patient  verbalized agreement and understanding of treatment plan and had no further questions at this time.  P2: Hand washing, avoid contact use-wear glasses.   Discussed warm winter trees already blooming recommend starting spring allergy regimen now as will worsen as we approach February.  Patient has claritin at home.  Patient may use normal saline nasal spray 2 sprays each nostril q2h wa as needed. flonase 1 spray each nostril  BID OTC  Patient denied personal or family history of ENT cancer.  OTC antihistamine of choice claritin/zyrtec 10mg  po daily.  Avoid triggers if possible.  Shower prior to bedtime if exposed to triggers.  If allergic dust/dust mites recommend mattress/pillow covers/encasements; washing linens, vacuuming, sweeping, dusting weekly.  Call or return to clinic as needed if these symptoms worsen or fail to improve as anticipated.   Exitcare handout on allergic rhinitis, sinusitis and sinus rinse given to patient.  Patient verbalized understanding of instructions, agreed with plan of care and had no further questions at this time.  P2:  Avoidance and hand washing.

## 2020-04-15 NOTE — Patient Instructions (Addendum)
Sinusitis, Adult Sinusitis is inflammation of your sinuses. Sinuses are hollow spaces in the bones around your face. Your sinuses are located:  Around your eyes.  In the middle of your forehead.  Behind your nose.  In your cheekbones. Mucus normally drains out of your sinuses. When your nasal tissues become inflamed or swollen, mucus can become trapped or blocked. This allows bacteria, viruses, and fungi to grow, which leads to infection. Most infections of the sinuses are caused by a virus. Sinusitis can develop quickly. It can last for up to 4 weeks (acute) or for more than 12 weeks (chronic). Sinusitis often develops after a cold. What are the causes? This condition is caused by anything that creates swelling in the sinuses or stops mucus from draining. This includes:  Allergies.  Asthma.  Infection from bacteria or viruses.  Deformities or blockages in your nose or sinuses.  Abnormal growths in the nose (nasal polyps).  Pollutants, such as chemicals or irritants in the air.  Infection from fungi (rare). What increases the risk? You are more likely to develop this condition if you:  Have a weak body defense system (immune system).  Do a lot of swimming or diving.  Overuse nasal sprays.  Smoke. What are the signs or symptoms? The main symptoms of this condition are pain and a feeling of pressure around the affected sinuses. Other symptoms include:  Stuffy nose or congestion.  Thick drainage from your nose.  Swelling and warmth over the affected sinuses.  Headache.  Upper toothache.  A cough that may get worse at night.  Extra mucus that collects in the throat or the back of the nose (postnasal drip).  Decreased sense of smell and taste.  Fatigue.  A fever.  Sore throat.  Bad breath. How is this diagnosed? This condition is diagnosed based on:  Your symptoms.  Your medical history.  A physical exam.  Tests to find out if your condition is  acute or chronic. This may include: ? Checking your nose for nasal polyps. ? Viewing your sinuses using a device that has a light (endoscope). ? Testing for allergies or bacteria. ? Imaging tests, such as an MRI or CT scan. In rare cases, a bone biopsy may be done to rule out more serious types of fungal sinus disease. How is this treated? Treatment for sinusitis depends on the cause and whether your condition is chronic or acute.  If caused by a virus, your symptoms should go away on their own within 10 days. You may be given medicines to relieve symptoms. They include: ? Medicines that shrink swollen nasal passages (topical intranasal decongestants). ? Medicines that treat allergies (antihistamines). ? A spray that eases inflammation of the nostrils (topical intranasal corticosteroids). ? Rinses that help get rid of thick mucus in your nose (nasal saline washes).  If caused by bacteria, your health care provider may recommend waiting to see if your symptoms improve. Most bacterial infections will get better without antibiotic medicine. You may be given antibiotics if you have: ? A severe infection. ? A weak immune system.  If caused by narrow nasal passages or nasal polyps, you may need to have surgery. Follow these instructions at home: Medicines  Take, use, or apply over-the-counter and prescription medicines only as told by your health care provider. These may include nasal sprays.  If you were prescribed an antibiotic medicine, take it as told by your health care provider. Do not stop taking the antibiotic even if you start   to feel better. Hydrate and humidify  Drink enough fluid to keep your urine pale yellow. Staying hydrated will help to thin your mucus.  Use a cool mist humidifier to keep the humidity level in your home above 50%.  Inhale steam for 10-15 minutes, 3-4 times a day, or as told by your health care provider. You can do this in the bathroom while a hot shower is  running.  Limit your exposure to cool or dry air.   Rest  Rest as much as possible.  Sleep with your head raised (elevated).  Make sure you get enough sleep each night. General instructions  Apply a warm, moist washcloth to your face 3-4 times a day or as told by your health care provider. This will help with discomfort.  Wash your hands often with soap and water to reduce your exposure to germs. If soap and water are not available, use hand sanitizer.  Do not smoke. Avoid being around people who are smoking (secondhand smoke).  Keep all follow-up visits as told by your health care provider. This is important.   Contact a health care provider if:  You have a fever.  Your symptoms get worse.  Your symptoms do not improve within 10 days. Get help right away if:  You have a severe headache.  You have persistent vomiting.  You have severe pain or swelling around your face or eyes.  You have vision problems.  You develop confusion.  Your neck is stiff.  You have trouble breathing. Summary  Sinusitis is soreness and inflammation of your sinuses. Sinuses are hollow spaces in the bones around your face.  This condition is caused by nasal tissues that become inflamed or swollen. The swelling traps or blocks the flow of mucus. This allows bacteria, viruses, and fungi to grow, which leads to infection.  If you were prescribed an antibiotic medicine, take it as told by your health care provider. Do not stop taking the antibiotic even if you start to feel better.  Keep all follow-up visits as told by your health care provider. This is important. This information is not intended to replace advice given to you by your health care provider. Make sure you discuss any questions you have with your health care provider. Document Revised: 08/13/2017 Document Reviewed: 08/13/2017 Elsevier Patient Education  2021 [employer] Allergic Rhinitis, Adult  Allergic rhinitis is an  allergic reaction that affects the mucous membrane inside the nose. The mucous membrane is the tissue that produces mucus. There are two types of allergic rhinitis:  Seasonal. This type is also called hay fever and happens only during certain seasons.  Perennial. This type can happen at any time of the year. Allergic rhinitis cannot be spread from person to person. This condition can be mild, moderate, or severe. It can develop at any age and may be outgrown. What are the causes? This condition is caused by allergens. These are things that can cause an allergic reaction. Allergens may differ for seasonal allergic rhinitis and perennial allergic rhinitis.  Seasonal allergic rhinitis is triggered by pollen. Pollen can come from grasses, trees, and weeds.  Perennial allergic rhinitis may be triggered by: ? Dust mites. ? Proteins in a pet's urine, saliva, or dander. Dander is dead skin cells from a pet. ? Smoke, mold, or car fumes. What increases the risk? You are more likely to develop this condition if you have a family history of allergies or other conditions related to allergies, including:  Allergic  conjunctivitis. This is inflammation of parts of the eyes and eyelids.  Asthma. This condition affects the lungs and makes it hard to breathe.  Atopic dermatitis or eczema. This is long term (chronic) inflammation of the skin.  Food allergies. What are the signs or symptoms? Symptoms of this condition include:  Sneezing or coughing.  A stuffy nose (nasal congestion), itchy nose, or nasal discharge.  Itchy eyes and tearing of the eyes.  A feeling of mucus dripping down the back of your throat (postnasal drip).  Trouble sleeping.  Tiredness or fatigue.  Headache.  Sore throat. How is this diagnosed? This condition may be diagnosed with your symptoms, medical history, and physical exam. Your health care provider may check for related conditions, such as:  Asthma.  Pink eye.  This is eye inflammation caused by infection (conjunctivitis).  Ear infection.  Upper respiratory infection. This is an infection in the nose, throat, or upper airways. You may also have tests to find out which allergens trigger your symptoms. These may include skin tests or blood tests. How is this treated? There is no cure for this condition, but treatment can help control symptoms. Treatment may include:  Taking medicines that block allergy symptoms, such as corticosteroids and antihistamines. Medicine may be given as a shot, nasal spray, or pill.  Avoiding any allergens.  Being exposed again and again to tiny amounts of allergens to help you build a defense against allergens (immunotherapy). This is done if other treatments have not helped. It may include: ? Allergy shots. These are injected medicines that have small amounts of allergen in them. ? Sublingual immunotherapy. This involves taking small doses of a medicine with allergen in it under your tongue. If these treatments do not work, your health care provider may prescribe newer, stronger medicines. Follow these instructions at home: Avoiding allergens Find out what you are allergic to and avoid those allergens. These are some things you can do to help avoid allergens:  If you have perennial allergies: ? Replace carpet with wood, tile, or vinyl flooring. Carpet can trap dander and dust. ? Do not smoke. Do not allow smoking in your home. ? Change your heating and air conditioning filters at least once a month.  If you have seasonal allergies, take these steps during allergy season: ? Keep windows closed as much as possible. ? Plan outdoor activities when pollen counts are lowest. Check pollen counts before you plan outdoor activities. ? When coming indoors, change clothing and shower before sitting on furniture or bedding.  If you have a pet in the house that produces allergens: ? Keep the pet out of the bedroom. ? Vacuum,  sweep, and dust regularly. General instructions  Take over-the-counter and prescription medicines only as told by your health care provider.  Drink enough fluid to keep your urine pale yellow.  Keep all follow-up visits as told by your health care provider. This is important. Where to find more information  American Academy of Allergy, Asthma & Immunology: www.aaaai.org Contact a health care provider if:  You have a fever.  You develop a cough that does not go away.  You make whistling sounds when you breathe (wheeze).  Your symptoms slow you down or stop you from doing your normal activities each day. Get help right away if:  You have shortness of breath. This symptom may represent a serious problem that is an emergency. Do not wait to see if the symptom will go away. Get medical help right away.  Call your local emergency services (911 in the [place] Do not drive yourself to the hospital. Summary  Allergic rhinitis may be managed by taking medicines as directed and avoiding allergens.  If you have seasonal allergies, keep windows closed as much as possible during allergy season.  Contact your health care provider if you develop a fever or a cough that does not go away. This information is not intended to replace advice given to you by your health care provider. Make sure you discuss any questions you have with your health care provider. Document Revised: 05/02/2019 Document Reviewed: 03/11/2019 Elsevier Patient Education  2021 [employer]. How to Perform a Sinus Rinse A sinus rinse is a home treatment that is used to rinse your sinuses with a sterile mixture of salt and water (saline solution). Sinuses are air-filled spaces in your skull behind the bones of your face and forehead that open into your nasal cavity. A sinus rinse can help to clear mucus, dirt, dust, or pollen from your nasal cavity. You may do a sinus rinse when you have a cold, a virus, nasal allergy symptoms, a  sinus infection, or stuffiness in your nose or sinuses. Talk with your health care provider about whether a sinus rinse might help you. What are the risks? A sinus rinse is generally safe and effective. However, there are a few risks, which include:  A burning sensation in your sinuses. This may happen if you do not make the saline solution as directed. Be sure to follow all directions when making the saline solution.  Nasal irritation.  Infection from contaminated water. This is rare, but possible. Do not do a sinus rinse if you have had ear or nasal surgery, ear infection, or blocked ears. Supplies needed:  Saline solution or powder.  Distilled or sterile water to mix with saline powder. ? You may use boiled and cooled tap water. Boil tap water for 5 minutes; cool until it is lukewarm. Use within 24 hours. ? Do not use regular tap water to mix with the saline solution.  Neti pot or nasal rinse bottle. These supplies release the saline solution into your nose and through your sinuses. Neti pots and nasal rinse bottles can be purchased at [facility] local [facility], a health food store, or online. How to perform a sinus rinse 1. Wash your hands with soap and water. 2. Wash your device according to the directions that came with the product and then dry it. 3. Use the solution that comes with your product or one that is sold separately in stores. Follow the mixing directions on the package to mix with sterile or distilled water. 4. Fill the device with the amount of saline solution noted in the device instructions. 5. Stand over a sink and tilt your head sideways over the sink. 6. Place the spout of the device in your upper nostril (the one closer to the ceiling). 7. Gently pour or squeeze the saline solution into your nasal cavity. The liquid should drain out from the lower nostril if you are not too congested. 8. While rinsing, breathe through your open mouth. 9. Gently blow your nose to clear  any mucus and rinse solution. Blowing too hard may cause ear pain. 10. Repeat in your other nostril. 11. Clean and rinse your device with clean water and then air-dry it. Talk with your health care provider or pharmacist if you have questions about how to do a sinus rinse.   Summary  A sinus rinse is  a home treatment that is used to rinse your sinuses with a sterile mixture of salt and water (saline solution).  A sinus rinse is generally safe and effective. Follow all instructions carefully.  Before doing a sinus rinse, talk with your health care provider about whether it would be helpful for you. This information is not intended to replace advice given to you by your health care provider. Make sure you discuss any questions you have with your health care provider. Document Revised: 12/23/2019 Document Reviewed: 12/23/2019 Elsevier Patient Education  2021 [employer]. Bacterial Conjunctivitis, Adult Bacterial conjunctivitis is an infection of the clear membrane that covers the white part of your eye and the inner surface of your eyelid (conjunctiva). When the blood vessels in your conjunctiva become inflamed, your eye becomes red or pink, and it will probably feel itchy. Bacterial conjunctivitis spreads very easily from person to person (is contagious). It also spreads easily from one eye to the other eye. What are the causes? This condition is caused by bacteria. You may get the infection if you come into close contact with:  A person who is infected with the bacteria.  Items that are contaminated with the bacteria, such as a face towel, contact lens solution, or eye makeup. What increases the risk? You are more likely to develop this condition if you:  Are exposed to other people who have the infection.  Wear contact lenses.  Have a sinus infection.  Have had a recent eye injury or surgery.  Have a weak body defense system (immune system).  Have a medical condition that causes  dry eyes. What are the signs or symptoms? Symptoms of this condition include:  Thick, yellowish discharge from the eye. This may turn into a crust on the eyelid overnight and cause your eyelids to stick together.  Tearing or watery eyes.  Itchy eyes.  Burning feeling in your eyes.  Eye redness.  Swollen eyelids.  Blurred vision.   How is this diagnosed? This condition is diagnosed based on your symptoms and medical history. Your health care provider may also take a sample of discharge from your eye to find the cause of your infection. This is rarely done. How is this treated? This condition may be treated with:  Antibiotic eye drops or ointment to clear the infection more quickly and prevent the spread of infection to others.  Oral antibiotic medicines to treat infections that do not respond to drops or ointments or that last longer than 10 days.  Cool, wet cloths (cool compresses) placed on the eyes.  Artificial tears applied 2-6 times a day.   Follow these instructions at home: Medicines  Take or apply your antibiotic medicine as told by your health care provider. Do not stop taking or applying the antibiotic even if you start to feel better.  Take or apply over-the-counter and prescription medicines only as told by your health care provider.  Be very careful to avoid touching the edge of your eyelid with the eye-drop bottle or the ointment tube when you apply medicines to the affected eye. This will keep you from spreading the infection to your other eye or to other people. Managing discomfort  Gently wipe away any drainage from your eye with a warm, wet washcloth or a cotton ball.  Apply a clean, cool compress to your eye for 10-20 minutes, 3-4 times a day. General instructions  Do not wear contact lenses until the inflammation is gone and your health care provider says it  is safe to wear them again. Ask your health care provider how to sterilize or replace your  contact lenses before you use them again. Wear glasses until you can resume wearing contact lenses.  Avoid wearing eye makeup until the inflammation is gone. Throw away any old eye cosmetics that may be contaminated.  Change or wash your pillowcase every day.  Do not share towels or washcloths. This may spread the infection.  Wash your hands often with soap and water. Use paper towels to dry your hands.  Avoid touching or rubbing your eyes.  Do not drive or use heavy machinery if your vision is blurred. Contact a health care provider if:  You have a fever.  Your symptoms do not get better after 10 days. Get help right away if you have:  A fever and your symptoms suddenly get worse.  Severe pain when you move your eye.  Facial pain, redness, or swelling.  Sudden loss of vision. Summary  Bacterial conjunctivitis is an infection of the clear membrane that covers the white part of your eye and the inner surface of your eyelid (conjunctiva).  Bacterial conjunctivitis spreads very easily from person to person (is contagious).  Wash your hands often with soap and water. Use paper towels to dry your hands.  Take or apply your antibiotic medicine as told by your health care provider. Do not stop taking or applying the antibiotic even if you start to feel better.  Contact a health care provider if you have a fever or your symptoms do not get better after 10 days. This information is not intended to replace advice given to you by your health care provider. Make sure you discuss any questions you have with your health care provider. Document Revised: 07/02/2018 Document Reviewed: 10/17/2017 Elsevier Patient Education  2021 [employer]. Viral Conjunctivitis, Adult  Viral conjunctivitis is an inflammation of the clear membrane that covers the white part of the eye and the inner surface of the eyelid (conjunctiva). The inflammation is caused by a viral infection. The blood vessels in  the conjunctiva become enlarged, causing the eye to become red or pink and often itchy. It usually starts in one eye and goes to the other in a day or two. Infections often resolve over 1-2 weeks. Viral conjunctivitis is contagious. This means it can be easily passed from one person to another. This condition is often called pink eye. What are the causes? This condition is caused by a virus. It can be spread by touching objects that have been contaminated with the virus, such as doorknobs or towels, and then touching your eye. It can also be passed through tiny droplets, such as from coughing or sneezing. What increases the risk? You are more likely to develop this condition if you have a cold or the flu, or are in close contact with a person with pink eye. What are the signs or symptoms? Symptoms of this condition include:  Redness in the eye.  Tearing or watery eyes.  Itchy and irritated eyes.  Burning feeling in the eyes.  Clear drainage from the eye.  Swollen eyelids.  A gritty feeling in the eye.  Light sensitivity. This condition often occurs with other symptoms, such as nasal congestion, cough, and fever. How is this diagnosed? This condition is diagnosed with a medical history and physical exam. If you have discharge from your eye, the discharge may be tested to rule out other causes of conjunctivitis. How is this treated? Viral conjunctivitis  does not respond to medicines that kill bacteria (antibiotics). The condition most often resolves on its own in 1-2 weeks. If treatment is needed, it is aimed at relieving your symptoms and preventing the spread of infection. This may be done with artificial tear drops, antihistamine drops, or other eye medicines. In rare cases, steroid eye drops or anti-herpes virus medicines may be prescribed. Follow these instructions at home: Medicines  Take or apply over-the-counter and prescription medicines only as told by your health care  provider.  Do not touch the edge of the eyelid with the eye-drop bottle or ointment tube when applying medicines to the affected eye. This will prevent the spread of the infection to the other eye or to other people.   Eye care  Avoid touching or rubbing your eyes.  Apply a clean, cool, wet washcloth onto your eye for 10-20 minutes, 3-4 times per day, or as told by your health care provider.  If you wear contact lenses, do not wear them until the inflammation is gone and your health care provider says it is safe to wear them again. Ask your health care provider how to disinfect or replace your contact lenses before using them again. Wear glasses until you can resume wearing contacts.  Avoid wearing eye makeup until the inflammation is gone. Throw away any old eye cosmetics that may be contaminated.  Gently wipe away any crusting from your eye with a wet washcloth or a cotton ball. General instructions  Change or wash your pillowcase every day or as told by your health care provider.  Do not share towels, pillowcases, washcloths, eye makeup, makeup brushes, contact lenses, or eyeglasses. This may spread the infection.  Wash your hands often with soap and water. Use paper towels to dry your hands. If soap and water are not available, use hand sanitizer.  Avoid contact with other people until your eye is no longer red and tearing, or as told by your health care provider. Contact a health care provider if:  Your symptoms do not improve with treatment, or they get worse.  You have increased pain.  Your vision becomes blurry.  You have a fever.  You have facial pain, redness, or swelling.  You have yellow or green drainage coming from your eye.  You have new symptoms. Get help right away if:  You develop severe pain.  Your vision gets much worse. Summary  Viral conjunctivitis is an inflammation of the clear membrane that covers the white part of the eye and the inner surface of  the eyelid. It usually goes away in 1-2 weeks.  This condition is usually treated with medicines and cold compresses. Treatment focuses on relieving the symptoms.  This condition is very contagious. To prevent infection, avoid close contact with others, wash your hands often, and do not share towels or washcloths.  Contact a health care provider if your symptoms do not go away with treatment, or if you have more pain, poor vision, or swelling in the eyes.  Get help right away if you have severe pain or your vision gets much worse. This information is not intended to replace advice given to you by your health care provider. Make sure you discuss any questions you have with your health care provider. Document Revised: 02/10/2019 Document Reviewed: 01/24/2019 Elsevier Patient Education  2021 [employer]. Allergic Conjunctivitis, Adult Allergic conjunctivitis is inflammation of the conjunctiva. The conjunctiva is the thin, clear membrane that covers the white part of the eye and  the inner surface of the eyelid. In this condition:  The blood vessels in the conjunctiva become irritated and swell.  The eyes become red or pink and feel itchy. Allergic conjunctivitis cannot be spread from person to person. This condition can develop at any age and may be outgrown. What are the causes? This condition is caused by allergens. These are things that can cause an allergic reaction in some people but not in other people. Common allergens include:  Outdoor allergens, such as: ? Pollen, including pollen from grass and weeds. ? Mold spores. ? Car fumes.  Indoor allergens, such as: ? Dust. ? Smoke. ? Mold spores. ? Proteins in a pet's urine, saliva, or dander. What increases the risk? You may be more likely to develop this condition if you have a family history of these things:  Allergies.  Conditions caused by being exposed to allergens, such as: ? Allergic rhinitis. This is an allergic reaction  that affects the nose. ? Bronchial asthma. This condition affects the large airways in the lungs and makes breathing difficult. ? Atopic dermatitis (eczema). This is inflammation of the skin that is long-term (chronic). What are the signs or symptoms? Symptoms of this condition include eyes that are:  Itchy.  Red.  Watery.  Puffy. Your eyes may also:  Sting or burn.  Have clear fluid draining from them.  Have thick mucus discharge and pain (vernal conjunctivitis). How is this diagnosed? This condition may be diagnosed by:  Your medical history.  A physical exam.  Tests of the fluid draining from your eyes to rule out other causes.  Other tests to confirm the diagnosis, including: ? Testing for allergies. The skin may be pricked with a tiny needle. The pricked area is then exposed to small amounts of allergens. ? Testing for other eye conditions. Tests may include:  Blood tests.  Tissue scrapings from your eyelid. The tissue is then checked under a microscope. How is this treated? This condition may be treated with:  Cold, wet cloths (cold compresses) to soothe itching and swelling.  Washing the face to remove allergens.  Eye drops. These may be prescription or over-the-counter. You may need to try different types to see which one works best for you, such as: ? Eye drops that block the allergic reaction (antihistamine). ? Eye drops that reduce swelling and irritation (anti-inflammatory). ? Steroid eye drops, which may be given if other treatments have not worked (vernal conjunctivitis).  Oral antihistamine medicines. These are medicines taken by mouth to lessen your allergic reaction. You may need these if eye drops do not help or are difficult to use.   Follow these instructions at home: Eye care  Apply a clean, cold compress to your eyes for 10-20 minutes, 3-4 times a day.  Do not touch or rub your eyes.  Do not wear contact lenses until the inflammation is  gone. Wear glasses instead.  Do not wear eye makeup until the inflammation is gone. General instructions  Avoid known allergens whenever possible.  Take or apply over-the-counter and prescription medicines only as told by your health care provider. These include any eye drops.  Drink enough fluid to keep your urine pale yellow.  Keep all follow-up visits as told by your health care provider. This is important. Contact a health care provider if:  Your symptoms get worse or do not get better with treatment.  You have mild eye pain.  You become sensitive to light.  You have spots or blisters  on your eyes.  You have pus draining from your eyes.  You have a fever. Get help right away if:  You have redness, swelling, or other symptoms in only one eye.  Your vision is blurred or you have other vision changes.  You have severe eye pain. Summary  Allergic conjunctivitis is inflammation of the clear membrane that covers the white part of the eye and the inner surface of the eyelid.  Take or apply over-the-counter and prescription medicines only as told by your health care provider. These include eye drops.  Do not touch or rub your eyes.  Contact a health care provider if your symptoms get worse or do not get better with treatment. This information is not intended to replace advice given to you by your health care provider. Make sure you discuss any questions you have with your health care provider. Document Revised: 02/03/2019 Document Reviewed: 02/03/2019 Elsevier Patient Education  2021 [employer].

## 2020-05-10 ENCOUNTER — Telehealth: Payer: Self-pay | Admitting: *Deleted

## 2020-05-10 DIAGNOSIS — R5081 Fever presenting with conditions classified elsewhere: Secondary | ICD-10-CM

## 2020-05-10 NOTE — Telephone Encounter (Signed)
RN notified by HR that pt reported being diagnosed with strep. Reviewed UC notes in Epic. Started on Amoxicillin on Sunday 2/13. Pt with sore throat, white spots back of throat, intermittent fever. Temp today has been about 99.7. Tmax 101 Sat night. Pt advised he is planning on calling out for tomorrow due to sx and fever. Has taken Tylenol earlier today. Advised chloraseptic spray, salt water gargles, warm salty broths for sx. If sx worsen or fail to improve as expected, or new sx develop, will consider covid testing.

## 2020-05-11 ENCOUNTER — Encounter: Payer: Self-pay | Admitting: *Deleted

## 2020-05-11 NOTE — Telephone Encounter (Signed)
Reviewed RN Rolly Salter note patient with new strep throat diagnosis urgent care and still running low grade temp after initiation amoxicillin.  Patient instructed to stay home and ensure fever resolved x 24 hours prior to return to work onsite.  Agreed with plan of care.

## 2020-05-12 ENCOUNTER — Other Ambulatory Visit: Payer: Self-pay | Admitting: Infectious Diseases

## 2020-05-12 DIAGNOSIS — N521 Erectile dysfunction due to diseases classified elsewhere: Secondary | ICD-10-CM

## 2020-05-12 NOTE — Telephone Encounter (Signed)
Ok to continue refill tadalafil?

## 2020-05-12 NOTE — Telephone Encounter (Signed)
Patient returned call stated feeling much better today no fever and white spots in back of throat have resolved.  Feeling 50% better from yesterday.  Throat a little sore but denied difficulty swallowing/talking/breathing.  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 Patient denied n/v/d.  Had some heartburn yesterday as finally feeling well enough to eat normal diet and thinks he overdid it.  Denied rash. Patient A&Ox3, spoke full sentences without difficulty, no cough/throat clearing/nasal congestion during 4 minute telephone call.  HR notified patient cleared to return onsite 05/13/2020.

## 2020-05-13 NOTE — Telephone Encounter (Signed)
Patient did RTW today, as expected. Feels back to baseline. No further needs. Closing encounter.

## 2020-05-14 NOTE — Telephone Encounter (Signed)
Noted reviewed RN Rolly Salter note patient feels back to baseline and returned to work

## 2020-05-18 ENCOUNTER — Other Ambulatory Visit: Payer: Self-pay | Admitting: Infectious Diseases

## 2020-05-18 DIAGNOSIS — B20 Human immunodeficiency virus [HIV] disease: Secondary | ICD-10-CM

## 2020-05-19 ENCOUNTER — Ambulatory Visit (INDEPENDENT_AMBULATORY_CARE_PROVIDER_SITE_OTHER): Payer: PRIVATE HEALTH INSURANCE | Admitting: Family Medicine

## 2020-05-19 ENCOUNTER — Encounter: Payer: Self-pay | Admitting: Family Medicine

## 2020-05-19 ENCOUNTER — Other Ambulatory Visit: Payer: Self-pay

## 2020-05-19 ENCOUNTER — Other Ambulatory Visit: Payer: Self-pay | Admitting: Infectious Diseases

## 2020-05-19 VITALS — BP 110/68 | HR 74 | Temp 98.2°F | Ht 71.0 in | Wt 181.4 lb

## 2020-05-19 DIAGNOSIS — Z1331 Encounter for screening for depression: Secondary | ICD-10-CM

## 2020-05-19 DIAGNOSIS — Z0000 Encounter for general adult medical examination without abnormal findings: Secondary | ICD-10-CM

## 2020-05-19 DIAGNOSIS — Z1211 Encounter for screening for malignant neoplasm of colon: Secondary | ICD-10-CM | POA: Diagnosis not present

## 2020-05-19 DIAGNOSIS — Z125 Encounter for screening for malignant neoplasm of prostate: Secondary | ICD-10-CM

## 2020-05-19 DIAGNOSIS — E781 Pure hyperglyceridemia: Secondary | ICD-10-CM

## 2020-05-19 DIAGNOSIS — B20 Human immunodeficiency virus [HIV] disease: Secondary | ICD-10-CM

## 2020-05-19 LAB — COMPREHENSIVE METABOLIC PANEL
ALT: 27 U/L (ref 0–53)
AST: 29 U/L (ref 0–37)
Albumin: 4.5 g/dL (ref 3.5–5.2)
Alkaline Phosphatase: 42 U/L (ref 39–117)
BUN: 22 mg/dL (ref 6–23)
CO2: 31 mEq/L (ref 19–32)
Calcium: 10.2 mg/dL (ref 8.4–10.5)
Chloride: 98 mEq/L (ref 96–112)
Creatinine, Ser: 1.04 mg/dL (ref 0.40–1.50)
GFR: 83.75 mL/min (ref >60.00)
Glucose, Bld: 98 mg/dL (ref 70–99)
Potassium: 4.6 mEq/L (ref 3.5–5.1)
Sodium: 135 mEq/L (ref 135–145)
Total Bilirubin: 0.5 mg/dL (ref 0.2–1.2)
Total Protein: 7.8 g/dL (ref 6.0–8.3)

## 2020-05-19 LAB — LIPID PANEL
Cholesterol: 213 mg/dL — ABNORMAL HIGH (ref 0–200)
HDL: 38.5 mg/dL — ABNORMAL LOW (ref >39.00)
LDL Cholesterol: 143 mg/dL — ABNORMAL HIGH (ref 0–99)
NonHDL: 174.25
Total CHOL/HDL Ratio: 6
Triglycerides: 158 mg/dL — ABNORMAL HIGH (ref 0.0–149.0)
VLDL: 31.6 mg/dL (ref 0.0–40.0)

## 2020-05-19 LAB — PSA: PSA: 0.21 ng/mL (ref 0.10–4.00)

## 2020-05-19 MED ORDER — TADALAFIL 20 MG PO TABS: 10.0000 mg | ORAL_TABLET | ORAL | 2 refills | Status: DC | PRN

## 2020-05-19 NOTE — Progress Notes (Signed)
Chief Complaint  Patient presents with  . Annual Exam    Well Male John Farrell is here for a complete physical.   His last physical was >1 year ago.  Current diet: in general, diet is fair.  Current exercise: walking, running on treadmill Weight trend: stable Fatigue out of ordinary? No. Seat belt? Yes.   Loss of interest in activities or depression in last 2 weeks? No  Health maintenance Shingrix- No Colonoscopy- No Tetanus- Yes HIV- Yes Hep C- Yes   Past Medical History:  Diagnosis Date  . DEPRESSION, MILD   . GERD (gastroesophageal reflux disease)   . HIV infection (HCC)   . Hyperlipidemia   . Hypertension   . HYPERTRIGLYCERIDEMIA   . Sinusitis   . SYPHILIS   . UNSPECIFIED NEURALGIA NEURITIS AND RADICULITIS       Past Surgical History:  Procedure Laterality Date  . CHOLECYSTECTOMY  2014    Medications  Current Outpatient Medications on File Prior to Visit  Medication Sig Dispense Refill  . amoxicillin (AMOXIL) 500 MG capsule Take 500 mg by mouth 2 (two) times daily.    Marland Kitchen BIKTARVY 50-200-25 MG TABS tablet TAKE 1 TABLET BY MOUTH  DAILY 30 tablet 5  . fenofibrate (TRICOR) 145 MG tablet Take 1 tablet (145 mg total) by mouth daily. 90 tablet 4  . fluticasone (FLONASE) 50 MCG/ACT nasal spray Place 1 spray into both nostrils daily.    Marland Kitchen lisinopril (ZESTRIL) 5 MG tablet Take 1 tablet (5 mg total) by mouth daily. 90 tablet 4  . loratadine (CLARITIN) 10 MG tablet Take 1 tablet (10 mg total) by mouth daily as needed for allergies. 30 tablet 11  . sodium chloride (OCEAN) 0.65 % SOLN nasal spray Place 2 sprays into both nostrils every 2 (two) hours while awake.  0    Allergies No Known Allergies  Family History Family History  Problem Relation Age of Onset  . Hypertension Mother   . Colon polyps Mother   . Hypertension Father   . Heart disease Father   . Heart attack Father   . Hyperlipidemia Sister   . Hypertension Sister   . Diabetes Brother   .  Hyperlipidemia Brother   . Hearing loss Maternal Grandmother   . Heart disease Maternal Grandmother   . Hyperlipidemia Maternal Grandmother   . Hypertension Maternal Grandmother   . Cancer Maternal Grandfather        lung cancer  . Heart attack Paternal Grandmother   . Heart attack Paternal Grandfather     Review of Systems: Constitutional:  no fevers Eye:  no recent significant change in vision Ear/Nose/Mouth/Throat:  Ears:  no hearing loss Nose/Mouth/Throat:  no complaints of nasal congestion, no sore throat Cardiovascular:  no chest pain Respiratory:  no shortness of breath Gastrointestinal:  no change in bowel habits GU:  Male: negative for dysuria, frequency Musculoskeletal/Extremities:  +R side pain Integumentary (Skin/Breast):  no abnormal skin lesions reported Neurologic:  no headaches Endocrine: No unexpected weight changes Hematologic/Lymphatic:  no abnormal bleeding  Exam BP 110/68 (BP Location: Left Arm, Patient Position: Sitting, Cuff Size: Normal)   Pulse 74   Temp 98.2 F (36.8 C) (Oral)   Ht 5\' 11"  (1.803 m)   Wt 181 lb 6 oz (82.3 kg)   SpO2 95%   BMI 25.30 kg/m  General:  well developed, well nourished, in no apparent distress Skin:  no significant moles, warts, or growths Head:  no masses, lesions, or tenderness Eyes:  pupils equal and round, sclera anicteric without injection Ears:  canals without lesions, TMs shiny without retraction, no obvious effusion, no erythema Nose:  nares patent, septum midline, mucosa normal Throat/Pharynx:  lips and gingiva without lesion; tongue and uvula midline; non-inflamed pharynx; no exudates or postnasal drainage Neck: neck supple without adenopathy, thyromegaly, or masses Cardiac: RRR, no bruits, no LE edema Lungs:  clear to auscultation, breath sounds equal bilaterally, no respiratory distress Rectal: Deferred Musculoskeletal: +TTP over R11 on R; symmetrical muscle groups noted without atrophy or deformity;  hamstrings w poor ROM b/l Neuro:  gait normal; deep tendon reflexes normal and symmetric Psych: well oriented with normal range of affect and appropriate judgment/insight  Assessment and Plan  Well adult exam  Screen for colon cancer - Plan: Ambulatory referral to Gastroenterology  Hypertriglyceridemia - Plan: Lipid panel, Comprehensive metabolic panel  Screening for prostate cancer - Plan: PSA  Depression screening negative   Well 50 y.o. male. Counseled on diet and exercise. Counseled on risks and benefits of prostate cancer screening with PSA. The patient agrees to undergo testing. Immunizations, labs, and further orders as above. Needs to stretch. Yoga rec'd also.  Follow up in 6 mo for med ck or prn. The patient voiced understanding and agreement to the plan.  Jilda Roche Vandiver, DO 05/19/20 8:32 AM

## 2020-05-19 NOTE — Patient Instructions (Addendum)
If you do not hear anything about your referral in the next 1-2 weeks, call our office and ask for an update.  Give [place] 2-3 business days to get the results of your labs back.   Keep the diet clean and stay active.  The new Shingrix vaccine (for shingles) is a 2 shot series. It can make people feel low energy, achy and almost like they have the flu for 48 hours after injection. Please plan accordingly when deciding on when to get this shot. Call our office for a nurse visit appointment to get this. The second shot of the series is less severe regarding the side effects, but it still lasts 48 hours.   Use [facility] with the tadalafil prescription.   Consider YouTube for yoga.   Let us know if you need anything.  EXERCISES  RANGE OF MOTION (ROM) AND STRETCHING EXERCISES - Low Back Pain Most people with lower back pain will find that their symptoms get worse with excessive bending forward (flexion) or arching at the lower back (extension). The exercises that will help resolve your symptoms will focus on the opposite motion.  If you have pain, numbness or tingling which travels down into your buttocks, leg or foot, the goal of the therapy is for these symptoms to move closer to your back and eventually resolve. Sometimes, these leg symptoms will get better, but your lower back pain may worsen. This is often an indication of progress in your rehabilitation. Be very alert to any changes in your symptoms and the activities in which you participated in the 24 hours prior to the change. Sharing this information with your caregiver will allow him or her to most efficiently treat your condition. These exercises may help you when beginning to rehabilitate your injury. Your symptoms may resolve with or without further involvement from your physician, physical therapist or athletic trainer. While completing these exercises, remember:   Restoring tissue flexibility helps normal motion to return to the joints. This  allows healthier, less painful movement and activity.  An effective stretch should be held for at least 30 seconds.  A stretch should never be painful. You should only feel a gentle lengthening or release in the stretched tissue. FLEXION RANGE OF MOTION AND STRETCHING EXERCISES:  STRETCH - Flexion, Single Knee to Chest   Lie on a firm bed or floor with both legs extended in front of you.  Keeping one leg in contact with the floor, bring your opposite knee to your chest. Hold your leg in place by either grabbing behind your thigh or at your knee.  Pull until you feel a gentle stretch in your low back. Hold 30 seconds.  Slowly release your grasp and repeat the exercise with the opposite side. Repeat 2 times. Complete this exercise 3 times per week.   STRETCH - Flexion, Double Knee to Chest  Lie on a firm bed or floor with both legs extended in front of you.  Keeping one leg in contact with the floor, bring your opposite knee to your chest.  Tense your stomach muscles to support your back and then lift your other knee to your chest. Hold your legs in place by either grabbing behind your thighs or at your knees.  Pull both knees toward your chest until you feel a gentle stretch in your low back. Hold 30 seconds.  Tense your stomach muscles and slowly return one leg at a time to the floor. Repeat 2 times. Complete this exercise 3 times  per week.   STRETCH - Low Trunk Rotation  Lie on a firm bed or floor. Keeping your legs in front of you, bend your knees so they are both pointed toward the ceiling and your feet are flat on the floor.  Extend your arms out to the side. This will stabilize your upper body by keeping your shoulders in contact with the floor.  Gently and slowly drop both knees together to one side until you feel a gentle stretch in your low back. Hold for 30 seconds.  Tense your stomach muscles to support your lower back as you bring your knees back to the starting  position. Repeat the exercise to the other side. Repeat 2 times. Complete this exercise at least 3 times per week.   EXTENSION RANGE OF MOTION AND FLEXIBILITY EXERCISES:  STRETCH - Extension, Prone on Elbows   Lie on your stomach on the floor, a bed will be too soft. Place your palms about shoulder width apart and at the height of your head.  Place your elbows under your shoulders. If this is too painful, stack pillows under your chest.  Allow your body to relax so that your hips drop lower and make contact more completely with the floor.  Hold this position for 30 seconds.  Slowly return to lying flat on the floor. Repeat 2 times. Complete this exercise 3 times per week.   RANGE OF MOTION - Extension, Prone Press Ups  Lie on your stomach on the floor, a bed will be too soft. Place your palms about shoulder width apart and at the height of your head.  Keeping your back as relaxed as possible, slowly straighten your elbows while keeping your hips on the floor. You may adjust the placement of your hands to maximize your comfort. As you gain motion, your hands will come more underneath your shoulders.  Hold this position 30 seconds.  Slowly return to lying flat on the floor. Repeat 2 times. Complete this exercise 3 times per week.   RANGE OF MOTION- Quadruped, Neutral Spine   Assume a hands and knees position on a firm surface. Keep your hands under your shoulders and your knees under your hips. You may place padding under your knees for comfort.  Drop your head and point your tailbone toward the ground below you. This will round out your lower back like an angry cat. Hold this position for 30 seconds.  Slowly lift your head and release your tail bone so that your back sags into a large arch, like an old horse.  Hold this position for 30 seconds.  Repeat this until you feel limber in your low back.  Now, find your "sweet spot." This will be the most comfortable position  somewhere between the two previous positions. This is your neutral spine. Once you have found this position, tense your stomach muscles to support your low back.  Hold this position for 30 seconds. Repeat 2 times. Complete this exercise 3 times per week.   STRENGTHENING EXERCISES - Low Back Sprain These exercises may help you when beginning to rehabilitate your injury. These exercises should be done near your "sweet spot." This is the neutral, low-back arch, somewhere between fully rounded and fully arched, that is your least painful position. When performed in this safe range of motion, these exercises can be used for people who have either a flexion or extension based injury. These exercises may resolve your symptoms with or without further involvement from your physician, physical  therapist or athletic trainer. While completing these exercises, remember:   Muscles can gain both the endurance and the strength needed for everyday activities through controlled exercises.  Complete these exercises as instructed by your physician, physical therapist or athletic trainer. Increase the resistance and repetitions only as guided.  You may experience muscle soreness or fatigue, but the pain or discomfort you are trying to eliminate should never worsen during these exercises. If this pain does worsen, stop and make certain you are following the directions exactly. If the pain is still present after adjustments, discontinue the exercise until you can discuss the trouble with your caregiver.  STRENGTHENING - Deep Abdominals, Pelvic Tilt   Lie on a firm bed or floor. Keeping your legs in front of you, bend your knees so they are both pointed toward the ceiling and your feet are flat on the floor.  Tense your lower abdominal muscles to press your low back into the floor. This motion will rotate your pelvis so that your tail bone is scooping upwards rather than pointing at your feet or into the floor. With a  gentle tension and even breathing, hold this position for 3 seconds. Repeat 2 times. Complete this exercise 3 times per week.   STRENGTHENING - Abdominals, Crunches   Lie on a firm bed or floor. Keeping your legs in front of you, bend your knees so they are both pointed toward the ceiling and your feet are flat on the floor. Cross your arms over your chest.  Slightly tip your chin down without bending your neck.  Tense your abdominals and slowly lift your trunk high enough to just clear your shoulder blades. Lifting higher can put excessive stress on the lower back and does not further strengthen your abdominal muscles.  Control your return to the starting position. Repeat 2 times. Complete this exercise 3 times per week.   STRENGTHENING - Quadruped, Opposite UE/LE Lift   Assume a hands and knees position on a firm surface. Keep your hands under your shoulders and your knees under your hips. You may place padding under your knees for comfort.  Find your neutral spine and gently tense your abdominal muscles so that you can maintain this position. Your shoulders and hips should form a rectangle that is parallel with the floor and is not twisted.  Keeping your trunk steady, lift your right hand no higher than your shoulder and then your left leg no higher than your hip. Make sure you are not holding your breath. Hold this position for 30 seconds.  Continuing to keep your abdominal muscles tense and your back steady, slowly return to your starting position. Repeat with the opposite arm and leg. Repeat 2 times. Complete this exercise 3 times per week.   STRENGTHENING - Abdominals and Quadriceps, Straight Leg Raise   Lie on a firm bed or floor with both legs extended in front of you.  Keeping one leg in contact with the floor, bend the other knee so that your foot can rest flat on the floor.  Find your neutral spine, and tense your abdominal muscles to maintain your spinal position  throughout the exercise.  Slowly lift your straight leg off the floor about 6 inches for a count of 3, making sure to not hold your breath.  Still keeping your neutral spine, slowly lower your leg all the way to the floor. Repeat this exercise with each leg 2 times. Complete this exercise 3 times per week.  POSTURE AND BODY  MECHANICS CONSIDERATIONS - Low Back Sprain Keeping correct posture when sitting, standing or completing your activities will reduce the stress put on different body tissues, allowing injured tissues a chance to heal and limiting painful experiences. The following are general guidelines for improved posture.  While reading these guidelines, remember:  The exercises prescribed by your provider will help you have the flexibility and strength to maintain correct postures.  The correct posture provides the best environment for your joints to work. All of your joints have less wear and tear when properly supported by a spine with good posture. This means you will experience a healthier, less painful body.  Correct posture must be practiced with all of your activities, especially prolonged sitting and standing. Correct posture is as important when doing repetitive low-stress activities (typing) as it is when doing a single heavy-load activity (lifting).  RESTING POSITIONS Consider which positions are most painful for you when choosing a resting position. If you have pain with flexion-based activities (sitting, bending, stooping, squatting), choose a position that allows you to rest in a less flexed posture. You would want to avoid curling into a fetal position on your side. If your pain worsens with extension-based activities (prolonged standing, working overhead), avoid resting in an extended position such as sleeping on your stomach. Most people will find more comfort when they rest with their spine in a more neutral position, neither too rounded nor too arched. Lying on a non-sagging  bed on your side with a pillow between your knees, or on your back with a pillow under your knees will often provide some relief. Keep in mind, being in any one position for a prolonged period of time, no matter how correct your posture, can still lead to stiffness.  PROPER SITTING POSTURE In order to minimize stress and discomfort on your spine, you must sit with correct posture. Sitting with good posture should be effortless for a healthy body. Returning to good posture is a gradual process. Many people can work toward this most comfortably by using various supports until they have the flexibility and strength to maintain this posture on their own. When sitting with proper posture, your ears will fall over your shoulders and your shoulders will fall over your hips. You should use the back of the chair to support your upper back. Your lower back will be in a neutral position, just slightly arched. You may place a small pillow or folded towel at the base of your lower back for  support.  When working at a desk, create an environment that supports good, upright posture. Without extra support, muscles tire, which leads to excessive strain on joints and other tissues. Keep these recommendations in mind:  CHAIR:  A chair should be able to slide under your desk when your back makes contact with the back of the chair. This allows you to work closely.  The chair's height should allow your eyes to be level with the upper part of your monitor and your hands to be slightly lower than your elbows.  BODY POSITION  Your feet should make contact with the floor. If this is not possible, use a foot rest.  Keep your ears over your shoulders. This will reduce stress on your neck and low back.  INCORRECT SITTING POSTURES  If you are feeling tired and unable to assume a healthy sitting posture, do not slouch or slump. This puts excessive strain on your back tissues, causing more damage and pain. Healthier options  include:  Using more support, like a lumbar pillow.  Switching tasks to something that requires you to be upright or walking.  Talking a brief walk.  Lying down to rest in a neutral-spine position.  PROLONGED STANDING WHILE SLIGHTLY LEANING FORWARD  When completing a task that requires you to lean forward while standing in one place for a long time, place either foot up on a stationary 2-4 inch high object to help maintain the best posture. When both feet are on the ground, the lower back tends to lose its slight inward curve. If this curve flattens (or becomes too large), then the back and your other joints will experience too much stress, tire more quickly, and can cause pain.  CORRECT STANDING POSTURES Proper standing posture should be assumed with all daily activities, even if they only take a few moments, like when brushing your teeth. As in sitting, your ears should fall over your shoulders and your shoulders should fall over your hips. You should keep a slight tension in your abdominal muscles to brace your spine. Your tailbone should point down to the ground, not behind your body, resulting in an over-extended swayback posture.   INCORRECT STANDING POSTURES  Common incorrect standing postures include a forward head, locked knees and/or an excessive swayback. WALKING Walk with an upright posture. Your ears, shoulders and hips should all line-up.  PROLONGED ACTIVITY IN A FLEXED POSITION When completing a task that requires you to bend forward at your waist or lean over a low surface, try to find a way to stabilize 3 out of 4 of your limbs. You can place a hand or elbow on your thigh or rest a knee on the surface you are reaching across. This will provide you more stability, so that your muscles do not tire as quickly. By keeping your knees relaxed, or slightly bent, you will also reduce stress across your lower back. CORRECT LIFTING TECHNIQUES  DO :  Assume a wide stance. This will  provide you more stability and the opportunity to get as close as possible to the object which you are lifting.  Tense your abdominals to brace your spine. Bend at the knees and hips. Keeping your back locked in a neutral-spine position, lift using your leg muscles. Lift with your legs, keeping your back straight.  Test the weight of unknown objects before attempting to lift them.  Try to keep your elbows locked down at your sides in order get the best strength from your shoulders when carrying an object.     Always ask for help when lifting heavy or awkward objects. INCORRECT LIFTING TECHNIQUES DO NOT:   Lock your knees when lifting, even if it is a small object.  Bend and twist. Pivot at your feet or move your feet when needing to change directions.  Assume that you can safely pick up even a paperclip without proper posture.

## 2020-05-20 ENCOUNTER — Other Ambulatory Visit: Payer: Self-pay | Admitting: Family Medicine

## 2020-05-20 MED ORDER — ROSUVASTATIN CALCIUM 20 MG PO TABS: 20.0000 mg | ORAL_TABLET | Freq: Every day | ORAL | 3 refills | Status: DC

## 2020-05-21 ENCOUNTER — Other Ambulatory Visit: Payer: Self-pay | Admitting: Family Medicine

## 2020-05-21 DIAGNOSIS — E781 Pure hyperglyceridemia: Secondary | ICD-10-CM

## 2020-06-22 ENCOUNTER — Other Ambulatory Visit: Payer: Self-pay

## 2020-06-22 ENCOUNTER — Other Ambulatory Visit: Payer: PRIVATE HEALTH INSURANCE

## 2020-06-22 ENCOUNTER — Other Ambulatory Visit (HOSPITAL_COMMUNITY)
Admission: RE | Admit: 2020-06-22 | Discharge: 2020-06-22 | Disposition: A | Discharge status: Home / Self Care | Payer: PRIVATE HEALTH INSURANCE | Source: Ambulatory Visit | Attending: Infectious Diseases | Admitting: Infectious Diseases

## 2020-06-22 DIAGNOSIS — Z113 Encounter for screening for infections with a predominantly sexual mode of transmission: Secondary | ICD-10-CM | POA: Diagnosis not present

## 2020-06-22 DIAGNOSIS — E781 Pure hyperglyceridemia: Secondary | ICD-10-CM

## 2020-06-22 DIAGNOSIS — B20 Human immunodeficiency virus [HIV] disease: Secondary | ICD-10-CM

## 2020-06-23 LAB — T-HELPER CELL (CD4) - (RCID CLINIC ONLY)
CD4 % Helper T Cell: 19 % — ABNORMAL LOW (ref 33–65)
CD4 T Cell Abs: 370 /uL — ABNORMAL LOW (ref 400–1790)

## 2020-06-23 LAB — URINE CYTOLOGY ANCILLARY ONLY
Chlamydia: NEGATIVE
Comment: NEGATIVE
Comment: NORMAL
Neisseria Gonorrhea: NEGATIVE

## 2020-06-24 LAB — COMPREHENSIVE METABOLIC PANEL
AG Ratio: 1.6 (calc) (ref 1.0–2.5)
ALT: 31 U/L (ref 9–46)
AST: 33 U/L (ref 10–35)
Albumin: 4.4 g/dL (ref 3.6–5.1)
Alkaline phosphatase (APISO): 43 U/L (ref 35–144)
BUN: 15 mg/dL (ref 7–25)
CO2: 29 mmol/L (ref 20–32)
Calcium: 10.1 mg/dL (ref 8.6–10.3)
Chloride: 103 mmol/L (ref 98–110)
Creat: 0.92 mg/dL (ref 0.70–1.33)
Globulin: 2.7 g/dL (calc) (ref 1.9–3.7)
Glucose, Bld: 105 mg/dL — ABNORMAL HIGH (ref 65–99)
Potassium: 4.6 mmol/L (ref 3.5–5.3)
Sodium: 137 mmol/L (ref 135–146)
Total Bilirubin: 0.4 mg/dL (ref 0.2–1.2)
Total Protein: 7.1 g/dL (ref 6.1–8.1)

## 2020-06-24 LAB — HIV-1 RNA QUANT-NO REFLEX-BLD
HIV 1 RNA Quant: NOT DETECTED Copies/mL
HIV-1 RNA Quant, Log: NOT DETECTED Log cps/mL

## 2020-06-24 LAB — RPR TITER: RPR Titer: 1:2 {titer} — ABNORMAL HIGH

## 2020-06-24 LAB — CBC
HCT: 46.5 % (ref 38.5–50.0)
Hemoglobin: 14.9 g/dL (ref 13.2–17.1)
MCH: 27.7 pg (ref 27.0–33.0)
MCHC: 32 g/dL (ref 32.0–36.0)
MCV: 86.4 fL (ref 80.0–100.0)
MPV: 8.8 fL (ref 7.5–12.5)
Platelets: 298 10*3/uL (ref 140–400)
RBC: 5.38 10*6/uL (ref 4.20–5.80)
RDW: 14.1 % (ref 11.0–15.0)
WBC: 6.7 10*3/uL (ref 3.8–10.8)

## 2020-06-24 LAB — FLUORESCENT TREPONEMAL AB(FTA)-IGG-BLD: Fluorescent Treponemal ABS: REACTIVE — AB

## 2020-06-24 LAB — RPR: RPR Ser Ql: REACTIVE — AB

## 2020-06-24 LAB — LIPID PANEL
Cholesterol: 123 mg/dL (ref <200)
HDL: 39 mg/dL — ABNORMAL LOW (ref >=40)
LDL Cholesterol (Calc): 68 mg/dL (calc)
Non-HDL Cholesterol (Calc): 84 mg/dL (calc) (ref <130)
Total CHOL/HDL Ratio: 3.2 (calc) (ref <5.0)
Triglycerides: 80 mg/dL (ref <150)

## 2020-07-06 ENCOUNTER — Ambulatory Visit: Payer: PRIVATE HEALTH INSURANCE | Admitting: Infectious Diseases

## 2020-07-06 ENCOUNTER — Encounter: Payer: Self-pay | Admitting: Infectious Diseases

## 2020-07-06 ENCOUNTER — Other Ambulatory Visit: Payer: Self-pay

## 2020-07-06 VITALS — BP 148/82 | HR 69 | Temp 98.4°F | Resp 16 | Ht 73.0 in | Wt 180.0 lb

## 2020-07-06 DIAGNOSIS — I10 Essential (primary) hypertension: Secondary | ICD-10-CM

## 2020-07-06 DIAGNOSIS — Z113 Encounter for screening for infections with a predominantly sexual mode of transmission: Secondary | ICD-10-CM

## 2020-07-06 DIAGNOSIS — B20 Human immunodeficiency virus [HIV] disease: Secondary | ICD-10-CM | POA: Diagnosis not present

## 2020-07-06 DIAGNOSIS — A539 Syphilis, unspecified: Secondary | ICD-10-CM | POA: Diagnosis not present

## 2020-07-06 DIAGNOSIS — J018 Other acute sinusitis: Secondary | ICD-10-CM | POA: Diagnosis not present

## 2020-07-06 DIAGNOSIS — Z79899 Other long term (current) drug therapy: Secondary | ICD-10-CM

## 2020-07-06 DIAGNOSIS — E781 Pure hyperglyceridemia: Secondary | ICD-10-CM

## 2020-07-06 MED ORDER — PENICILLIN G BENZATHINE 1200000 UNIT/2ML IM SUSP
2.4000 10*6.[IU] | Freq: Once | INTRAMUSCULAR | Status: AC
  Administered 2020-07-06: 2.4 10*6.[IU] via INTRAMUSCULAR

## 2020-07-06 NOTE — Assessment & Plan Note (Signed)
Appears to be doing well.  

## 2020-07-06 NOTE — Assessment & Plan Note (Signed)
No issues yet this spring. Will watch.

## 2020-07-06 NOTE — Assessment & Plan Note (Signed)
He is doing well on biktarvy.  U=U Will get shingles vax and colon Will see back in 9 months.

## 2020-07-06 NOTE — Assessment & Plan Note (Signed)
Continue on statin Appreciate PCP f/u.  LFTs normal.

## 2020-07-06 NOTE — Progress Notes (Signed)
   Subjective:    Patient ID: John Farrell, male  DOB: Sep 27, 1970, 50 y.o.        MRN: [id number]   HPI 50yo M with hx HIV+ 1999, mild hyperlipidemia, HTN, depression.  Married HIV- partner on prep. 04-2018 (atripla to biktarvy).   Has been running on treadmill. Exercising daily, walking in park.  Was started on statin by PCP, his Chol has come down (his Glc went up slightly).  New partner in [place] prior to lab testing, no new lesions. He has had prev positive RPR, treated.   HIV 1 RNA Quant  Date Value  06/22/2020 Not Detected Copies/mL  06/20/2019 <20 NOT DETECTED copies/mL  08/29/2018 <20 DETECTED copies/mL (A)   CD4 T Cell Abs (/uL)  Date Value  06/22/2020 370 (L)  06/20/2019 420  08/29/2018 455     Health Maintenance  Topic Date Due  . COLONOSCOPY (Pts 45-67yrs Insurance coverage will need to be confirmed)  Never done  . COVID-19 Vaccine (4 - Booster for Pfizer series) 09/11/2020  . INFLUENZA VACCINE  10/25/2020  . TETANUS/TDAP  08/17/2026  . Hepatitis C Screening  Completed  . HIV Screening  Completed  . HPV VACCINES  Aged Out    Review of Systems  Constitutional: Negative for chills and fever.  Respiratory: Negative for cough and shortness of breath.   Gastrointestinal: Negative for constipation and diarrhea.  Genitourinary: Negative for dysuria.   Please see HPI. All other systems reviewed and negative.     Objective:  Physical Exam Vitals reviewed.  Constitutional:      Appearance: Normal appearance.  HENT:     Mouth/Throat:     Mouth: Mucous membranes are moist.     Pharynx: No oropharyngeal exudate.  Eyes:     Extraocular Movements: Extraocular movements intact.     Pupils: Pupils are equal, round, and reactive to light.  Cardiovascular:     Rate and Rhythm: Normal rate and regular rhythm.  Pulmonary:     Effort: Pulmonary effort is normal.     Breath sounds: Normal breath sounds.  Abdominal:     General: Bowel sounds are normal. There  is no distension.     Palpations: Abdomen is soft.     Tenderness: There is no abdominal tenderness.  Musculoskeletal:        General: Normal range of motion.     Cervical back: Normal range of motion and neck supple.     Right lower leg: No edema.     Left lower leg: No edema.  Skin:    General: Skin is warm.  Neurological:     General: No focal deficit present.     Mental Status: He is alert and oriented to person, place, and time.  Psychiatric:        Mood and Affect: Mood normal.            Assessment & Plan:

## 2020-07-06 NOTE — Assessment & Plan Note (Addendum)
Will repeat his bicillin2.4 million units x 1 given he had new partner.

## 2020-08-11 ENCOUNTER — Other Ambulatory Visit: Payer: Self-pay | Admitting: Infectious Diseases

## 2020-08-11 DIAGNOSIS — E781 Pure hyperglyceridemia: Secondary | ICD-10-CM

## 2020-08-26 ENCOUNTER — Encounter: Payer: Self-pay | Admitting: *Deleted

## 2020-08-26 ENCOUNTER — Telehealth: Payer: Self-pay | Admitting: *Deleted

## 2020-08-26 DIAGNOSIS — U071 COVID-19: Secondary | ICD-10-CM

## 2020-08-26 NOTE — Telephone Encounter (Signed)
Reviewed RN Rolly Salter note agreed with plan of care will discuss oral antiviral use with patient tomorrow if no improvement in symptoms.

## 2020-08-26 NOTE — Telephone Encounter (Signed)
RN notified by HR that pt tested positive for Covid today 6/2 by home test.  Spoke with pt by phone and he reports mild sx of HA, temp of 99, slight dry cough he relates to PND, and nasal congestion that started last night 6/1.  Day 0 6/1 Day 5 6/6  RTW 6/7 with strict mask use thru 6/11.   Tylenol and nasal saline. Will likely start Flonase back as well.

## 2020-08-27 NOTE — Telephone Encounter (Signed)
Patient reported had recent travel to [place] before becoming sick.  Denied known sick contacts.  Patient did not develop symptoms of  trouble breathing, chest pain, nausea, vomiting, diarrhea, or fever or chills.  Has had post nasal drip, cough, congestion, headache and body aches.  Using over the counter medication to control symptoms.  Doesn't feel like when he has needed tessalon pearles, prednisone oral or inhaler in the past for bad bronchitis.  Feeling same or a little better from yesterday.  Spouse checked into hotel after he had positive home test.  Patient to notify me if throwing up after coughing, worsening cough, fever or new/worsening symptoms consider starting covid antivirals.   Patient to isolate in own room and if possible use only one bathroom if living with others in home.  Wear mask when out of room to help prevent spread to others in household.  Sanitize high touch surfaces with lysol/chlorox/bleach spray or wipes daily as viruses are known to live on surfaces from 24 hours - days.  Patient reported cough worst in the am from drip.  Continue use flonase nasal 1 spray each nostril BID prn rhinitis.  Patient states helping a lot.  Patient has used tessalon pearles in the past and  They helped but doesn't need at this time.  Honey 1 tablespoon every 4 hours is a natural cough suppressant.  Patient is using cough lozenges with good relief.  Avoid dehydration and drink water to keep urine pale yellow clear and voiding every 2-4 hours while awake.  Patient alert and oriented x3, spoke full sentences without difficulty.  Some nasal congestion noted.  No cough or throat clearing audible during 8 minute telephone call.  Discussed with patient he can contact me at this time number [phone] this weekend as clinic closed if questions or concerns until RN Rolly Salter returns to clinic on Monday [phone].  Discussed with patient I would follow up with him via telephone again tomorrow.   Pt verbalized  understanding and agreement with plan of care. No further questions/concerns at this time. Pt reminded to contact clinic with any changes in symptoms or questions/concerns. HR notified patient to work remote/quarantine through Day 5 and along with strict mask wear through Day 10 and no eating in employee lunch room.  Estimated return to work onsite 08/31/20

## 2020-08-29 NOTE — Telephone Encounter (Signed)
Patient contacted via telephone stated feeling much better.  Cough stopped and drainage has decreased considerably.  Was able to take walk outside today.  Plans to work remote tomorrow and feeling well to return onsite Tuesday with strict mask wear through day 10. HR notified of same.  Discussed RN Rolly Salter will contact patient tomorrow to ensure no worsening symptoms/vomiting/diarrhea/fever tomorrow prior to return onsite Tuesday 08/31/20. Patient A&Ox3 spoke full sentences without difficulty no nasal congestion/sniffing/throat clearing or cough noted during 3 minute telephone call.  Patient verbalized understanding information/instructions, agreed with plan of care and had no further questions at this time.

## 2020-08-30 NOTE — Telephone Encounter (Signed)
Patient contacted via telephone. Notified RN Rolly Salter unexpectedly out of clinic today/clinic closed and I was doing follow up calls today.  He stated last symptoms resolved today no more sinus problems or drainage.  Feels ready to return onsite tomorrow to work.  Patient cleared to return onsite, HR notified for 08/31/20 return onsite.  Patient verbalized understanding information/instructions, agreed with plan of care and had no further questions at this time.

## 2020-08-31 NOTE — Telephone Encounter (Signed)
Noted reviewed RN Rolly Salter note will try to reach patient tomorrow.

## 2020-08-31 NOTE — Telephone Encounter (Signed)
Called to confirm RTW. No answer, LVM.

## 2020-09-02 NOTE — Telephone Encounter (Signed)
Spoke with pt by phone. He reports all sx have resolved and he did RTW Tuesday 6/7 as expected. Strict mask use thru 6/11. Denies further questions or concerns.

## 2020-09-02 NOTE — Telephone Encounter (Signed)
Reviewed RN Rolly Salter note agreed with plan of care.  Patient returned to work as expected and symptoms resolved.

## 2020-09-05 NOTE — Telephone Encounter (Signed)
Patient contacted via telephone to follow up remains asymptomatic and feeling well Day 10 was 6/11 last day of strict mask use has passed.  Patient A&Ox3 spoke full sentences without difficulty and no questions or concerns.  Encounter closed.  Patient verbalized understanding information/instructions, agreed with plan of care and had no further questions at this time.

## 2020-09-08 ENCOUNTER — Other Ambulatory Visit: Payer: Self-pay | Admitting: Infectious Diseases

## 2020-09-08 DIAGNOSIS — B20 Human immunodeficiency virus [HIV] disease: Secondary | ICD-10-CM

## 2020-09-18 ENCOUNTER — Other Ambulatory Visit: Payer: Self-pay | Admitting: Infectious Diseases

## 2020-09-18 DIAGNOSIS — I10 Essential (primary) hypertension: Secondary | ICD-10-CM

## 2020-09-29 ENCOUNTER — Ambulatory Visit: Payer: No Typology Code available for payment source | Admitting: Family Medicine

## 2020-09-29 ENCOUNTER — Other Ambulatory Visit: Payer: Self-pay

## 2020-09-29 ENCOUNTER — Encounter: Payer: Self-pay | Admitting: Family Medicine

## 2020-09-29 VITALS — BP 132/82 | HR 75 | Temp 97.8°F | Ht 71.0 in | Wt 183.0 lb

## 2020-09-29 DIAGNOSIS — L570 Actinic keratosis: Secondary | ICD-10-CM | POA: Diagnosis not present

## 2020-09-29 DIAGNOSIS — L821 Other seborrheic keratosis: Secondary | ICD-10-CM

## 2020-09-29 NOTE — Patient Instructions (Signed)
Try to avoid sunlight in the next week or so.   Use sunscreen on your scalp.  Let me know if things are not turning the corner (perhaps with a picture if sent on MyChart) in the next month or so.   Let us know if you need anything.

## 2020-09-29 NOTE — Progress Notes (Addendum)
Chief Complaint  Patient presents with   Follow-up    John Farrell is a 50 y.o. male here for a skin complaint.  Duration: a few years Location: scalp Pruritic? Yes Painful? No Drainage? No New soaps/lotions/topicals/detergents? No Other associated symptoms: not spreading out; white and crusty Therapies tried thus far: Psoriasis shampoo  Past Medical History:  Diagnosis Date   DEPRESSION, MILD    GERD (gastroesophageal reflux disease)    HIV infection (HCC)    Hyperlipidemia    Hypertension    HYPERTRIGLYCERIDEMIA    Sinusitis    SYPHILIS    UNSPECIFIED NEURALGIA NEURITIS AND RADICULITIS     BP 132/82   Pulse 75   Temp 97.8 F (36.6 C) (Oral)   Ht 5\' 11"  (1.803 m)   Wt 183 lb (83 kg)   SpO2 96%   BMI 25.52 kg/m  Gen: awake, alert, appearing stated age Lungs: No accessory muscle use Skin: See below. No drainage, erythema, TTP, fluctuance, excoriation Psych: Age appropriate judgment and insight       Procedure note: cryotherapy Verbal consent obtained 11 skin lesions treated; 10 AK's, 1 SK.  Liquid nitrogen was applied via a thin spray creating an ice ball with 1-2 mm corona surrounding the lesion The patient tolerated the procedure well There were no immediate complications noted  Actinic keratoses - Plan: PR DESTRUC PREMAL,FIRST LESION, PR DESTRUC BENIGN/PREMAL,2-14 LESIONS  Seborrheic keratosis of scalp  If no better, will either biopsy or refer to derm.  F/u prn. The patient voiced understanding and agreement to the plan.  [place], DO 09/29/20 8:28 AM

## 2020-10-12 ENCOUNTER — Telehealth: Payer: Self-pay | Admitting: *Deleted

## 2020-10-12 ENCOUNTER — Encounter: Payer: Self-pay | Admitting: *Deleted

## 2020-10-12 DIAGNOSIS — R1110 Vomiting, unspecified: Secondary | ICD-10-CM

## 2020-10-12 DIAGNOSIS — R197 Diarrhea, unspecified: Secondary | ICD-10-CM

## 2020-10-12 NOTE — Telephone Encounter (Signed)
Reviewed RN Rolly Salter note agreed with plan of care.  Will follow up with patient tomorrow for re-evaluation via telephone.

## 2020-10-12 NOTE — Telephone Encounter (Signed)
HR notified clinic that pt called out of work today reporting stomach cramps and vomiting.  Spoke with pt by phone. He reports stomach cramping began about 0400 this morning. It then progressed to vomiting which stopped after a couple of episodes. Diarrhea then started but it is improving now. Still present but pt feels it is slowing down.  He reports that he made a flatbread last night and feels that the bread or a topping was bad as he and partner both ate it and partner had similar symptoms that started early this morning. Pt tolerating po fluids without difficulty. Advised to stay hydrated and keep urine clear to pale yellow. Advised pt that v/d has to be resolved for >24hr prior to RTW. Will f/u Wed 7/20 to ensure sx have stopped for a tentative RTW 7/21. Pt agreeable with this and denies any questions or concerns.

## 2020-10-13 ENCOUNTER — Telehealth: Payer: Self-pay | Admitting: Gastroenterology

## 2020-10-13 NOTE — Telephone Encounter (Signed)
I reviewed a 5 page packet of information.  Included is an office visit from a gastroenterologist in [facility] from 2014.  Also included was a colonoscopy report September 2014.  Indication colon cancer screening, family history of colon polyps, mother.  Colonoscopy to the cecum was performed.  Prep was "very good" there were no polyps, no cancers.  The examination was really completely normal.  Dr. Norma Fredrickson, the gastroenterologist, recommended a repeat colonoscopy in 5 years "because of family history"  Can you please reach out to the patient.  Explained to him that he actually does not need a colonoscopy at this point.  Family history of colon polyps is not an indication for 5-year follow-up.  Proper interval should be 10-year follow-up.  Please put him in recall system for colonoscopy September 2024.  If he has any questions or concerns about this recommendation please offer him my first available new GI appointment to discuss.

## 2020-10-13 NOTE — Telephone Encounter (Signed)
Patient contacted via telephone and reported had nausea prior to vomiting then nausea resolved.  Nausea started again prior to diarrhea.  Back to normal today except sore belly upper middle with passing stool.  Denied fever/chills/body aches/sore throat.  Yesterday evening patient did home covid test negative.  Tolerating po intake without difficulty today.  Voiding normally.  Patient notified cleared to return onsite tomorrow unless symptoms resume then stay home and use normal sick call out procedures.  HR notified.  Patient A&Ox3 spoke full sentences without difficulty no cough/congestion/throat clearing noted during 3 minute telephone call.  Patient verbalized understanding information/instructions, agreed with plan of care and had no further questions at this time.

## 2020-10-13 NOTE — Telephone Encounter (Signed)
Hey Dr. Christella Hartigan We have received rcd's from [facility] for review. According to the rcd's on 09/29/20 PM you were the DOD. We will send rcd's for review. Please advise on scheduling.

## 2020-10-14 NOTE — Telephone Encounter (Signed)
I was able to contact the patient and explained to him he didn't need a colonoscopy at this point and the colonoscopy should be a 10 year follow up. I did put in a recall for September 2024. Patient was very pleased.

## 2020-10-14 NOTE — Telephone Encounter (Signed)
Spoke with pt by phone. He confirms that he did RTW today. Feels well. Abd pain improving as well, almost resolved. Denies any needs or complaints.

## 2020-10-14 NOTE — Telephone Encounter (Signed)
Reviewed RN Rolly Salter note patient RTW as expected and symptoms improving.

## 2020-10-28 ENCOUNTER — Other Ambulatory Visit: Payer: Self-pay | Admitting: Family Medicine

## 2020-10-28 DIAGNOSIS — E781 Pure hyperglyceridemia: Secondary | ICD-10-CM

## 2020-10-31 DIAGNOSIS — E781 Pure hyperglyceridemia: Secondary | ICD-10-CM

## 2020-11-01 MED ORDER — FENOFIBRATE 145 MG PO TABS: 145.0000 mg | ORAL_TABLET | Freq: Every day | ORAL | 1 refills | Status: DC

## 2020-11-07 NOTE — Telephone Encounter (Signed)
Telephone message left for patient symptom follow up to ensure abdomen pain resolved

## 2020-11-11 NOTE — Telephone Encounter (Signed)
Message left for patient at work to verify if abdomen pain resolved

## 2020-11-17 NOTE — Telephone Encounter (Signed)
Patient left message he has been on vacation and all symptoms resolved feeling well.  No questions or concerns.

## 2020-12-16 ENCOUNTER — Telehealth: Payer: Self-pay | Admitting: Registered Nurse

## 2020-12-16 ENCOUNTER — Encounter: Payer: Self-pay | Admitting: Registered Nurse

## 2020-12-16 NOTE — Telephone Encounter (Signed)
Patient left message "I am scheduled to get my covid booster & flu vaccine through the company but my partner read that I needed to wait on the booster for 4 weeks after receiving the Jynneos Monkeypox because of the risk both vaccines pose for myocarditis. I received the first shot of MP vaccine on Sunday 9/18 and have to go back for the 2nd on 10/21.  I tried to verify online but it's not really clear. From what I can gather, it's advised to wait if you have never received a covid shot but you are OK if you have had one before and had no complications. Is that correct?"  Per up to date "Administration with other vaccines -- There are no studies evaluating co-administration of the MVA (Jynneos) vaccine with other vaccines. In general, the MVA vaccine may be administered without regards to timing of other vaccines since it is a nonreplicating virus vaccine. One exception may be delaying the COVID-19 vaccine in patients who recently (eg, in the last four weeks) received the MVA vaccine. This exception is based on a theoretical risk of myocarditis after a vaccinia virus vaccine. The ACAM2000 vaccine and the COVID-19 vaccine have both been associated with myocarditis. Although there is no evidence at this time that the MVA vaccine is associated with myocarditis, it is a vaccinia virus vaccine, and avoiding co-administration is reasonable pending more experience with this vaccine combination. However, if someone recently received the COVID-19 vaccine and then had an exposure to monkeypox, the MVA vaccine should not be delayed. (See "Treatment and prevention of monkeypox", section on 'Post-exposure management'.)"  Per Jynneos vaccine information there has been reported a 1.3% incidence of adverse cardiac events without co-administration of covid vaccine.  Per JAMA research article published in 2022 looking at mRNA covid vaccines less than 1 case myocarditis/pericarditis per million vaccine doses for males aged  3-64 was reported  to VAERS for 2020-2021 years.  VAERS is the [facility] database providers report vaccine adverse reactions to.  The myocarditis trend in young men was found due to this database.  Patient notified studies ongoing and current knowledge is to wait 4 weeks if there is not an urgent need due to monkeypox exposure after covid vaccination.  Since patient has received primary covid series and 2 boosters last administration May 2022 at lower risk for hospitalization and death but medium risk for covid complications due to age, hypertension, immunocompromised.  The monkeypox vaccines will have activated his immune system.    I recommend delaying for the 4 weeks after second dose to get new covid booster at this time.  He could receive his flu vaccination with the second Jynneos dose as previous research has shown flu vaccination offers some protection from covid if he is concerned about heading into fall season without covid booster.  I also recommend social distancing, hand sanitizer use and mask wear when indoors around crowds in general.

## 2020-12-30 ENCOUNTER — Other Ambulatory Visit: Payer: Self-pay | Admitting: Infectious Diseases

## 2020-12-30 DIAGNOSIS — B20 Human immunodeficiency virus [HIV] disease: Secondary | ICD-10-CM

## 2021-01-04 NOTE — Telephone Encounter (Signed)
Late entry patient left message "I can wait for the flu clinic since it's just next week. Should I wait for the email on the clinic to schedule?   As for the covid booster, [facility] had to cancel my Oct 21st monkeypox 2nd dose due to a shortage and will reschedule when they get more in. I'm concerned about having to wait to possibly mid December before getting the booster. October 16th will put me 4 weeks from the first dose (9/18). Would you recommend going ahead with the covid booster around that time or wait until after the 2nd shot?"  I attempted contact of patient and left message that [facility] the same day after his 4 weeks from initial Jynneos vaccination had passed since we do not have covid vaccination available onsite every day.  Discussed he may not feel well after the double vaccination for a day or two but other patients are reporting feeling fine and not missing any work days.  I wanted to let him know in case he wanted to schedule vaccinations when he had day off following administration.  If not administered together a 2 week separation between covid booster and influenza recommended.

## 2021-01-10 NOTE — Telephone Encounter (Signed)
Patient left message asking if he can get his flu vaccine today as was given his jynneos  on 15 Oct.  Having a little itchiness and redness at injection site today.  Planning to get covid omicron booster vaccination in 4 weeks after Jynneos.  Message left for patient and RN Rolly Salter per Up to Date  there are no studies evaluating co-administration of MVA vaccine with other vaccines.  In general, the MVA vaccine may be administreed without regarding to timing of others vaccines since it is a nonreplicating virus.  One exception may be delaying the Covid-19 vaccine in patients who in the last 4 weeks received MVA vaccine.  The exception is based on theoretical risk of myocarditis after a vaccinia virus.  He is cleared to obtain influenza vaccine today and omicron covid booster 12 Nov or later.

## 2021-01-20 NOTE — Telephone Encounter (Signed)
RN Rolly Salter verified patient received influenza and covid bivalent vaccination at employer vaccination clinic.

## 2021-01-28 ENCOUNTER — Other Ambulatory Visit: Payer: Self-pay | Admitting: Infectious Diseases

## 2021-01-28 DIAGNOSIS — B20 Human immunodeficiency virus [HIV] disease: Secondary | ICD-10-CM

## 2021-01-31 ENCOUNTER — Telehealth: Payer: Self-pay

## 2021-01-31 NOTE — Telephone Encounter (Signed)
Attempted to contact patient regarding Influenza and Bivalent vaccinations. Noted in his chart that he received these vaccinations at work. Left patient a voicemail to return call to verify immunization history. Valarie Cones

## 2021-01-31 NOTE — Telephone Encounter (Signed)
Patient returned call, RN updated his immunization record.    Sandie Ano, RN

## 2021-02-23 ENCOUNTER — Other Ambulatory Visit: Payer: Self-pay | Admitting: Family Medicine

## 2021-02-23 ENCOUNTER — Encounter: Payer: Self-pay | Admitting: Family Medicine

## 2021-02-23 MED ORDER — IMIQUIMOD 2.5 % EX CREA: TOPICAL_CREAM | CUTANEOUS | 1 refills | Status: DC

## 2021-03-09 ENCOUNTER — Encounter: Payer: Self-pay | Admitting: Family Medicine

## 2021-03-09 MED ORDER — IMIQUIMOD 2.5 % EX CREA: TOPICAL_CREAM | CUTANEOUS | 1 refills | Status: DC

## 2021-03-21 ENCOUNTER — Telehealth: Payer: Self-pay | Admitting: *Deleted

## 2021-03-21 DIAGNOSIS — J019 Acute sinusitis, unspecified: Secondary | ICD-10-CM

## 2021-03-21 NOTE — Telephone Encounter (Signed)
Pt called out of work today 12/26 due to head and chest congestion. Sx started morning of 12/24. Home covid test was negative that morning. Repeated test Sun evening 12/25, negative. Treating with Dayquil at home but has not taken in over 24hr now. Pt feels well enough to RTW tomorrow 12/27. Will repeat covid testing 48hr from first, this evening. If negative, will RTW tomorrow 12/27. If positive, will remain out of work and notify clinic.

## 2021-03-22 ENCOUNTER — Other Ambulatory Visit: Payer: Self-pay

## 2021-03-22 ENCOUNTER — Encounter: Payer: Self-pay | Admitting: *Deleted

## 2021-03-22 ENCOUNTER — Ambulatory Visit: Payer: Self-pay | Admitting: Registered Nurse

## 2021-03-22 VITALS — BP 152/86 | HR 74 | Temp 96.8°F

## 2021-03-22 DIAGNOSIS — J019 Acute sinusitis, unspecified: Secondary | ICD-10-CM

## 2021-03-22 DIAGNOSIS — H6983 Other specified disorders of Eustachian tube, bilateral: Secondary | ICD-10-CM

## 2021-03-22 DIAGNOSIS — H6993 Unspecified Eustachian tube disorder, bilateral: Secondary | ICD-10-CM

## 2021-03-22 MED ORDER — SALINE SPRAY 0.65 % NA SOLN: 2.0000 | NASAL | 0 refills | Status: DC

## 2021-03-22 MED ORDER — AMOXICILLIN-POT CLAVULANATE 875-125 MG PO TABS: 1.0000 | ORAL_TABLET | Freq: Two times a day (BID) | ORAL | 0 refills | Status: AC

## 2021-03-22 NOTE — Patient Instructions (Addendum)
Community-Acquired Pneumonia, Adult °Pneumonia is an infection of the lungs. It causes irritation and swelling in the airways of the lungs. Mucus and fluid Deva also build up inside the airways. This Liseth cause coughing and trouble breathing. °One type of pneumonia can happen while you are in a hospital. A different type can happen when you are not in a hospital (community-acquired pneumonia). °What are the causes? °This condition is caused by germs (viruses, bacteria, or fungi). Some types of germs can spread from person to person. Pneumonia is not thought to spread from person to person. °What increases the risk? °You are more likely to develop this condition if: °You have a long-term (chronic) disease, such as: °Disease of the lungs. This Angelis be chronic obstructive pulmonary disease (COPD) or asthma. °Heart failure. °Cystic fibrosis. °Diabetes. °Kidney disease. °Sickle cell disease. °HIV. °You have other health problems, such as: °Your body's defense system (immune system) is weak. °A condition that Summar cause you to breathe in fluids from your mouth and nose. °You had your spleen taken out. °You do not take good care of your teeth and mouth (poor dental hygiene). °You use or have used tobacco products. °You travel where the germs that cause this illness are common. °You are near certain animals or the places they live. °You are older than 51 years of age. °What are the signs or symptoms? °Symptoms of this condition include: °A cough. °A fever. °Sweating or chills. °Chest pain, often when you breathe deeply or cough. °Breathing problems, such as: °Fast breathing. °Trouble breathing. °Shortness of breath. °Feeling tired (fatigued). °Muscle aches. °How is this treated? °Treatment for this condition depends on many things, such as: °The cause of your illness. °Your medicines. °Your other health problems. °Most adults can be treated at home. Sometimes, treatment must happen in a hospital. °Treatment Alysen include  medicines to kill germs. °Medicines Montez depend on which germ caused your illness. °Very bad pneumonia is rare. If you get it, you Brilyn: °Have a machine to help you breathe. °Have fluid taken away from around your lungs. °Follow these instructions at home: °Medicines °Take over-the-counter and prescription medicines only as told by your doctor. °Take cough medicine only if you are losing sleep. Cough medicine can keep your body from taking mucus away from your lungs. °If you were prescribed an antibiotic medicine, take it as told by your doctor. Do not stop taking the antibiotic even if you start to feel better. °Lifestyle °  °Do not drink alcohol. °Do not use any products that contain nicotine or tobacco, such as cigarettes, e-cigarettes, and chewing tobacco. If you need help quitting, ask your doctor. °Eat a healthy diet. This includes a lot of vegetables, fruits, whole grains, low-fat dairy products, and low-fat (lean) protein. °General instructions ° °Rest a lot. Sleep for at least 8 hours each night. °Sleep with your head and neck raised. Put a few pillows under your head or sleep in a reclining chair. °Return to your normal activities as told by your doctor. Ask your doctor what activities are safe for you. °Drink enough fluid to keep your pee (urine) pale yellow. °If your throat is sore, rinse your mouth often with salt water. To make salt water, dissolve ½-1 tsp (3-6 g) of salt in 1 cup (237 mL) of warm water. °Keep all follow-up visits as told by your doctor. This is important. °How is this prevented? °You can lower your risk of pneumonia by: °Getting the pneumonia shot (vaccine). These shots have different   types and schedules. Ask your doctor what works best for you. Think about getting this shot if: You are older than 51 years of age. You are 37-57 years of age and: You are being treated for cancer. You have long-term lung disease. You have other problems that affect your body's defense system. Ask  your doctor if you have one of these. Getting your flu shot every year. Ask your doctor which type of shot is best for you. Going to the dentist as often as told. Washing your hands often with soap and water for at least 20 seconds. If you cannot use soap and water, use hand sanitizer. Contact a doctor if: You have a fever. You lose sleep because your cough medicine does not help. Get help right away if: You are short of breath and this gets worse. You have more chest pain. Your sickness gets worse. This is very serious if: You are an older adult. Your body's defense system is weak. You cough up blood. These symptoms may be an emergency. Do not wait to see if the symptoms will go away. Get medical help right away. Call your local emergency services (911 in the [place] Do not drive yourself to the hospital. Summary Pneumonia is an infection of the lungs. Community-acquired pneumonia affects people who have not been in the hospital. Certain germs can cause this infection. This condition may be treated with medicines that kill germs. For very bad pneumonia, you may need a hospital stay and treatment to help with breathing. This information is not intended to replace advice given to you by your health care provider. Make sure you discuss any questions you have with your health care provider. Document Revised: 12/24/2018 Document Reviewed: 12/24/2018 Else[employer] Patient Education  2022 [employer] Acute Bronchitis, Adult Acute bronchitis is sudden inflammation of the main airways (bronchi) that come off the windpipe (trachea) in the lungs. The swelling causes the airways to get smaller and make more mucus than normal. This can make it hard to breathe and can cause coughing or noisy breathing (wheezing). Acute bronchitis may last several weeks. The cough may last longer. Allergies, asthma, and exposure to smoke may make the condition worse. What are the causes? This condition can be caused by  germs and by substances that irritate the lungs, including: Cold and flu viruses. The most common cause of this condition is the virus that causes the common cold. Bacteria. This is less common. Breathing in substances that irritate the lungs, including: Smoke from cigarettes and other forms of tobacco. Dust and pollen. Fumes from household cleaning products, gases, or burned fuel. Indoor or outdoor air pollution. What increases the risk? The following factors may make you more likely to develop this condition: A weak body's defense system, also called the immune system. A condition that affects your lungs and breathing, such as asthma. What are the signs or symptoms? Common symptoms of this condition include: Coughing. This may bring up clear, yellow, or green mucus from your lungs (sputum). Wheezing. Runny or stuffy nose. Having too much mucus in your lungs (chest congestion). Shortness of breath. Aches and pains, including sore throat or chest. How is this diagnosed? This condition is usually diagnosed based on: Your symptoms and medical history. A physical exam. You may also have other tests, including tests to rule out other conditions, such as pneumonia. These tests include: A test of lung function. Test of a mucus sample to look for the presence of bacteria. Tests to check the  oxygen level in your blood. Blood tests. Chest X-ray. How is this treated? Most cases of acute bronchitis clear up over time without treatment. Your health care provider may recommend: Drinking more fluids to help thin your mucus so it is easier to cough up. Taking inhaled medicine (inhaler) to improve air flow in and out of your lungs. Using a vaporizer or a humidifier. These are machines that add water to the air to help you breathe better. Taking a medicine that thins mucus and clears congestion (expectorant). Taking a medicine that prevents or stops coughing (cough suppressant). It is notcommon  to take an antibiotic medicine for this condition. Follow these instructions at home:  Take over-the-counter and prescription medicines only as told by your health care provider. Use an inhaler, vaporizer, or humidifier as told by your health care provider. Take two teaspoons (10 mL) of honey at bedtime to lessen coughing at night. Drink enough fluid to keep your urine pale yellow. Do not use any products that contain nicotine or tobacco. These products include cigarettes, chewing tobacco, and vaping devices, such as e-cigarettes. If you need help quitting, ask your health care provider. Get plenty of rest. Return to your normal activities as told by your health care provider. Ask your health care provider what activities are safe for you. Keep all follow-up visits. This is important. How is this prevented? To lower your risk of getting this condition again: Wash your hands often with soap and water for at least 20 seconds. If soap and water are not available, use hand sanitizer. Avoid contact with people who have cold symptoms. Try not to touch your mouth, nose, or eyes with your hands. Avoid breathing in smoke or chemical fumes. Breathing smoke or chemical fumes will make your condition worse. Get the flu shot every year. Contact a health care provider if: Your symptoms do not improve after 2 weeks. You have trouble coughing up the mucus. Your cough keeps you awake at night. You have a fever. Get help right away if you: Cough up blood. Feel pain in your chest. Have severe shortness of breath. Faint or keep feeling like you are going to faint. Have a severe headache. Have a fever or chills that get worse. These symptoms may represent a serious problem that is an emergency. Do not wait to see if the symptoms will go away. Get medical help right away. Call your local emergency services (911 in the [place] Do not drive yourself to the hospital. Summary Acute bronchitis is inflammation of  the main airways (bronchi) that come off the windpipe (trachea) in the lungs. The swelling causes the airways to get smaller and make more mucus than normal. Drinking more fluids can help thin your mucus so it is easier to cough up. Take over-the-counter and prescription medicines only as told by your health care provider. Do not use any products that contain nicotine or tobacco. These products include cigarettes, chewing tobacco, and vaping devices, such as e-cigarettes. If you need help quitting, ask your health care provider. Contact a health care provider if your symptoms do not improve after 2 weeks. This information is not intended to replace advice given to you by your health care provider. Make sure you discuss any questions you have with your health care provider. Document Revised: 07/14/2020 Document Reviewed: 07/14/2020 Elsevier Patient Education  2022 [employer] Sinusitis, Adult Sinusitis is inflammation of your sinuses. Sinuses are hollow spaces in the bones around your face. Your sinuses are located: Around  your eyes. In the middle of your forehead. Behind your nose. In your cheekbones. Mucus normally drains out of your sinuses. When your nasal tissues become inflamed or swollen, mucus can become trapped or blocked. This allows bacteria, viruses, and fungi to grow, which leads to infection. Most infections of the sinuses are caused by a virus. Sinusitis can develop quickly. It can last for up to 4 weeks (acute) or for more than 12 weeks (chronic). Sinusitis often develops after a cold. What are the causes? This condition is caused by anything that creates swelling in the sinuses or stops mucus from draining. This includes: Allergies. Asthma. Infection from bacteria or viruses. Deformities or blockages in your nose or sinuses. Abnormal growths in the nose (nasal polyps). Pollutants, such as chemicals or irritants in the air. Infection from fungi (rare). What increases the  risk? You are more likely to develop this condition if you: Have a weak body defense system (immune system). Do a lot of swimming or diving. Overuse nasal sprays. Smoke. What are the signs or symptoms? The main symptoms of this condition are pain and a feeling of pressure around the affected sinuses. Other symptoms include: Stuffy nose or congestion. Thick drainage from your nose. Swelling and warmth over the affected sinuses. Headache. Upper toothache. A cough that may get worse at night. Extra mucus that collects in the throat or the back of the nose (postnasal drip). Decreased sense of smell and taste. Fatigue. A fever. Sore throat. Bad breath. How is this diagnosed? This condition is diagnosed based on: Your symptoms. Your medical history. A physical exam. Tests to find out if your condition is acute or chronic. This may include: Checking your nose for nasal polyps. Viewing your sinuses using a device that has a light (endoscope). Testing for allergies or bacteria. Imaging tests, such as an MRI or CT scan. In rare cases, a bone biopsy may be done to rule out more serious types of fungal sinus disease. How is this treated? Treatment for sinusitis depends on the cause and whether your condition is chronic or acute. If caused by a virus, your symptoms should go away on their own within 10 days. You may be given medicines to relieve symptoms. They include: Medicines that shrink swollen nasal passages (topical intranasal decongestants). Medicines that treat allergies (antihistamines). A spray that eases inflammation of the nostrils (topical intranasal corticosteroids). Rinses that help get rid of thick mucus in your nose (nasal saline washes). If caused by bacteria, your health care provider may recommend waiting to see if your symptoms improve. Most bacterial infections will get better without antibiotic medicine. You may be given antibiotics if you have: A severe infection. A  weak immune system. If caused by narrow nasal passages or nasal polyps, you may need to have surgery. Follow these instructions at home: Medicines Take, use, or apply over-the-counter and prescription medicines only as told by your health care provider. These may include nasal sprays. If you were prescribed an antibiotic medicine, take it as told by your health care provider. Do not stop taking the antibiotic even if you start to feel better. Hydrate and humidify  Drink enough fluid to keep your urine pale yellow. Staying hydrated will help to thin your mucus. Use a cool mist humidifier to keep the humidity level in your home above 50%. Inhale steam for 10-15 minutes, 3-4 times a day, or as told by your health care provider. You can do this in the bathroom while a hot shower is  running. Limit your exposure to cool or dry air. Rest Rest as much as possible. Sleep with your head raised (elevated). Make sure you get enough sleep each night. General instructions  Apply a warm, moist washcloth to your face 3-4 times a day or as told by your health care provider. This will help with discomfort. Wash your hands often with soap and water to reduce your exposure to germs. If soap and water are not available, use hand sanitizer. Do not smoke. Avoid being around people who are smoking (secondhand smoke). Keep all follow-up visits as told by your health care provider. This is important. Contact a health care provider if: You have a fever. Your symptoms get worse. Your symptoms do not improve within 10 days. Get help right away if: You have a severe headache. You have persistent vomiting. You have severe pain or swelling around your face or eyes. You have vision problems. You develop confusion. Your neck is stiff. You have trouble breathing. Summary Sinusitis is soreness and inflammation of your sinuses. Sinuses are hollow spaces in the bones around your face. This condition is caused by nasal  tissues that become inflamed or swollen. The swelling traps or blocks the flow of mucus. This allows bacteria, viruses, and fungi to grow, which leads to infection. If you were prescribed an antibiotic medicine, take it as told by your health care provider. Do not stop taking the antibiotic even if you start to feel better. Keep all follow-up visits as told by your health care provider. This is important. This information is not intended to replace advice given to you by your health care provider. Make sure you discuss any questions you have with your health care provider. Document Revised: 08/13/2017 Document Reviewed: 08/13/2017 Elsevier Patient Education  2022 [employer] How to Perform a Sinus Rinse A sinus rinse is a home treatment that is used to rinse your sinuses with a germ-free (sterile) mixture of salt and water (saline solution). Sinuses are air-filled spaces in your skull that are behind the bones of your face and forehead. They open into your nasal cavity. A sinus rinse can help to clear mucus, dirt, dust, or pollen from your nasal cavity. You may do a sinus rinse when you have a cold, a virus, nasal allergy symptoms, a sinus infection, or stuffiness in your nose or sinuses. What are the risks? A sinus rinse is generally safe and effective. However, there are a few risks, which include: A burning sensation in your sinuses. This may happen if you do not make the saline solution as directed. Be sure to follow all directions when making the saline solution. Nasal irritation. Infection. This may be from unclean supplies or from contaminated water. Infection from contaminated water is rare, but possible. Do not do a sinus rinse if you have had ear or nasal surgery, ear infection, or plugged ears, unless recommended by your health care provider. Supplies needed: Saline solution or powder. Distilled or sterile water to mix with saline powder. You may use boiled and cooled tap water. Boil  tap water for 5 minutes; cool until it is lukewarm. Use within 24 hours. Do not use regular tap water to mix with the saline solution. Neti pot or nasal rinse bottle. These supplies release the saline solution into your nose and through your sinuses. Neti pots and nasal rinse bottles can be purchased at [employer], a health food store, or online. How to perform a sinus rinse  Wash your hands with  soap and water for at least 20 seconds. If soap and water are not available, use hand sanitizer. Wash your device according to the directions that came with the product and then dry it. Use the solution that comes with your product or one that is sold separately in stores. Follow the mixing directions on the package to mix with sterile or distilled water. Fill the device with the amount of saline solution noted in the device instructions. Stand by a sink and tilt your head sideways over the sink. Place the spout of the device in your upper nostril (the one closer to the ceiling). Gently pour or squeeze the saline solution into your nasal cavity. The liquid should drain out from the lower nostril if you are not too congested. While rinsing, breathe through your open mouth. Gently blow your nose to clear any mucus and rinse solution. Blowing too hard may cause ear pain. Turn your head in the other direction and repeat in your other nostril. Clean and rinse your device with clean water and then air-dry it. Talk with your health care provider or pharmacist if you have questions about how to do a sinus rinse. Summary A sinus rinse is a home treatment that is used to rinse your sinuses with a sterile mixture of salt and water (saline solution). You may do a sinus rinse when you have a cold, a virus, nasal allergy symptoms, a sinus infection, or stuffiness in your nose or sinuses. A sinus rinse is generally safe and effective. Follow all instructions carefully. This information is not intended to  replace advice given to you by your health care provider. Make sure you discuss any questions you have with your health care provider. Document Revised: 08/30/2020 Document Reviewed: 08/30/2020 Elsevier Patient Education  2022 [employer] Eustachian Tube Dysfunction Eustachian tube dysfunction refers to a condition in which a blockage develops in the narrow passage that connects the middle ear to the back of the nose (eustachian tube). The eustachian tube regulates air pressure in the middle ear by letting air move between the ear and nose. It also helps to drain fluid from the middle ear space. Eustachian tube dysfunction can affect one or both ears. When the eustachian tube does not function properly, air pressure, fluid, or both can build up in the middle ear. What are the causes? This condition occurs when the eustachian tube becomes blocked or cannot open normally. Common causes of this condition include: Ear infections. Colds and other infections that affect the nose, mouth, and throat (upper respiratory tract). Allergies. Irritation from cigarette smoke. Irritation from stomach acid coming up into the esophagus (gastroesophageal reflux). The esophagus is the part of the body that moves food from the mouth to the stomach. Sudden changes in air pressure, such as from descending in an airplane or scuba diving. Abnormal growths in the nose or throat, such as: Growths that line the nose (nasal polyps). Abnormal growth of cells (tumors). Enlarged tissue at the back of the throat (adenoids). What increases the risk? You are more likely to develop this condition if: You smoke. You are overweight. You are a child who has: Certain birth defects of the mouth, such as cleft palate. Large tonsils or adenoids. What are the signs or symptoms? Common symptoms of this condition include: A feeling of fullness in the ear. Ear pain. Clicking or popping noises in the ear. Ringing in the ear  (tinnitus). Hearing loss. Loss of balance. Dizziness. Symptoms may get worse when the air  pressure around you changes, such as when you travel to an area of high elevation, fly on an airplane, or go scuba diving. How is this diagnosed? This condition may be diagnosed based on: Your symptoms. A physical exam of your ears, nose, and throat. Tests, such as those that measure: The movement of your eardrum. Your hearing (audiometry). How is this treated? Treatment depends on the cause and severity of your condition. In mild cases, you may relieve your symptoms by moving air into your ears. This is called "popping the ears." In more severe cases, or if you have symptoms of fluid in your ears, treatment may include: Medicines to relieve congestion (decongestants). Medicines that treat allergies (antihistamines). Nasal sprays or ear drops that contain medicines that reduce swelling (steroids). A procedure to drain the fluid in your eardrum. In this procedure, a small tube may be placed in the eardrum to: Drain the fluid. Restore the air in the middle ear space. A procedure to insert a balloon device through the nose to inflate the opening of the eustachian tube (balloon dilation). Follow these instructions at home: Lifestyle Do not do any of the following until your health care provider approves: Travel to high altitudes. Fly in airplanes. Work in a Estate agent or room. Scuba dive. Do not use any products that contain nicotine or tobacco. These products include cigarettes, chewing tobacco, and vaping devices, such as e-cigarettes. If you need help quitting, ask your health care provider. Keep your ears dry. Wear fitted earplugs during showering and bathing. Dry your ears completely after. General instructions Take over-the-counter and prescription medicines only as told by your health care provider. Use techniques to help pop your ears as recommended by your health care provider.  These may include: Chewing gum. Yawning. Frequent, forceful swallowing. Closing your mouth, holding your nose closed, and gently blowing as if you are trying to blow air out of your nose. Keep all follow-up visits. This is important. Contact a health care provider if: Your symptoms do not go away after treatment. Your symptoms come back after treatment. You are unable to pop your ears. You have: A fever. Pain in your ear. Pain in your head or neck. Fluid draining from your ear. Your hearing suddenly changes. You become very dizzy. You lose your balance. Get help right away if: You have a sudden, severe increase in any of your symptoms. Summary Eustachian tube dysfunction refers to a condition in which a blockage develops in the eustachian tube. It can be caused by ear infections, allergies, inhaled irritants, or abnormal growths in the nose or throat. Symptoms may include ear pain or fullness, hearing loss, or ringing in the ears. Mild cases are treated with techniques to unblock the ears, such as yawning or chewing gum. More severe cases are treated with medicines or procedures. This information is not intended to replace advice given to you by your health care provider. Make sure you discuss any questions you have with your health care provider. Document Revised: 05/24/2020 Document Reviewed: 05/24/2020 Elsevier Patient Education  2022 Elsevier Inc. Cough, Adult Coughing is a reflex that clears your throat and your airways (respiratory system). Coughing helps to heal and protect your lungs. It is normal to cough occasionally, but a cough that happens with other symptoms or lasts a long time may be a sign of a condition that needs treatment. An acute cough may only last 2-3 weeks, while a chronic cough may last 8 or more weeks. Coughing is commonly caused  by: Infection of the respiratory systemby viruses or bacteria. Breathing in substances that irritate your  lungs. Allergies. Asthma. Mucus that runs down the back of your throat (postnasal drip). Smoking. Acid backing up from the stomach into the esophagus (gastroesophageal reflux). Certain medicines. Chronic lung problems. Other medical conditions such as heart failure or a blood clot in the lung (pulmonary embolism). Follow these instructions at home: Medicines Take over-the-counter and prescription medicines only as told by your health care provider. Talk with your health care provider before you take a cough suppressant medicine. Lifestyle  Avoid cigarette smoke. Do not use any products that contain nicotine or tobacco, such as cigarettes, e-cigarettes, and chewing tobacco. If you need help quitting, ask your health care provider. Drink enough fluid to keep your urine pale yellow. Avoid caffeine. Do not drink alcohol if your health care provider tells you not to drink. General instructions  Pay close attention to changes in your cough. Tell your health care provider about them. Always cover your mouth when you cough. Avoid things that make you cough, such as perfume, candles, cleaning products, or campfire or tobacco smoke. If the air is dry, use a cool mist vaporizer or humidifier in your bedroom or your home to help loosen secretions. If your cough is worse at night, try to sleep in a semi-upright position. Rest as needed. Keep all follow-up visits as told by your health care provider. This is important. Contact a health care provider if you: Have new symptoms. Cough up pus. Have a cough that does not get better after 2-3 weeks or gets worse. Cannot control your cough with cough suppressant medicines and you are losing sleep. Have pain that gets worse or pain that is not helped with medicine. Have a fever. Have unexplained weight loss. Have night sweats. Get help right away if: You cough up blood. You have difficulty breathing. Your heartbeat is very fast. These symptoms may  represent a serious problem that is an emergency. Do not wait to see if the symptoms will go away. Get medical help right away. Call your local emergency services (911 in the [place] Do not drive yourself to the hospital. Summary Coughing is a reflex that clears your throat and your airways. It is normal to cough occasionally, but a cough that happens with other symptoms or lasts a long time may be a sign of a condition that needs treatment. Take over-the-counter and prescription medicines only as told by your health care provider. Always cover your mouth when you cough. Contact a health care provider if you have new symptoms or a cough that does not get better after 2-3 weeks or gets worse. This information is not intended to replace advice given to you by your health care provider. Make sure you discuss any questions you have with your health care provider. Document Revised: 04/01/2018 Document Reviewed: 04/01/2018 Elsevier Patient Education  2022 [employer].

## 2021-03-22 NOTE — Progress Notes (Signed)
Subjective:    Patient ID: John Farrell, male    DOB: February 11, 1971, 50 y.o.   MRN: [id number]  50y/o Caucasian established male pt c/o post nasal drip lingering after recent URI illness. Frequent throat clearing and sensation of mucus stuck in throat.   Using mucinex/dayquil/nyquil helped some but not resolving symptoms.  Patient symptoms started 03/19/21.  Home covid tests 03/20/21 and 03/22/21 negative.  No worsening of symptoms but mucous cloudy can't see through it yellow.  Denied vomiting after cough/wheezing/shortness of breath/fever/chills/headache/body aches/n/v/d.  Sinus pressure/pain intermittent.  Using flonase and nasal saline also.     Review of Systems  Constitutional:  Negative for activity change, appetite change, chills, diaphoresis, fatigue, fever and unexpected weight change.  HENT:  Positive for congestion, postnasal drip, rhinorrhea, sinus pressure and sinus pain. Negative for dental problem, drooling, ear discharge, ear pain, facial swelling, hearing loss, mouth sores, nosebleeds, sneezing, sore throat, tinnitus, trouble swallowing and voice change.   Eyes:  Negative for photophobia, pain, discharge, redness, itching and visual disturbance.  Respiratory:  Positive for cough. Negative for choking, chest tightness, shortness of breath, wheezing and stridor.   Cardiovascular:  Negative for chest pain, palpitations and leg swelling.  Gastrointestinal:  Negative for abdominal distention, abdominal pain, blood in stool, constipation, diarrhea, nausea and vomiting.  Endocrine: Negative for cold intolerance and heat intolerance.  Genitourinary:  Negative for dysuria.  Musculoskeletal:  Negative for arthralgias, back pain, gait problem, joint swelling, myalgias, neck pain and neck stiffness.  Skin:  Negative for color change, pallor, rash and wound.  Allergic/Immunologic: Positive for environmental allergies. Negative for food allergies and immunocompromised state.   Neurological:  Negative for dizziness, tremors, seizures, syncope, facial asymmetry, speech difficulty, weakness, light-headedness, numbness and headaches.  Hematological:  Negative for adenopathy. Does not bruise/bleed easily.  Psychiatric/Behavioral:  Negative for agitation, behavioral problems, confusion and sleep disturbance.       Objective:   Physical Exam Vitals and nursing note reviewed.  Constitutional:      General: He is awake. He is not in acute distress.    Appearance: Normal appearance. He is well-developed, well-groomed and overweight. He is not ill-appearing, toxic-appearing or diaphoretic.  HENT:     Head: Normocephalic and atraumatic.     Jaw: There is normal jaw occlusion. No trismus, tenderness, swelling or pain on movement.     Salivary Glands: Right salivary gland is not diffusely enlarged or tender. Left salivary gland is not diffusely enlarged or tender.     Right Ear: Hearing, ear canal and external ear normal. A middle ear effusion is present.     Left Ear: Hearing, ear canal and external ear normal. A middle ear effusion is present.     Ears:     Comments: Right TM air fluid level cloudy 90%  and left air fluid level clear no erythema TMs/canals bilaterally; bilateral allergic shiners; cobblestoning posterior pharynx; clear discharge bilateral nares; turbinates edema/erythema    Nose: Mucosal edema, congestion and rhinorrhea present. No nasal deformity, septal deviation or laceration. Rhinorrhea is clear.     Right Sinus: No maxillary sinus tenderness or frontal sinus tenderness.     Left Sinus: No maxillary sinus tenderness or frontal sinus tenderness.     Mouth/Throat:     Lips: Pink. No lesions.     Mouth: Mucous membranes are moist. Mucous membranes are not pale, not dry and not cyanotic. No lacerations, oral lesions or angioedema.     Dentition: No dental abscesses or  gum lesions.     Tongue: No lesions. Tongue does not deviate from midline.     Palate: No  mass and lesions.     Pharynx: Uvula midline. Pharyngeal swelling and posterior oropharyngeal erythema present. No oropharyngeal exudate or uvula swelling.     Tonsils: No tonsillar exudate or tonsillar abscesses. 0 on the right. 0 on the left.  Eyes:     General: Lids are normal. Vision grossly intact. Gaze aligned appropriately. Allergic shiner present. No scleral icterus.       Right eye: No foreign body, discharge or hordeolum.        Left eye: No foreign body, discharge or hordeolum.     Extraocular Movements: Extraocular movements intact.     Right eye: Normal extraocular motion and no nystagmus.     Left eye: Normal extraocular motion and no nystagmus.     Conjunctiva/sclera: Conjunctivae normal.     Right eye: Right conjunctiva is not injected. No chemosis, exudate or hemorrhage.    Left eye: Left conjunctiva is not injected. No chemosis, exudate or hemorrhage.    Pupils: Pupils are equal, round, and reactive to light. Pupils are equal.     Right eye: Pupil is round and reactive.     Left eye: Pupil is round and reactive.  Neck:     Thyroid: No thyroid mass or thyromegaly.     Trachea: Trachea and phonation normal. No tracheal tenderness or tracheal deviation.  Cardiovascular:     Rate and Rhythm: Normal rate and regular rhythm.     Pulses: Normal pulses.          Radial pulses are 2+ on the right side and 2+ on the left side.     Heart sounds: Normal heart sounds, S1 normal and S2 normal.  Pulmonary:     Effort: Pulmonary effort is normal. No respiratory distress.     Breath sounds: Normal breath sounds and air entry. No stridor, decreased air movement or transmitted upper airway sounds. No decreased breath sounds, wheezing, rhonchi or rales.     Comments: Spoke full sentences without difficulty; no cough observed in exam room; wearing cloth mask Abdominal:     General: There is no distension.     Palpations: Abdomen is soft.  Musculoskeletal:        General: No tenderness.  Normal range of motion.     Right elbow: No swelling, deformity or effusion. Normal range of motion.     Left elbow: No swelling, deformity or effusion. Normal range of motion.     Right hand: No swelling, deformity or lacerations. Normal range of motion. Normal strength.     Left hand: No swelling, deformity or lacerations. Normal range of motion. Normal strength.     Cervical back: Normal range of motion and neck supple. No swelling, edema, deformity, erythema, signs of trauma, lacerations, rigidity, torticollis or crepitus. No pain with movement, spinous process tenderness or muscular tenderness. Normal range of motion.  Lymphadenopathy:     Head:     Right side of head: No submental, submandibular, tonsillar, preauricular, posterior auricular or occipital adenopathy.     Left side of head: No submental, submandibular, tonsillar, preauricular, posterior auricular or occipital adenopathy.     Cervical: No cervical adenopathy.     Right cervical: No superficial, deep or posterior cervical adenopathy.    Left cervical: No superficial, deep or posterior cervical adenopathy.  Skin:    General: Skin is warm and dry.  Capillary Refill: Capillary refill takes less than 2 seconds.     Coloration: Skin is not ashen, cyanotic, jaundiced, mottled, pale or sallow.     Findings: No abrasion, abscess, acne, bruising, burn, ecchymosis, erythema, signs of injury, laceration, lesion, petechiae, rash or wound.     Nails: There is no clubbing.  Neurological:     General: No focal deficit present.     Mental Status: He is alert and oriented to person, place, and time. Mental status is at baseline.     GCS: GCS eye subscore is 4. GCS verbal subscore is 5. GCS motor subscore is 6.     Cranial Nerves: Cranial nerves 2-12 are intact. No cranial nerve deficit, dysarthria or facial asymmetry.     Sensory: Sensation is intact. No sensory deficit.     Motor: Motor function is intact. No weakness, tremor, atrophy,  abnormal muscle tone or seizure activity.     Coordination: Coordination is intact. Coordination normal.     Gait: Gait is intact. Gait normal.     Comments: Gait sure and steady in clinic; in/out of chair and on/off exam table without difficulty; bilateral hand grasp equal  Psychiatric:        Attention and Perception: Attention and perception normal.        Mood and Affect: Mood and affect normal.        Speech: Speech normal.        Behavior: Behavior normal. Behavior is cooperative.        Thought Content: Thought content normal.        Cognition and Memory: Cognition and memory normal.        Judgment: Judgment normal.          Assessment & Plan:  A-acute rhino sinusitis and bilateral eustachian tube dysfunction  P-Continue flonase 1 spray each nostril BID, saline 2 sprays each nostril q2h wa prn congestion.  If no improvement with 48 hours of saline and flonase use start augmentin 875mg  po BID x 10 days #20 RF0 dispensed from [facility] to patient  Denied personal or family history of ENT cancer.  Shower BID especially prior to bed. No evidence of systemic bacterial infection, non toxic and well hydrated.  I do not see where any further testing or imaging is necessary at this time.   I will suggest supportive care, rest, good hygiene and encourage the patient to take adequate fluids.  The patient is to return to clinic or EMERGENCY ROOM if symptoms worsen or change significantly.  Exitcare handout on sinusitis and sinus rinse printed and given to patient.  Patient verbalized agreement and understanding of treatment plan and had no further questions at this time.   P2:  Hand washing and cover cough   Cough lozenges po q2h prn cough.  May continue dayquil/nyquil per manufacturer instructions.  Discussed if get post nasal drip dried up bronchial irritation should resolve without further treatment. Bronchitis simple, community acquired, may have started as viral (probably respiratory syncytial,  parainfluenza, influenza, or adenovirus), but now evidence of acute purulent bronchitis with resultant bronchial edema and mucus formation.  Viruses are the most common cause of bronchial inflammation in otherwise healthy adults with acute bronchitis.  The appearance of sputum is not predictive of whether a bacterial infection is present.  Purulent sputum is most often caused by viral infections.  There are a small portion of those caused by non-viral agents being Mycoplama pneumonia.  Microscopic examination or C&S of sputum in the  healthy adult with acute bronchitis is generally not helpful (usually negative or normal respiratory flora) other considerations being cough from upper respiratory tract infections, sinusitis or allergic syndromes (mild asthma or viral pneumonia).  Differential Diagnoses:  reactive airway disease (asthma, allergic aspergillosis (eosinophilia), chronic bronchitis, respiratory infection (sinusitis, common cold, pneumonia), congestive heart failure, reflux esophagitis, bronchogenic tumor, aspiration syndromes and/or exposure to pulmonary irritants/smoke.  Without high fever, severe dyspnea, lack of physical findings or other risk factors, I will hold on a chest radiograph and CBC at this time.  I discussed that approximately 50% of patients with acute bronchitis have a cough that lasts up to three weeks, and 25% for over a month.  Tylenol  one to two tablets every four to six hours as needed for fever or myalgias.  No aspirin. Exitcare handout on bronchitis and community acquired pneumonia printed and given to patient.  Discussed no fluid or wheezing heard in lungs today.  ER if blue lips, hemoptysis, SOB, and/or worst chest pain of life.   Patient instructed to follow up in one week or sooner if symptoms worsen.  Patient verbalized agreement and understanding of treatment plan.  P2:  hand washing and cover cough    No evidence of invasive bacterial infection, non toxic and well  hydrated.  I do not see where any further testing or imaging is necessary at this time.   I will suggest supportive care, rest, good hygiene and encourage the patient to take adequate fluids.  The patient is to return to clinic or EMERGENCY ROOM if symptoms worsen or change significantly e.g. ear pain, fever, purulent discharge from ears or bleeding.  Exitcare handout on eustachian tube dysfunction printed and given to patient.  Discussed with patient post nasal drip irritates throat/causes swelling blocks eustachian tubes from draining and fluid fills up middle ear.  Bacteria/viruses can grow in fluid and with moving head tube compressed and increases pressure in tube/ear worsening pain.  Studies show will take 30 days for fluid to resolve after post nasal drip controlled with nasal steroid/antihistamine. Antibiotics and steroids do not speed up fluid removal.  Patient verbalized agreement and understanding of treatment plan and had no further questions at this time.

## 2021-03-22 NOTE — Telephone Encounter (Signed)
Reviewed RN Haley note agreed with plan of care. 

## 2021-03-22 NOTE — Telephone Encounter (Signed)
Patient seen in clinic today see office note 

## 2021-04-03 NOTE — Telephone Encounter (Signed)
Patient returned call and stated all symptoms have resolved.  Chest congestion/cough lasted the longest but finally resolved on Wednesday 03/30/2021.  Patient denied any concerns at this time. A&Ox3 spoke full sentences without difficulty; no audible cough/congestion/throat clearing during 2 minute telephone call.  Encounter closed.

## 2021-04-08 ENCOUNTER — Other Ambulatory Visit: Payer: No Typology Code available for payment source

## 2021-04-08 ENCOUNTER — Other Ambulatory Visit (HOSPITAL_COMMUNITY)
Admission: RE | Admit: 2021-04-08 | Discharge: 2021-04-08 | Disposition: A | Discharge status: Home / Self Care | Payer: No Typology Code available for payment source | Source: Ambulatory Visit | Attending: Infectious Diseases | Admitting: Infectious Diseases

## 2021-04-08 ENCOUNTER — Other Ambulatory Visit: Payer: Self-pay | Admitting: Infectious Diseases

## 2021-04-08 ENCOUNTER — Other Ambulatory Visit: Payer: Self-pay

## 2021-04-08 DIAGNOSIS — B20 Human immunodeficiency virus [HIV] disease: Secondary | ICD-10-CM

## 2021-04-08 DIAGNOSIS — Z113 Encounter for screening for infections with a predominantly sexual mode of transmission: Secondary | ICD-10-CM | POA: Insufficient documentation

## 2021-04-08 DIAGNOSIS — A539 Syphilis, unspecified: Secondary | ICD-10-CM

## 2021-04-08 DIAGNOSIS — E781 Pure hyperglyceridemia: Secondary | ICD-10-CM

## 2021-04-11 LAB — URINE CYTOLOGY ANCILLARY ONLY
Chlamydia: NEGATIVE
Comment: NEGATIVE
Comment: NORMAL
Neisseria Gonorrhea: NEGATIVE

## 2021-04-12 LAB — COMPREHENSIVE METABOLIC PANEL
AG Ratio: 1.6 (calc) (ref 1.0–2.5)
ALT: 38 U/L (ref 9–46)
AST: 43 U/L — ABNORMAL HIGH (ref 10–35)
Albumin: 4.4 g/dL (ref 3.6–5.1)
Alkaline phosphatase (APISO): 40 U/L (ref 35–144)
BUN: 18 mg/dL (ref 7–25)
CO2: 30 mmol/L (ref 20–32)
Calcium: 10.1 mg/dL (ref 8.6–10.3)
Chloride: 105 mmol/L (ref 98–110)
Creat: 1.13 mg/dL (ref 0.70–1.30)
Globulin: 2.8 g/dL (calc) (ref 1.9–3.7)
Glucose, Bld: 105 mg/dL — ABNORMAL HIGH (ref 65–99)
Potassium: 4.9 mmol/L (ref 3.5–5.3)
Sodium: 141 mmol/L (ref 135–146)
Total Bilirubin: 0.4 mg/dL (ref 0.2–1.2)
Total Protein: 7.2 g/dL (ref 6.1–8.1)

## 2021-04-12 LAB — RPR: RPR Ser Ql: REACTIVE — AB

## 2021-04-12 LAB — CBC
HCT: 46.8 % (ref 38.5–50.0)
Hemoglobin: 15.2 g/dL (ref 13.2–17.1)
MCH: 28.4 pg (ref 27.0–33.0)
MCHC: 32.5 g/dL (ref 32.0–36.0)
MCV: 87.3 fL (ref 80.0–100.0)
MPV: 9.1 fL (ref 7.5–12.5)
Platelets: 250 10*3/uL (ref 140–400)
RBC: 5.36 10*6/uL (ref 4.20–5.80)
RDW: 14.2 % (ref 11.0–15.0)
WBC: 5.9 10*3/uL (ref 3.8–10.8)

## 2021-04-12 LAB — LIPID PANEL
Cholesterol: 143 mg/dL (ref <200)
HDL: 35 mg/dL — ABNORMAL LOW (ref >=40)
LDL Cholesterol (Calc): 85 mg/dL (calc)
Non-HDL Cholesterol (Calc): 108 mg/dL (calc) (ref <130)
Total CHOL/HDL Ratio: 4.1 (calc) (ref <5.0)
Triglycerides: 124 mg/dL (ref <150)

## 2021-04-12 LAB — FLUORESCENT TREPONEMAL AB(FTA)-IGG-BLD: Fluorescent Treponemal ABS: REACTIVE — AB

## 2021-04-12 LAB — HIV-1 RNA QUANT-NO REFLEX-BLD
HIV 1 RNA Quant: NOT DETECTED Copies/mL
HIV-1 RNA Quant, Log: NOT DETECTED Log cps/mL

## 2021-04-12 LAB — RPR TITER: RPR Titer: 1:1 {titer} — ABNORMAL HIGH

## 2021-04-12 LAB — T-HELPER CELLS (CD4) COUNT (NOT AT ARMC)
Absolute CD4: 406 cells/uL — ABNORMAL LOW (ref 490–1740)
CD4 T Helper %: 15 % — ABNORMAL LOW (ref 30–61)
Total lymphocyte count: 2769 cells/uL (ref 850–3900)

## 2021-04-22 ENCOUNTER — Other Ambulatory Visit: Payer: Self-pay

## 2021-04-22 ENCOUNTER — Encounter: Payer: Self-pay | Admitting: Infectious Diseases

## 2021-04-22 ENCOUNTER — Ambulatory Visit: Payer: No Typology Code available for payment source | Admitting: Infectious Diseases

## 2021-04-22 VITALS — BP 149/83 | HR 82 | Temp 98.3°F | Wt 187.0 lb

## 2021-04-22 DIAGNOSIS — I10 Essential (primary) hypertension: Secondary | ICD-10-CM

## 2021-04-22 DIAGNOSIS — E781 Pure hyperglyceridemia: Secondary | ICD-10-CM | POA: Diagnosis not present

## 2021-04-22 DIAGNOSIS — Z113 Encounter for screening for infections with a predominantly sexual mode of transmission: Secondary | ICD-10-CM

## 2021-04-22 DIAGNOSIS — J018 Other acute sinusitis: Secondary | ICD-10-CM | POA: Diagnosis not present

## 2021-04-22 DIAGNOSIS — Z79899 Other long term (current) drug therapy: Secondary | ICD-10-CM

## 2021-04-22 DIAGNOSIS — B20 Human immunodeficiency virus [HIV] disease: Secondary | ICD-10-CM

## 2021-04-22 NOTE — Assessment & Plan Note (Signed)
Lab Results  Component Value Date   CHOL 143 04/08/2021   HDL 35 (L) 04/08/2021   LDLCALC 85 04/08/2021   LDLDIRECT 117.0 02/22/2017   TRIG 124 04/08/2021   CHOLHDL 4.1 04/08/2021    Much improved with statin, fibrate.  LFTs mostly normal.  Hepatic Function Latest Ref Rng & Units 04/08/2021 06/22/2020 05/19/2020  Total Protein 6.1 - 8.1 g/dL 7.2 7.1 7.8  Albumin 3.5 - 5.2 g/dL - - 4.5  AST 10 - 35 U/L 43(H) 33 29  ALT 9 - 46 U/L 38 31 27  Alk Phosphatase 39 - 117 U/L - - 42  Total Bilirubin 0.2 - 1.2 mg/dL 0.4 0.4 0.5

## 2021-04-22 NOTE — Progress Notes (Signed)
° °  Subjective:    Patient ID: STEPHEN BARUCH, male  DOB: 1971/03/19, 51 y.o.        MRN: [id number]   HPI 51 yo M with hx HIV+ 1999, mild hyperlipidemia, HTN, depression.   Married HIV- partner on prep. 04-2018 (atripla to biktar[employer]).    Has been running on treadmill. Exercising daily. Chol improved on statin.   Had sinusitis over christmas, has improved. Continued clear d/c. No headaches. He is off flonase, claritin (I don't like taking medicines).    HIV 1 RNA Quant  Date Value  04/08/2021 Not Detected Copies/mL  06/22/2020 Not Detected Copies/mL  06/20/2019 <20 NOT DETECTED copies/mL   CD4 T Cell Abs (/uL)  Date Value  06/22/2020 370 (L)  06/20/2019 420  08/29/2018 455     Health Maintenance  Topic Date Due   Zoster Vaccines- Shingrix (1 of 2) Never done   COLONOSCOPY (Pts 45-65yrs Insurance coverage will need to be confirmed)  Never done   COVID-19 Vaccine (4 - Booster for [employer] series) 05/08/2020   TETANUS/TDAP  08/17/2026   INFLUENZA VACCINE  Completed   Hepatitis C Screening  Completed   HIV Screening  Completed   HPV VACCINES  Aged Out      Review of Systems  Constitutional:  Negative for chills, fever and weight loss.  HENT:  Positive for congestion.   Respiratory:  Positive for cough. Negative for shortness of breath.   Gastrointestinal:  Negative for constipation and diarrhea.  Genitourinary:  Negative for dysuria.   Please see HPI. All other systems reviewed and negative.     Objective:  Physical Exam Vitals reviewed.  Constitutional:      Appearance: Normal appearance.  HENT:     Mouth/Throat:     Mouth: Mucous membranes are moist.     Pharynx: No oropharyngeal exudate.  Eyes:     Extraocular Movements: Extraocular movements intact.     Pupils: Pupils are equal, round, and reactive to light.  Cardiovascular:     Rate and Rhythm: Normal rate and regular rhythm.  Pulmonary:     Effort: Pulmonary effort is normal.     Breath  sounds: Normal breath sounds.  Abdominal:     General: Bowel sounds are normal. There is no distension.     Palpations: Abdomen is soft.     Tenderness: There is no abdominal tenderness.  Musculoskeletal:        General: Normal range of motion.     Cervical back: Normal range of motion and neck supple.     Right lower leg: No edema.     Left lower leg: No edema.  Neurological:     General: No focal deficit present.     Mental Status: He is alert.  Psychiatric:        Mood and Affect: Mood normal.          Assessment & Plan:

## 2021-04-22 NOTE — Assessment & Plan Note (Signed)
Encouraged him to resume flonase.

## 2021-04-22 NOTE — Assessment & Plan Note (Signed)
He is asx Will continue his anti-htn rx, appreciate his PCP f/u.

## 2021-04-22 NOTE — Assessment & Plan Note (Signed)
He is doing well, married No problems with art U=u.  Given rx for shingles vaccine  PCV 20 at 51 yo.  rtc 9 months.

## 2021-05-05 ENCOUNTER — Other Ambulatory Visit: Payer: Self-pay | Admitting: Family Medicine

## 2021-05-05 DIAGNOSIS — E781 Pure hyperglyceridemia: Secondary | ICD-10-CM

## 2021-05-12 ENCOUNTER — Other Ambulatory Visit: Payer: Self-pay

## 2021-05-12 ENCOUNTER — Ambulatory Visit: Payer: Self-pay | Admitting: *Deleted

## 2021-05-12 VITALS — BP 135/78 | HR 69

## 2021-05-12 DIAGNOSIS — Z0000 Encounter for general adult medical examination without abnormal findings: Secondary | ICD-10-CM

## 2021-05-12 NOTE — Progress Notes (Signed)
[employer] insurance premium discount evaluation: Labs Drawn at pcp 04/08/21. Repeated BP as elevated in their office but pt was just getting over sinus inf and had been taking sudafed and antihistamines. [employer]pl[employer] ROI form signed. Tobacco Free Attestation form signed.  Forms placed in paper chart.

## 2021-05-14 ENCOUNTER — Other Ambulatory Visit: Payer: Self-pay | Admitting: Family Medicine

## 2021-06-24 ENCOUNTER — Other Ambulatory Visit: Payer: Self-pay | Admitting: Infectious Diseases

## 2021-06-24 DIAGNOSIS — B20 Human immunodeficiency virus [HIV] disease: Secondary | ICD-10-CM

## 2021-06-29 ENCOUNTER — Encounter: Payer: Self-pay | Admitting: Family Medicine

## 2021-06-29 ENCOUNTER — Ambulatory Visit: Payer: No Typology Code available for payment source | Admitting: Family Medicine

## 2021-06-29 VITALS — BP 132/77 | HR 71 | Temp 98.5°F | Ht 71.0 in | Wt 189.5 lb

## 2021-06-29 DIAGNOSIS — K529 Noninfective gastroenteritis and colitis, unspecified: Secondary | ICD-10-CM

## 2021-06-29 MED ORDER — IMIQUIMOD 3.75 % EX CREA: TOPICAL_CREAM | CUTANEOUS | 2 refills | Status: DC

## 2021-06-29 NOTE — Progress Notes (Signed)
Chief Complaint  ?Patient presents with  ? digestive problems  ?  Frequent BM's  ? ? ?John Farrell is 51 y.o. male here for complaint of diarrhea. ? ?Duration: 8 days ?Abdominal pain? Yes; lower abd cramping ?Bleeding? No ?Recent travel? Yes; [place]  ?Recent antibiotic use? No ?Sick contacts? No ?Fevers? Yes initially, resolved within a day ?Therapies tried: none ?No N/V.  ? ?Past Medical History:  ?Diagnosis Date  ? DEPRESSION, MILD   ? GERD (gastroesophageal reflux disease)   ? HIV infection (HCC)   ? Hyperlipidemia   ? Hypertension   ? HYPERTRIGLYCERIDEMIA   ? Sinusitis   ? SYPHILIS   ? UNSPECIFIED NEURALGIA NEURITIS AND RADICULITIS   ? ? ?BP 132/77   Pulse 71   Temp 98.5 ?F (36.9 ?C) (Oral)   Ht 5\' 11"  (1.803 m)   Wt 189 lb 8 oz (86 kg)   SpO2 96%   BMI 26.43 kg/m?  ?Gen: awake, alert, appearing stated age ?HEENT: MMM ?Heart: RRR, no LE edema ?Lungs: CTAB, no accessory muscle use ?Abd: BS+, soft, TTP, non-distended, no masses or organomegaly ?Psych: Age appropriate judgment and insight ? ?Frequent stools ? ?He is no longer having loose stool/diarrhea.  No indication for stool sample or empiric treatment with an antibiotic.  Continue probiotic therapy.  Consider adding Metamucil/Benefiber.  Stay hydrated.  He will let me know if things change or worsen. ?Follow-up as originally scheduled. ?The patient voiced understanding and agreement to the plan. ? ? , DO ?06/29/21 ?4:20 PM ? ?

## 2021-06-29 NOTE — Patient Instructions (Signed)
Continue probiotics.  ? ?Take Metamucil or Benefiber daily. ? ?Stay hydrated.  ? ?Let us know if you need anything. ?

## 2021-07-07 ENCOUNTER — Telehealth: Payer: No Typology Code available for payment source | Admitting: Family Medicine

## 2021-07-07 ENCOUNTER — Encounter: Payer: Self-pay | Admitting: Family Medicine

## 2021-07-07 ENCOUNTER — Other Ambulatory Visit: Payer: Self-pay

## 2021-07-07 DIAGNOSIS — L821 Other seborrheic keratosis: Secondary | ICD-10-CM

## 2021-07-07 MED ORDER — IMIQUIMOD 5 % EX CREA: TOPICAL_CREAM | CUTANEOUS | 0 refills | Status: DC

## 2021-07-07 NOTE — Telephone Encounter (Signed)
Okay to change the formulary of the scalp treatment? ?

## 2021-07-07 NOTE — Addendum Note (Signed)
Addended by: Lisbeth Renshaw, Nataleigh Griffin HUA on: 07/07/2021 02:22 PM ? ? Modules accepted: Orders ? ?

## 2021-07-07 NOTE — Telephone Encounter (Signed)
[facility] pharmacist needs clarification on sig for imiquimod. Please advise.  ?

## 2021-07-07 NOTE — Telephone Encounter (Signed)
Called [facility] to inform them of the change ?

## 2021-07-07 NOTE — Telephone Encounter (Signed)
[facility] pharmacist stated they are out of stock for imiquimod and would like for it to be changed to imiquimod 5%. Please advise.  ?

## 2021-07-22 ENCOUNTER — Encounter: Payer: Self-pay | Admitting: Family Medicine

## 2021-08-02 ENCOUNTER — Encounter: Payer: Self-pay | Admitting: Family Medicine

## 2021-08-07 ENCOUNTER — Ambulatory Visit
Admission: EM | Admit: 2021-08-07 | Discharge: 2021-08-07 | Disposition: A | Discharge status: Home / Self Care | Payer: No Typology Code available for payment source | Attending: Urgent Care | Admitting: Urgent Care

## 2021-08-07 ENCOUNTER — Ambulatory Visit (HOSPITAL_BASED_OUTPATIENT_CLINIC_OR_DEPARTMENT_OTHER)
Admission: RE | Admit: 2021-08-07 | Discharge: 2021-08-07 | Disposition: A | Discharge status: Home / Self Care | Payer: No Typology Code available for payment source | Source: Ambulatory Visit | Attending: Urgent Care | Admitting: Urgent Care

## 2021-08-07 ENCOUNTER — Ambulatory Visit: Admit: 2021-08-07 | Payer: No Typology Code available for payment source | Source: Home / Self Care

## 2021-08-07 DIAGNOSIS — M898X1 Other specified disorders of bone, shoulder: Secondary | ICD-10-CM | POA: Diagnosis present

## 2021-08-07 DIAGNOSIS — M5414 Radiculopathy, thoracic region: Secondary | ICD-10-CM | POA: Diagnosis present

## 2021-08-07 DIAGNOSIS — G8929 Other chronic pain: Secondary | ICD-10-CM | POA: Diagnosis not present

## 2021-08-07 DIAGNOSIS — M5412 Radiculopathy, cervical region: Secondary | ICD-10-CM

## 2021-08-07 MED ORDER — TIZANIDINE HCL 4 MG PO TABS: 4.0000 mg | ORAL_TABLET | Freq: Every day | ORAL | 0 refills | Status: DC

## 2021-08-07 MED ORDER — PREDNISONE 50 MG PO TABS: 50.0000 mg | ORAL_TABLET | Freq: Every day | ORAL | 0 refills | Status: DC

## 2021-08-07 NOTE — ED Provider Notes (Signed)
?[facility] ? ? ?MRN: [id number] DOB: 04/01/1970 ? ?Subjective:  ? ?John Farrell is a 51 y.o. male presenting for 1 day history of acute onset left-sided scapular pain, lateral thoracic pain, left arm tingling and radicular symptoms extending into the first 3 fingertips.  No fall, trauma, weakness.  No history of musculoskeletal disorders.  Patient does not do shortness physical activities.  He noticed this when he was laying in bed and symptoms persisted.  He does have a history of left shoulder pain, neuralgia neuritis and radiculitis.  Has a history of HIV as well. ? ?No current facility-administered medications for this encounter. ? ?Current Outpatient Medications:  ?  BIKTARVY 50-200-25 MG TABS tablet, TAKE 1 TABLET BY MOUTH  DAILY, Disp: 30 tablet, Rfl: 5 ?  fenofibrate (TRICOR) 145 MG tablet, TAKE ONE TABLET BY MOUTH DAILY, Disp: 90 tablet, Rfl: 1 ?  fluticasone (FLONASE) 50 MCG/ACT nasal spray, Place 1 spray into both nostrils daily., Disp: , Rfl:  ?  imiquimod (ALDARA) 5 % cream, Apply twice daily on scalp for 2 weeks., Disp: 12 each, Rfl: 0 ?  lisinopril (ZESTRIL) 5 MG tablet, TAKE ONE TABLET BY MOUTH DAILY, Disp: 90 tablet, Rfl: 4 ?  rosuvastatin (CRESTOR) 20 MG tablet, TAKE ONE TABLET BY MOUTH DAILY, Disp: 90 tablet, Rfl: 3 ?  sodium chloride (OCEAN) 0.65 % SOLN nasal spray, Place 2 sprays into both nostrils every 2 (two) hours while awake., Disp: , Rfl: 0 ?  tadalafil (CIALIS) 20 MG tablet, Take 0.5-1 tablets (10-20 mg total) by mouth every other day as needed for erectile dysfunction., Disp: 30 tablet, Rfl: 2  ? ?No Known Allergies ? ?Past Medical History:  ?Diagnosis Date  ? DEPRESSION, MILD   ? GERD (gastroesophageal reflux disease)   ? HIV infection (HCC)   ? Hyperlipidemia   ? Hypertension   ? HYPERTRIGLYCERIDEMIA   ? Sinusitis   ? SYPHILIS   ? UNSPECIFIED NEURALGIA NEURITIS AND RADICULITIS   ?  ? ?Past Surgical History:  ?Procedure Laterality Date  ? CHOLECYSTECTOMY   2014  ? ? ?Family History  ?Problem Relation Age of Onset  ? Hypertension Mother   ? Colon polyps Mother   ? Hypertension Father   ? Heart disease Father   ? Heart attack Father   ? Hyperlipidemia Sister   ? Hypertension Sister   ? Diabetes Brother   ? Hyperlipidemia Brother   ? Hearing loss Maternal Grandmother   ? Heart disease Maternal Grandmother   ? Hyperlipidemia Maternal Grandmother   ? Hypertension Maternal Grandmother   ? Cancer Maternal Grandfather   ?     lung cancer  ? Heart attack Paternal Grandmother   ? Heart attack Paternal Grandfather   ? ? ?Social History  ? ?Tobacco Use  ? Smoking status: Former  ?  Packs/day: 0.50  ?  Types: Cigarettes  ?  Quit date: 09/30/2015  ?  Years since quitting: 5.8  ? Smokeless tobacco: Never  ? Tobacco comments:  ?  congratulated!!!  ?Substance Use Topics  ? Alcohol use: No  ?  Alcohol/week: 0.0 standard drinks  ?  Comment: rarely  ? Drug use: No  ? ? ?ROS ? ? ?Objective:  ? ?Vitals: ?BP (!) 149/82 (BP Location: Right Arm)   Pulse 70   Temp 98.1 ?F (36.7 ?C) (Oral)   Resp 20   SpO2 95%  ? ?Physical Exam ?Constitutional:   ?   General: He is not in acute  distress. ?   Appearance: Normal appearance. He is well-developed and normal weight. He is not ill-appearing, toxic-appearing or diaphoretic.  ?HENT:  ?   Head: Normocephalic and atraumatic.  ?   Right Ear: External ear normal.  ?   Left Ear: External ear normal.  ?   Nose: Nose normal.  ?   Mouth/Throat:  ?   Pharynx: Oropharynx is clear.  ?Eyes:  ?   General: No scleral icterus.    ?   Right eye: No discharge.     ?   Left eye: No discharge.  ?   Extraocular Movements: Extraocular movements intact.  ?Cardiovascular:  ?   Rate and Rhythm: Normal rate.  ?Pulmonary:  ?   Effort: Pulmonary effort is normal.  ?Musculoskeletal:  ?   Cervical back: Normal range of motion. No swelling, edema, deformity, erythema, signs of trauma, lacerations, rigidity, spasms, torticollis, tenderness, bony tenderness or crepitus. No pain  with movement. Normal range of motion.  ?   Thoracic back: Tenderness (over areas outlined) present. No swelling, edema, deformity, signs of trauma, lacerations, spasms or bony tenderness. Normal range of motion. No scoliosis.  ?     Back: ? ?Neurological:  ?   Mental Status: He is alert and oriented to person, place, and time.  ?Psychiatric:     ?   Mood and Affect: Mood normal.     ?   Behavior: Behavior normal.     ?   Thought Content: Thought content normal.     ?   Judgment: Judgment normal.  ? ? ?Assessment and Plan :  ? ?PDMP not reviewed this encounter. ? ?1. Pain of left scapula   ?2. Thoracic radiculopathy   ?3. Cervical radiculopathy   ?4. Chronic scapular pain   ? ?Will pursue outpatient imaging, x-ray order placed.  As patient has HIV and is on Biktarvy I will have to avoid NSAIDs.  Recommended a oral prednisone course which is more appropriate for him as well given his radicular symptoms and severity of his pain.  Use a muscle relaxant as well.  We will follow-up with x-ray results and any changes to the treatment plan as necessary.  Counseled patient on potential for adverse effects with medications prescribed/recommended today, ER and return-to-clinic precautions discussed, patient verbalized understanding. ? ?  ?Wallis Bamberg, PA-C ?08/07/21 1018 ? ?

## 2021-08-07 NOTE — ED Triage Notes (Signed)
Pt c/o left shoulder blade pain, patient states with certain movements the pain is worse.  ? ?Started: yesterday  ?

## 2021-08-08 ENCOUNTER — Encounter: Payer: Self-pay | Admitting: Family Medicine

## 2021-08-10 ENCOUNTER — Ambulatory Visit: Payer: No Typology Code available for payment source | Admitting: Family Medicine

## 2021-08-10 ENCOUNTER — Encounter: Payer: Self-pay | Admitting: Family Medicine

## 2021-08-10 VITALS — BP 124/72 | HR 85 | Temp 98.6°F | Ht 71.0 in | Wt 190.2 lb

## 2021-08-10 DIAGNOSIS — M5412 Radiculopathy, cervical region: Secondary | ICD-10-CM

## 2021-08-10 MED ORDER — GABAPENTIN 300 MG PO CAPS: 300.0000 mg | ORAL_CAPSULE | Freq: Three times a day (TID) | ORAL | 0 refills | Status: DC

## 2021-08-10 NOTE — Patient Instructions (Signed)
Heat (pad or rice pillow in microwave) over affected area, 10-15 minutes twice daily.  ? ?Ice/cold pack over area for 10-15 min twice daily. ? ?OK to take Tylenol 1000 mg (2 extra strength tabs) or 975 mg (3 regular strength tabs) every 6 hours as needed. ? ?The new medicine can make you drowsy. Don't take during the day if it does. ? ?If you do not hear anything about your referral in the next 1-2 weeks, call our office and ask for an update. ? ?Let us know if you need anything. ? ?EXERCISES ?RANGE OF MOTION (ROM) AND STRETCHING EXERCISES  ?These exercises may help you when beginning to rehabilitate your issue. In order to successfully resolve your symptoms, you must improve your posture. These exercises are designed to help reduce the forward-head and rounded-shoulder posture which contributes to this condition. Your symptoms may resolve with or without further involvement from your physician, physical therapist or athletic trainer. While completing these exercises, remember:  ?Restoring tissue flexibility helps normal motion to return to the joints. This allows healthier, less painful movement and activity. ?An effective stretch should be held for at least 20 seconds, although you may need to begin with shorter hold times for comfort. ?A stretch should never be painful. You should only feel a gentle lengthening or release in the stretched tissue. ?Do not do any stretch or exercise that you cannot tolerate. ? ?STRETCH- Axial Extensors ?Lie on your back on the floor. You may bend your knees for comfort. Place a rolled-up hand towel or dish towel, about 2 inches in diameter, under the part of your head that makes contact with the floor. ?Gently tuck your chin, as if trying to make a "double chin," until you feel a gentle stretch at the base of your head. ?Hold 15-20 seconds. ?Repeat 2-3 times. Complete this exercise 1 time per day.  ? ?STRETCH - Axial Extension  ?Stand or sit on a firm surface. Assume a good  posture: chest up, shoulders drawn back, abdominal muscles slightly tense, knees unlocked (if standing) and feet hip width apart. ?Slowly retract your chin so your head slides back and your chin slightly lowers. Continue to look straight ahead. ?You should feel a gentle stretch in the back of your head. Be certain not to feel an aggressive stretch since this can cause headaches later. ?Hold for 15-20 seconds. ?Repeat 2-3 times. Complete this exercise 1 time per day. ? ?STRETCH - Cervical Side Bend  ?Stand or sit on a firm surface. Assume a good posture: chest up, shoulders drawn back, abdominal muscles slightly tense, knees unlocked (if standing) and feet hip width apart. ?Without letting your nose or shoulders move, slowly tip your right / left ear to your shoulder until your feel a gentle stretch in the muscles on the opposite side of your neck. ?Hold 15-20 seconds. ?Repeat 2-3 times. Complete this exercise 1-2 times per day. ? ?STRETCH - Cervical Rotators  ?Stand or sit on a firm surface. Assume a good posture: chest up, shoulders drawn back, abdominal muscles slightly tense, knees unlocked (if standing) and feet hip width apart. ?Keeping your eyes level with the ground, slowly turn your head until you feel a gentle stretch along the back and opposite side of your neck. ?Hold 15-20 seconds. ?Repeat 2-3 times. Complete this exercise 1-2 times per day. ? ?RANGE OF MOTION - Neck Circles  ?Stand or sit on a firm surface. Assume a good posture: chest up, shoulders drawn back, abdominal muscles slightly tense,  knees unlocked (if standing) and feet hip width apart. ?Gently roll your head down and around from the back of one shoulder to the back of the other. The motion should never be forced or painful. ?Repeat the motion 10-20 times, or until you feel the neck muscles relax and loosen. ?Repeat 2-3 times. Complete the exercise 1-2 times per day. ?STRENGTHENING EXERCISES - Cervical Strain and Sprain ?These exercises may  help you when beginning to rehabilitate your injury. They may resolve your symptoms with or without further involvement from your physician, physical therapist, or athletic trainer. While completing these exercises, remember:  ?Muscles can gain both the endurance and the strength needed for everyday activities through controlled exercises. ?Complete these exercises as instructed by your physician, physical therapist, or athletic trainer. Progress the resistance and repetitions only as guided. ?You may experience muscle soreness or fatigue, but the pain or discomfort you are trying to eliminate should never worsen during these exercises. If this pain does worsen, stop and make certain you are following the directions exactly. If the pain is still present after adjustments, discontinue the exercise until you can discuss the trouble with your clinician. ? ?STRENGTH - Cervical Flexors, Isometric ?Face a wall, standing about 6 inches away. Place a small pillow, a ball about 6-8 inches in diameter, or a folded towel between your forehead and the wall. ?Slightly tuck your chin and gently push your forehead into the soft object. Push only with mild to moderate intensity, building up tension gradually. Keep your jaw and forehead relaxed. ?Hold 10 to 20 seconds. Keep your breathing relaxed. ?Release the tension slowly. Relax your neck muscles completely before you start the next repetition. ?Repeat 2-3 times. Complete this exercise 1 time per day. ? ?STRENGTH- Cervical Lateral Flexors, Isometric  ?Stand about 6 inches away from a wall. Place a small pillow, a ball about 6-8 inches in diameter, or a folded towel between the side of your head and the wall. ?Slightly tuck your chin and gently tilt your head into the soft object. Push only with mild to moderate intensity, building up tension gradually. Keep your jaw and forehead relaxed. ?Hold 10 to 20 seconds. Keep your breathing relaxed. ?Release the tension slowly. Relax your  neck muscles completely before you start the next repetition. ?Repeat 2-3 times. Complete this exercise 1 time per day. ? ?STRENGTH - Cervical Extensors, Isometric  ?Stand about 6 inches away from a wall. Place a small pillow, a ball about 6-8 inches in diameter, or a folded towel between the back of your head and the wall. ?Slightly tuck your chin and gently tilt your head back into the soft object. Push only with mild to moderate intensity, building up tension gradually. Keep your jaw and forehead relaxed. ?Hold 10 to 20 seconds. Keep your breathing relaxed. ?Release the tension slowly. Relax your neck muscles completely before you start the next repetition. ?Repeat 2-3 times. Complete this exercise 1 time per day. ? ?POSTURE AND BODY MECHANICS CONSIDERATIONS ?Keeping correct posture when sitting, standing or completing your activities will reduce the stress put on different body tissues, allowing injured tissues a chance to heal and limiting painful experiences. The following are general guidelines for improved posture. Your physician or physical therapist will provide you with any instructions specific to your needs. While reading these guidelines, remember: ?The exercises prescribed by your provider will help you have the flexibility and strength to maintain correct postures. ?The correct posture provides the optimal environment for your joints to work.  All of your joints have less wear and tear when properly supported by a spine with good posture. This means you will experience a healthier, less painful body. ?Correct posture must be practiced with all of your activities, especially prolonged sitting and standing. Correct posture is as important when doing repetitive low-stress activities (typing) as it is when doing a single heavy-load activity (lifting). ? ?PROLONGED STANDING WHILE SLIGHTLY LEANING FORWARD ?When completing a task that requires you to lean forward while standing in one place for a long time,  place either foot up on a stationary 2- to 4-inch high object to help maintain the best posture. When both feet are on the ground, the low back tends to lose its slight inward curve. If this curve flattens (

## 2021-08-10 NOTE — Progress Notes (Signed)
Musculoskeletal Exam ? ?Patient: John Farrell DOB: 1970/08/10 ? ?DOS: 08/10/2021 ? ?SUBJECTIVE: ? ?Chief Complaint:  ? ?Chief Complaint  ?Patient presents with  ? Arm Pain  ?  Left arm, forearm and hands.  Hand tingling and numb  ? ? ?John Farrell is a 51 y.o.  male for evaluation and treatment of side and forearm pain. He is left handed.  ? ?Onset:  4 d ago. No inj or change in activity.  ?Location: back of L forearm ?Character:   stretching sensation   ?Progression of issue:  has changed; pain improved but tingling ?Associated symptoms: tingling in 2nd/3rd digits on L hand ?Treatment: to date has been oral steroids and muscle relaxers.   ?Neurovascular symptoms: no ? ?Past Medical History:  ?Diagnosis Date  ? DEPRESSION, MILD   ? GERD (gastroesophageal reflux disease)   ? HIV infection (HCC)   ? Hyperlipidemia   ? Hypertension   ? HYPERTRIGLYCERIDEMIA   ? Sinusitis   ? SYPHILIS   ? UNSPECIFIED NEURALGIA NEURITIS AND RADICULITIS   ? ? ?Objective: ?VITAL SIGNS: BP 124/72   Pulse 85   Temp 98.6 ?F (37 ?C) (Oral)   Ht 5\' 11"  (1.803 m)   Wt 190 lb 4 oz (86.3 kg)   SpO2 96%   BMI 26.53 kg/m?  ?Constitutional: Well formed, well developed. No acute distress. ?Thorax & Lungs: No accessory muscle use ?Musculoskeletal: neck, L forearm.   ?Normal active range of motion: yes.   ?Normal passive range of motion: yes ?Tenderness to palpation: no ?Deformity: no ?Ecchymosis: no ?Tests positive: none ?Tests negative: Spurling's ?Neurologic: Normal sensory function. No focal deficits noted. DTR's equal and symmetric in UE's, 2/4 biceps b/l. No clonus. 5/5 strength in RUE and LUE w exception of 4/5 strength with L elbow extension, grip strength adequate.  Decreased sensation to palpation of the second and third digit on the left compared to the right hand. ?Psychiatric: Normal mood. Age appropriate judgment and insight. Alert & oriented x 3.   ? ?Assessment: ? ?Cervical radiculopathy - Plan: gabapentin (NEURONTIN) 300  MG capsule, Ambulatory referral to Orthopedic Surgery ? ?Plan: ?Stretches/exercises for the neck, gabapentin 300 mg 3 times daily, heat, ice, Tylenol.  Does have some foraminal stenosis on XR w paresthesias and some weakness. Warned about drowsiness w gabapentin.  ?F/u as originally scheduled. ?The patient voiced understanding and agreement to the plan. ? ? ? [place], DO ?08/10/21  ?12:20 PM ? ?

## 2021-08-11 ENCOUNTER — Ambulatory Visit: Payer: No Typology Code available for payment source | Admitting: Surgery

## 2021-08-11 DIAGNOSIS — M5412 Radiculopathy, cervical region: Secondary | ICD-10-CM | POA: Diagnosis not present

## 2021-08-11 NOTE — Progress Notes (Signed)
Office Visit Note   Patient: John Farrell           Date of Birth: 1970/04/30           MRN: [id number] Visit Date: 08/11/2021              Requested by: Sharlene Dory, DO [address] [place],  [place] [address] PCP: Sharlene Dory, DO   Assessment & Plan: Visit Diagnoses:  1. Radiculopathy, cervical region     Plan: This patient states that he is improving with completion of the prednisone taper this morning I would like to give him some more time.  Follow-up in 2 weeks for recheck but I asked patient to call me in 1 week to let me know how he is feeling.  If his symptoms are not completely resolved or worsen I will plan to get a cervical MRI to rule out HNP/stenosis.  If he is better and back to his norm he can cancel that appointment and follow-up as needed.  He is okay from the continue tizanidine that was previously given for spasms if needed.  I think it is reasonable for him to discontinue the gabapentin if it is causing too much sedation.  Follow-Up Instructions: Return in about 2 weeks (around 08/25/2021) for with Marrell Dicaprio recheck neck.   Orders:  No orders of the defined types were placed in this encounter.  No orders of the defined types were placed in this encounter.     Procedures: No procedures performed   Clinical Data: No additional findings.   Subjective: Chief Complaint  Patient presents with   Left Arm - Numbness   Left Hand - Numbness    HPI 51 year old white male who is new patient to clinic comes in with complaints of neck pain and left arm numbness and tingling.  Patient states that this past weekend he began having acute neck pain and left upper extremity radicular symptoms.  Denies any previous problems of this nature before onset.  He was seen at the [facility] urgent care Aug 07, 2021 and given oral prednisone x5 days.  He states that last dose was this morning.  Symptoms have been improving.  Some intermittent  soreness on his left forearm and numbness and tingling in his hand.  No right upper extremity symptoms.  He did have some feeling of arm weakness but this is also improved. Review of Systems No current cardiopulmonary GI/GU issues  Objective: Vital Signs: There were no vitals taken for this visit.  Physical Exam HENT:     Head: Normocephalic and atraumatic.     Nose: Nose normal.  Eyes:     Extraocular Movements: Extraocular movements intact.  Pulmonary:     Effort: No respiratory distress.  Musculoskeletal:     Comments: Pleasant male alert and oriented in no acute distress.  Server spine good range of motion.  No brachial plexus trapezius or scapular tenderness.  Bilateral shoulder exam unremarkable.  No focal motor deficits.  Neurological:     Mental Status: He is alert.  Psychiatric:        Mood and Affect: Mood normal.    Ortho Exam  Specialty Comments:  No specialty comments available.  Imaging: No results found.   PMFS History: Patient Active Problem List   Diagnosis Date Noted   Hip flexor tendinitis, right 08/23/2017   Erectile disorder due to medical condition in male 08/14/2017   HIV infection (HCC)  GERD (gastroesophageal reflux disease) 02/02/2016   Left shoulder pain 12/04/2012   History of cholecystectomy 05/29/2012   Abdominal pain 11/29/2011   Sinusitis 12/26/2010   UNSPECIFIED NEURALGIA NEURITIS AND RADICULITIS 06/17/2009   DEPRESSION, MILD 02/26/2007   Hypertension 10/29/2006   Hypertriglyceridemia 06/04/2006   Human immunodeficiency virus (HIV) disease (HCC) 05/29/2006   SYPHILIS 05/29/2006   Past Medical History:  Diagnosis Date   DEPRESSION, MILD    GERD (gastroesophageal reflux disease)    HIV infection (HCC)    Hyperlipidemia    Hypertension    HYPERTRIGLYCERIDEMIA    Sinusitis    SYPHILIS    UNSPECIFIED NEURALGIA NEURITIS AND RADICULITIS     Family History  Problem Relation Age of Onset   Hypertension Mother    Colon polyps  Mother    Hypertension Father    Heart disease Father    Heart attack Father    Hyperlipidemia Sister    Hypertension Sister    Diabetes Brother    Hyperlipidemia Brother    Hearing loss Maternal Grandmother    Heart disease Maternal Grandmother    Hyperlipidemia Maternal Grandmother    Hypertension Maternal Grandmother    Cancer Maternal Grandfather        lung cancer   Heart attack Paternal Grandmother    Heart attack Paternal Grandfather     Past Surgical History:  Procedure Laterality Date   CHOLECYSTECTOMY  2014   Social History   Occupational History   Not on file  Tobacco Use   Smoking status: Former    Packs/day: 0.50    Types: Cigarettes    Quit date: 09/30/2015    Years since quitting: 5.8   Smokeless tobacco: Never   Tobacco comments:    congratulated!!!  Substance and Sexual Activity   Alcohol use: No    Alcohol/week: 0.0 standard drinks    Comment: rarely   Drug use: No   Sexual activity: Yes    Partners: Male    Comment: pt. declined condoms

## 2021-08-25 DIAGNOSIS — M5412 Radiculopathy, cervical region: Secondary | ICD-10-CM

## 2021-08-28 ENCOUNTER — Encounter: Payer: Self-pay | Admitting: Infectious Diseases

## 2021-08-29 NOTE — Telephone Encounter (Signed)
Pt called again. He would like someone to call him back

## 2021-09-05 ENCOUNTER — Other Ambulatory Visit: Payer: Self-pay | Admitting: Family Medicine

## 2021-09-05 DIAGNOSIS — M5412 Radiculopathy, cervical region: Secondary | ICD-10-CM

## 2021-09-08 ENCOUNTER — Encounter: Payer: Self-pay | Admitting: Infectious Diseases

## 2021-09-09 ENCOUNTER — Other Ambulatory Visit: Payer: Self-pay

## 2021-09-09 ENCOUNTER — Ambulatory Visit
Admission: RE | Admit: 2021-09-09 | Discharge: 2021-09-09 | Disposition: A | Discharge status: Home / Self Care | Payer: No Typology Code available for payment source | Source: Ambulatory Visit | Attending: Surgery | Admitting: Surgery

## 2021-09-09 DIAGNOSIS — B20 Human immunodeficiency virus [HIV] disease: Secondary | ICD-10-CM

## 2021-09-09 DIAGNOSIS — M5412 Radiculopathy, cervical region: Secondary | ICD-10-CM

## 2021-09-09 MED ORDER — BIKTARVY 50-200-25 MG PO TABS: 1.0000 | ORAL_TABLET | Freq: Every day | ORAL | 1 refills | Status: DC

## 2021-09-20 ENCOUNTER — Other Ambulatory Visit: Payer: Self-pay | Admitting: Infectious Diseases

## 2021-09-20 DIAGNOSIS — I10 Essential (primary) hypertension: Secondary | ICD-10-CM

## 2021-10-03 ENCOUNTER — Encounter: Payer: Self-pay | Admitting: Family Medicine

## 2021-10-04 ENCOUNTER — Other Ambulatory Visit: Payer: Self-pay | Admitting: Family Medicine

## 2021-10-04 ENCOUNTER — Encounter: Payer: Self-pay | Admitting: Orthopaedic Surgery

## 2021-10-04 ENCOUNTER — Ambulatory Visit: Payer: No Typology Code available for payment source | Admitting: Orthopaedic Surgery

## 2021-10-04 DIAGNOSIS — M5020 Other cervical disc displacement, unspecified cervical region: Secondary | ICD-10-CM | POA: Diagnosis not present

## 2021-10-04 MED ORDER — TADALAFIL 20 MG PO TABS: 10.0000 mg | ORAL_TABLET | ORAL | 2 refills | Status: DC | PRN

## 2021-10-04 NOTE — Progress Notes (Signed)
Office Visit Note   Patient: John Farrell           Date of Birth: [birth year]           MRN: [id number] Visit Date: 10/04/2021              Requested by: Sharlene Dory, DO [address] [place],  [place] [address] PCP: Sharlene Dory, DO   Assessment & Plan: Visit Diagnoses:  1. Protrusion of cervical intervertebral disc           With left C7 radiculopathy  Plan: Patient has some foraminal stenosis prominent disc protrusion C5-6 C6-7 with left triceps weakness.  Pain is improved since his onset 2 months ago.  Recheck him in 8 weeks.  If he develops progressive weakness we discussed surgical intervention for his radicular symptoms.  Follow-Up Instructions: Return in about 8 weeks (around 11/29/2021).   Orders:  No orders of the defined types were placed in this encounter.  No orders of the defined types were placed in this encounter.     Procedures: No procedures performed   Clinical Data: No additional findings.   Subjective: Chief Complaint  Patient presents with   Neck - Pain, Follow-up    HPI 51 year old male with ongoing neck and left arm pain numbness and tingling.  Plain radiograph showed foraminal stenosis with spondylosis C5-6 C6-7.  He has been treated with prednisone Dosepak muscle relaxants other anti-inflammatories.  Onset was 2 months ago.  Currently symptoms are improved.  His numbness was in his index and long finger he states neck feels better.  MRI scan has been obtained and is available for review.  No gait disturbance.  Review of Systems positive for HIV on antiviral.  Positive hypertension previous gallbladder surgery.   Objective: Vital Signs: BP (!) 146/78   Pulse 70   Ht 5\' 11"  (1.803 m)   Wt 190 lb (86.2 kg)   BMI 26.50 kg/m   Physical Exam Constitutional:      Appearance: He is well-developed.  HENT:     Head: Normocephalic and atraumatic.     Right Ear: External ear normal.     Left Ear:  External ear normal.  Eyes:     Pupils: Pupils are equal, round, and reactive to light.  Neck:     Thyroid: No thyromegaly.     Trachea: No tracheal deviation.  Cardiovascular:     Rate and Rhythm: Normal rate.  Pulmonary:     Effort: Pulmonary effort is normal.     Breath sounds: No wheezing.  Abdominal:     General: Bowel sounds are normal.     Palpations: Abdomen is soft.  Musculoskeletal:     Cervical back: Neck supple.  Skin:    General: Skin is warm and dry.     Capillary Refill: Capillary refill takes less than 2 seconds.  Neurological:     Mental Status: He is alert and oriented to person, place, and time.  Psychiatric:        Behavior: Behavior normal.        Thought Content: Thought content normal.        Judgment: Judgment normal.     Ortho Exam patient has some left triceps weakness.  Biceps triceps reflexes are normal with normal biceps strength.  No wrist flexion weakness interossei are strong.  Triceps opposite right side is normal.  Specialty Comments:  No specialty comments available.  Imaging: No results found.  Narrative & Impression  CLINICAL DATA:  Initial evaluation for numbness in left fingers, tension and left forearm, left scapular discomfort.   EXAM: MRI CERVICAL SPINE WITHOUT CONTRAST   TECHNIQUE: Multiplanar, multisequence MR imaging of the cervical spine was performed. No intravenous contrast was administered.   COMPARISON:  Radiograph from 08/07/2021.   FINDINGS: Alignment: Straightening of the normal cervical lordosis. No listhesis.   Vertebrae: Vertebral body height maintained without acute or chronic fracture. Bone marrow signal intensity within normal limits. No worrisome osseous lesions. Mild discogenic reactive endplate change present about the C5-6 interspace. No other abnormal marrow edema.   Cord: Normal signal morphology.   Posterior Fossa, vertebral arteries, paraspinal tissues: Unremarkable.   Disc levels:    C2-C3: Right-sided uncovertebral spurring without significant disc bulge. Mild left-sided facet hypertrophy. No stenosis.   C3-C4: Mild disc bulge with uncovertebral spurring. No spinal stenosis. Foramina remain patent.   C4-C5: Mild disc bulge with bilateral uncovertebral spurring, slightly eccentric to the left. Flattening of the ventral thecal sac without significant spinal stenosis. Mild left C5 foraminal narrowing. Right neural foramen remains patent.   C5-C6: Moderate degenerative intervertebral disc space narrowing. Broad-based left paracentral disc protrusion flattens and effaces the ventral thecal sac (series 6, image 19). Secondary cord flattening without cord signal changes. Moderate spinal stenosis. Left worse than right uncovertebral spurring with resultant severe left with moderate right C6 foraminal narrowing.   C6-C7: Broad-based left paracentral to foraminal disc protrusion (series 7, image 23). Flattening of the thecal sac with minimal cord flattening, but no cord signal changes. Mild to moderate spinal stenosis with severe left C7 foraminal narrowing. Right neural foramen remains patent.   C7-T1:  Unremarkable.   Visualized upper thoracic spine demonstrates no significant finding.   IMPRESSION: 1. Broad-based left paracentral to foraminal disc protrusion at C6-7 with resultant mild to moderate spinal stenosis, with severe left C7 foraminal narrowing. 2. Broad-based left paracentral to foraminal disc protrusion at C5-6 with resultant moderate spinal stenosis, with severe left and moderate right C6 foraminal narrowing. 3. Mild left eccentric disc bulge with uncovertebral spurring at C4-5 with resultant mild left C5 foraminal stenosis.     Electronically Signed   By: Rise Mu M.D.   On: 09/10/2021 06:43    CLINICAL DATA:  Scapular pain and thoracic radiculopathy.   EXAM: CERVICAL SPINE - COMPLETE 4+ VIEW   COMPARISON:  None Available.    FINDINGS: No fracture or bone lesion. Straightened cervical lordosis. No spondylolisthesis.   Moderate loss of disc height with endplate spurring at C5-C6. Remaining cervical disc spaces are well preserved.   Moderate neural foraminal narrowing bilaterally at C5-C6 due to uncovertebral spurring.   Soft tissues are unremarkable.   IMPRESSION: 1. No fracture or spondylolisthesis. Straightened cervical lordosis. 2. Disc degenerative changes at C5-C6 with moderate bilateral neural foraminal narrowing.     Electronically Signed   By: Amie Portland M.D.   On: 08/07/2021 11:09  PMFS History: Patient Active Problem List   Diagnosis Date Noted   Protrusion of cervical intervertebral disc 10/12/2021   Hip flexor tendinitis, right 08/23/2017   Erectile disorder due to medical condition in male 08/14/2017   HIV infection (HCC)    GERD (gastroesophageal reflux disease) 02/02/2016   Left shoulder pain 12/04/2012   History of cholecystectomy 05/29/2012   Abdominal pain 11/29/2011   Sinusitis 12/26/2010   UNSPECIFIED NEURALGIA NEURITIS AND RADICULITIS 06/17/2009   DEPRESSION, MILD 02/26/2007   Hypertension 10/29/2006   Hypertriglyceridemia 06/04/2006  Human immunodeficiency virus (HIV) disease (HCC) 05/29/2006   SYPHILIS 05/29/2006   Past Medical History:  Diagnosis Date   DEPRESSION, MILD    GERD (gastroesophageal reflux disease)    HIV infection (HCC)    Hyperlipidemia    Hypertension    HYPERTRIGLYCERIDEMIA    Sinusitis    SYPHILIS    UNSPECIFIED NEURALGIA NEURITIS AND RADICULITIS     Family History  Problem Relation Age of Onset   Hypertension Mother    Colon polyps Mother    Hypertension Father    Heart disease Father    Heart attack Father    Hyperlipidemia Sister    Hypertension Sister    Diabetes Brother    Hyperlipidemia Brother    Hearing loss Maternal Grandmother    Heart disease Maternal Grandmother    Hyperlipidemia Maternal Grandmother     Hypertension Maternal Grandmother    Cancer Maternal Grandfather        lung cancer   Heart attack Paternal Grandmother    Heart attack Paternal Grandfather     Past Surgical History:  Procedure Laterality Date   CHOLECYSTECTOMY  2014   Social History   Occupational History   Not on file  Tobacco Use   Smoking status: Former    Packs/day: 0.50    Types: Cigarettes    Quit date: 09/30/2015    Years since quitting: 6.0   Smokeless tobacco: Never   Tobacco comments:    congratulated!!!  Substance and Sexual Activity   Alcohol use: No    Alcohol/week: 0.0 standard drinks of alcohol    Comment: rarely   Drug use: No   Sexual activity: Yes    Partners: Male    Comment: pt. declined condoms

## 2021-10-07 ENCOUNTER — Other Ambulatory Visit: Payer: Self-pay | Admitting: Family Medicine

## 2021-10-07 DIAGNOSIS — M5412 Radiculopathy, cervical region: Secondary | ICD-10-CM

## 2021-10-12 DIAGNOSIS — M5020 Other cervical disc displacement, unspecified cervical region: Secondary | ICD-10-CM | POA: Insufficient documentation

## 2021-10-20 ENCOUNTER — Other Ambulatory Visit: Payer: Self-pay | Admitting: Family Medicine

## 2021-10-20 MED ORDER — TADALAFIL 20 MG PO TABS
10.0000 mg | ORAL_TABLET | ORAL | 1 refills | Status: DC | PRN
Start: 2021-10-20 — End: 2023-02-28

## 2021-10-27 ENCOUNTER — Encounter: Payer: Self-pay | Admitting: Family Medicine

## 2021-10-27 DIAGNOSIS — E781 Pure hyperglyceridemia: Secondary | ICD-10-CM

## 2021-10-28 MED ORDER — FENOFIBRATE 145 MG PO TABS: 145.0000 mg | ORAL_TABLET | Freq: Every day | ORAL | 1 refills | Status: DC

## 2021-12-02 ENCOUNTER — Ambulatory Visit: Payer: No Typology Code available for payment source | Admitting: Orthopaedic Surgery

## 2021-12-25 ENCOUNTER — Encounter: Payer: Self-pay | Admitting: Infectious Diseases

## 2021-12-25 DIAGNOSIS — B20 Human immunodeficiency virus [HIV] disease: Secondary | ICD-10-CM

## 2021-12-26 ENCOUNTER — Encounter: Payer: Self-pay | Admitting: Family Medicine

## 2021-12-26 NOTE — Telephone Encounter (Signed)
Patient sent message via my chart to schedule an appointment with Dr. Johnnye Sima for the end of this month.

## 2021-12-27 ENCOUNTER — Other Ambulatory Visit (HOSPITAL_COMMUNITY): Payer: Self-pay

## 2021-12-27 MED ORDER — BIKTARVY 50-200-25 MG PO TABS: 1.0000 | ORAL_TABLET | Freq: Every day | ORAL | 1 refills | Status: DC

## 2022-01-09 ENCOUNTER — Encounter: Payer: Self-pay | Admitting: Infectious Diseases

## 2022-01-09 NOTE — Addendum Note (Signed)
Addended by: Truddie Crumble on: 01/09/2022 04:39 PM   Modules accepted: Orders

## 2022-01-10 ENCOUNTER — Other Ambulatory Visit (HOSPITAL_COMMUNITY)
Admission: RE | Admit: 2022-01-10 | Discharge: 2022-01-10 | Disposition: A | Discharge status: Home / Self Care | Payer: No Typology Code available for payment source | Source: Ambulatory Visit | Attending: Infectious Diseases | Admitting: Infectious Diseases

## 2022-01-10 ENCOUNTER — Other Ambulatory Visit (INDEPENDENT_AMBULATORY_CARE_PROVIDER_SITE_OTHER): Payer: No Typology Code available for payment source

## 2022-01-10 DIAGNOSIS — E781 Pure hyperglyceridemia: Secondary | ICD-10-CM

## 2022-01-10 DIAGNOSIS — Z113 Encounter for screening for infections with a predominantly sexual mode of transmission: Secondary | ICD-10-CM

## 2022-01-10 DIAGNOSIS — B20 Human immunodeficiency virus [HIV] disease: Secondary | ICD-10-CM

## 2022-01-10 DIAGNOSIS — Z79899 Other long term (current) drug therapy: Secondary | ICD-10-CM

## 2022-01-10 LAB — T-HELPER CELL (CD4) - (RCID CLINIC ONLY)
CD4 % Helper T Cell: 16 % — ABNORMAL LOW (ref 33–65)
CD4 T Cell Abs: 393 /uL — ABNORMAL LOW (ref 400–1790)

## 2022-01-11 LAB — URINE CYTOLOGY ANCILLARY ONLY
Chlamydia: NEGATIVE
Comment: NEGATIVE
Comment: NORMAL
Neisseria Gonorrhea: NEGATIVE

## 2022-01-12 LAB — LIPID PANEL
Chol/HDL Ratio: 5.1 ratio — ABNORMAL HIGH (ref 0.0–5.0)
Cholesterol, Total: 143 mg/dL (ref 100–199)
HDL: 28 mg/dL — ABNORMAL LOW (ref >39)
LDL Chol Calc (NIH): 91 mg/dL (ref 0–99)
Triglycerides: 135 mg/dL (ref 0–149)
VLDL Cholesterol Cal: 24 mg/dL (ref 5–40)

## 2022-01-12 LAB — COMPREHENSIVE METABOLIC PANEL
ALT: 42 IU/L (ref 0–44)
AST: 47 IU/L — ABNORMAL HIGH (ref 0–40)
Albumin/Globulin Ratio: 1.6 (ref 1.2–2.2)
Albumin: 4.6 g/dL (ref 3.8–4.9)
Alkaline Phosphatase: 48 IU/L (ref 44–121)
BUN/Creatinine Ratio: 16 (ref 9–20)
BUN: 15 mg/dL (ref 6–24)
Bilirubin Total: 0.5 mg/dL (ref 0.0–1.2)
CO2: 24 mmol/L (ref 20–29)
Calcium: 10 mg/dL (ref 8.7–10.2)
Chloride: 98 mmol/L (ref 96–106)
Creatinine, Ser: 0.94 mg/dL (ref 0.76–1.27)
Globulin, Total: 2.9 g/dL (ref 1.5–4.5)
Glucose: 111 mg/dL — ABNORMAL HIGH (ref 70–99)
Potassium: 4.7 mmol/L (ref 3.5–5.2)
Sodium: 135 mmol/L (ref 134–144)
Total Protein: 7.5 g/dL (ref 6.0–8.5)
eGFR: 98 mL/min/{1.73_m2} (ref >59)

## 2022-01-12 LAB — CBC
Hematocrit: 47.3 % (ref 37.5–51.0)
Hemoglobin: 15.8 g/dL (ref 13.0–17.7)
MCH: 28.6 pg (ref 26.6–33.0)
MCHC: 33.4 g/dL (ref 31.5–35.7)
MCV: 86 fL (ref 79–97)
Platelets: 264 10*3/uL (ref 150–450)
RBC: 5.52 x10E6/uL (ref 4.14–5.80)
RDW: 14.2 % (ref 11.6–15.4)
WBC: 5.5 10*3/uL (ref 3.4–10.8)

## 2022-01-12 LAB — HIV-1 RNA QUANT-NO REFLEX-BLD: HIV-1 RNA Viral Load: <20 copies/mL

## 2022-01-12 LAB — RPR: RPR Ser Ql: NONREACTIVE

## 2022-01-18 ENCOUNTER — Telehealth: Payer: Self-pay | Admitting: Registered Nurse

## 2022-01-18 DIAGNOSIS — U071 COVID-19: Secondary | ICD-10-CM

## 2022-01-18 MED ORDER — MOLNUPIRAVIR EUA 200MG CAPSULE: 4.0000 | ORAL_CAPSULE | Freq: Two times a day (BID) | ORAL | 0 refills | Status: AC

## 2022-01-18 NOTE — Telephone Encounter (Signed)
Patient left message positive home covid test this am.  Day 0 01/18/22.  Discussed partner should test in 6 days sooner if symptomatic and wear mask when around others/isolate x 10 days.  Per CDC recommendations. Day 1 of quarantine was 01/19/22  Day 5 10/30 estimated RTW 01/24/22 with strict mask wear and no eating in employee lunch room through 4 Nov.  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  He is to coordinate with supervisor if he feels well enough to work from home.  HR team replacements notified of quarantine dates estimated RTW and received test results/confirmed positive. Reviewed possible Covid sx including cough, shortness of breath with exertion or at rest, runny nose, congestion, sinus pain/pressure, sore throat, fever/chills, body aches, fatigue, loss of taste/smell, GI symptoms of nausea/vomiting/diarrhea.   isolate in own room and if possible use only one bathroom if living with others in home.  Wear mask when out of room to help prevent spread to others in household.  Patient should also wear mask when around others for the next 10 days after exposure.    Discussed to sanitize high touch surfaces like countertops, faucet handles, door/cabinet handles, microwave handle/controls and refrigerator handles daily with lysol, bleach or chlorox wipes/spray.  Discussed patient may email [email or web address]  HR notified of quarantine and positive test photo received/confirmed.  Discussed ER precautions with patient e.g. if develops blue face/lips, trouble breathing, worsening chest pain, confusion or passing out proceed to ER/call 911.  Sent covid self care handouts and FDA molnupiravir information to my chart and patient email.    According to research patient should have 90 days immunity to reinfection but more research ongoing.  I still recommend wearing mask, maintaining social distance and washing hands frequently. I recommendation covid vaccination in 60-90 days.  Electronic Rx  molnupiravir 200mg  take 4 tabs po BID x 5 days #40 RF0 sent to his pharmacy of choice [facility]. Most common side effects GI upset, bad taste in mouth.  Discussed not recommended to father any children x 3 months after taking molnupiravir. Confirmed with staff in [place].  Discussed with patient I recommended he start sooner than later and prior to day 5 of symptoms.  Patient reported head cold symptoms/congestion/cough/headache at this time.  Denied fever/chills/n/v/d/loss of taste/smell/dyspnea/chest pain.  Congestion/headache stronger than previous episode of covid.  Using mucinex OTC cough and cold.  Does not want prescription cough suppressant at this time.  Discussed if wheezing/shortness of breath/protracted coughing to notify me.  Discussed honey 1 tablespoon every 4 hours natural cough suppressant.   Discussed hydrate with water as losing more fluids with runny nose/mouth breathing/cough.  Take naps as needed.  Eat regular meals or snacks if meal portion upsetting stomach.  Bland diet if stomach upset avoid large portions spicy, fried, dairy, meat.  Discussed Listerine/salt water gargles if sore throat to help decrease viral load in throat.  Nasal saline 2 sprays each nostril q2h prn congestion.  Patient verbalized understanding information/instructions, agreed with plan of care and had no further questions at this time.

## 2022-01-22 NOTE — Telephone Encounter (Signed)
Patient contacted via telephone and stated started to feel better 01/20/22 and yesterday 10/28 almost feeling normal and today feeling fine.  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  Thursday had headache/congestion 10/26 and felt better as day progressed.  Headache only Friday am resolved mid day 01/20/22.  Patient did not start molnupiravir.    Denied chest symptoms or fever.  Used mucinex until Friday.  Patient A&ox3 spoke full sentences without difficulty some audible congestion nasal but no throat clearing/cough.  Discussed with patient I would follow up with him Day 10 and if worsening symptoms this week he should follow up with PCM or [employer] staff.  Patient verbalized understanding information/instructions, agreed with plan of care and had no further questions at this time.  Day 4 today.  Discussed may RTW with mask 01/24/22.

## 2022-01-24 ENCOUNTER — Ambulatory Visit (INDEPENDENT_AMBULATORY_CARE_PROVIDER_SITE_OTHER): Payer: No Typology Code available for payment source | Admitting: Infectious Diseases

## 2022-01-24 ENCOUNTER — Encounter: Payer: Self-pay | Admitting: Infectious Diseases

## 2022-01-24 ENCOUNTER — Other Ambulatory Visit: Payer: Self-pay

## 2022-01-24 VITALS — BP 140/89 | HR 79 | Temp 98.1°F | Ht 71.0 in | Wt 190.8 lb

## 2022-01-24 DIAGNOSIS — B20 Human immunodeficiency virus [HIV] disease: Secondary | ICD-10-CM

## 2022-01-24 DIAGNOSIS — N521 Erectile dysfunction due to diseases classified elsewhere: Secondary | ICD-10-CM

## 2022-01-24 DIAGNOSIS — Z113 Encounter for screening for infections with a predominantly sexual mode of transmission: Secondary | ICD-10-CM

## 2022-01-24 DIAGNOSIS — E781 Pure hyperglyceridemia: Secondary | ICD-10-CM | POA: Diagnosis not present

## 2022-01-24 DIAGNOSIS — I10 Essential (primary) hypertension: Secondary | ICD-10-CM

## 2022-01-24 DIAGNOSIS — Z79899 Other long term (current) drug therapy: Secondary | ICD-10-CM

## 2022-01-24 DIAGNOSIS — Z87891 Personal history of nicotine dependence: Secondary | ICD-10-CM

## 2022-01-24 DIAGNOSIS — M5020 Other cervical disc displacement, unspecified cervical region: Secondary | ICD-10-CM | POA: Diagnosis not present

## 2022-01-24 NOTE — Assessment & Plan Note (Signed)
Refill cialis prn

## 2022-01-24 NOTE — Assessment & Plan Note (Signed)
Appreciate Dr Lorin Mercy eval He is still considering.

## 2022-01-24 NOTE — Assessment & Plan Note (Addendum)
Mostly well controlled today asx Encouraged wt loss and exercise.  Appreciate PCP f/u.

## 2022-01-24 NOTE — Assessment & Plan Note (Signed)
He is doing well on statin Mild increase in AST Continue to watch.

## 2022-01-24 NOTE — Progress Notes (Signed)
Subjective:    Patient ID: John Farrell, male  DOB: February 18, 1971, 51 y.o.        MRN: [id number]   HPI 51 yo M with hx HIV+ 1999, mild hyperlipidemia, HTN, depression.   Married HIV- partner on prep. 04-2018 (atripla --> biktarvy).    On statin for hyperlipidemia. 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 He is to have 8 week f/u appt, discussion of surgical options.   He had COVID 1 week ago with sx of bad sinus congestion, headache behind his eyes. By 3 days, he had resolution of sx and by 4 days his test was (-).  Partner never tested positive.  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  Has not gotten shingles vax yet... Getting colon 2024  HIV 1 RNA Quant  Date Value  04/08/2021 Not Detected Copies/mL  06/22/2020 Not Detected Copies/mL  06/20/2019 <20 NOT DETECTED copies/mL   HIV-1 RNA Viral Load (copies/mL)  Date Value  01/10/2022 <20   CD4 T Cell Abs (/uL)  Date Value  01/10/2022 393 (L)  06/22/2020 370 (L)  06/20/2019 420   Lab Results  Component Value Date   CHOL 143 01/10/2022   HDL 28 (L) 01/10/2022   LDLCALC 91 01/10/2022   LDLDIRECT 117.0 02/22/2017   TRIG 135 01/10/2022   CHOLHDL 5.1 (H) 01/10/2022      Health Maintenance  Topic Date Due   Zoster Vaccines- Shingrix (1 of 2) Never done   COVID-19 Vaccine (5 - Pfizer risk series) 04/05/2021   INFLUENZA VACCINE  10/25/2021   COLONOSCOPY (Pts 45-86yrs Insurance coverage will need to be confirmed)  03/27/2022   TETANUS/TDAP  08/17/2026   Hepatitis C Screening  Completed   HIV Screening  Completed   HPV VACCINES  Aged Out      Review of Systems  Constitutional:  Negative for chills, fever and weight loss.  HENT:  Positive for congestion.   Respiratory:  Negative for cough.   Cardiovascular:  Negative for chest pain.  Gastrointestinal:  Negative for  abdominal pain and diarrhea.  Genitourinary:  Negative for dysuria.  Neurological:  Positive for headaches.    Please see HPI. All other systems reviewed and negative.     Objective:  Physical Exam Vitals reviewed.  Constitutional:      General: He is not in acute distress.    Appearance: Normal appearance. He is not ill-appearing.  HENT:     Mouth/Throat:     Mouth: Mucous membranes are moist.     Pharynx: Oropharynx is clear. No oropharyngeal exudate.  Cardiovascular:     Rate and Rhythm: Normal rate and regular rhythm.  Pulmonary:     Effort: Pulmonary effort is normal.     Breath sounds: Normal breath sounds.  Abdominal:     General: Bowel sounds are normal. There is no distension.     Palpations: Abdomen is soft.     Tenderness: There is no abdominal tenderness.  Musculoskeletal:     Cervical back: Normal range of motion and neck supple.     Right lower leg: No edema.     Left lower leg: No edema.  Neurological:     General: No focal deficit present.     Mental Status: He is alert.  Psychiatric:        Mood and Affect: Mood normal.  Assessment & Plan:

## 2022-01-24 NOTE — Assessment & Plan Note (Addendum)
He is doing well Will get COVID vaccine in 6 weeks Will get shingles vax Colon next year No change in art (? [place]) Partner is (-).  rtc in 9 months

## 2022-02-26 NOTE — Telephone Encounter (Signed)
Patient returned call stated all symptoms resolved feeling well.  Verified next covid vaccine date should be in 30 days (end of Dec).  Patient A&Ox3 spoke full sentences without difficulty no audible cough/congestion/throat clearing during 5 minute call.  Patient verbalized understanding information/instrucitons, agreed with plan of care and had no further questions at this time.

## 2022-04-14 ENCOUNTER — Other Ambulatory Visit (HOSPITAL_BASED_OUTPATIENT_CLINIC_OR_DEPARTMENT_OTHER): Payer: Self-pay

## 2022-04-14 MED ORDER — COVID-19 MRNA 2023-2024 VACCINE (COMIRNATY) 0.3 ML INJECTION
0.3000 mL | Freq: Once | INTRAMUSCULAR | 0 refills | Status: AC
  Filled 2022-04-14: qty 0.3, 1d supply, fill #0

## 2022-04-25 ENCOUNTER — Other Ambulatory Visit: Payer: Self-pay | Admitting: Family Medicine

## 2022-04-25 DIAGNOSIS — E781 Pure hyperglyceridemia: Secondary | ICD-10-CM

## 2022-05-07 ENCOUNTER — Other Ambulatory Visit: Payer: Self-pay | Admitting: Family Medicine

## 2022-06-13 ENCOUNTER — Other Ambulatory Visit: Payer: Self-pay | Admitting: Infectious Diseases

## 2022-06-13 DIAGNOSIS — B20 Human immunodeficiency virus [HIV] disease: Secondary | ICD-10-CM

## 2022-08-07 ENCOUNTER — Ambulatory Visit
Admission: EM | Admit: 2022-08-07 | Discharge: 2022-08-07 | Disposition: A | Discharge status: Home / Self Care | Payer: No Typology Code available for payment source | Attending: Nurse Practitioner | Admitting: Nurse Practitioner

## 2022-08-07 DIAGNOSIS — R197 Diarrhea, unspecified: Secondary | ICD-10-CM

## 2022-08-07 NOTE — ED Triage Notes (Addendum)
Pt presents to UC w/ c/o diarrhea x 5 days. States he believes it may have started as food poisoning 5 days ago, had nausea, vomting, headache, liquid stool for 3 days, now for the past 2 days, the stool is "oatmeal consistency and pale brown" and the nausea has improved. Pt concerned for a "bacterial infection."

## 2022-08-07 NOTE — ED Provider Notes (Signed)
[facility]    CSN: [id number] Arrival date & time: 08/07/22  1735      History   Chief Complaint Chief Complaint  Patient presents with   Diarrhea    HPI John Farrell is a 52 y.o. male presents for evaluation of diarrhea.  Patient reports 5 days ago he woke with the melanite with nausea and vomiting and abdominal pain.  Reports he vomited for 2 days which then resolved and he developed diarrhea which she states was initially liquidy but is now more soft/formed gray-colored stool.  Denies any black tarry stools.  No fevers or chills.  He is eating and drinking normally.  No history of IBS or Crohn's.  No history of GI surgeries in the past.  He think it may be related to some bad mediated restaurant.  He has not taken any OTC medications for symptoms.  No other concerns at this time.   Diarrhea   Past Medical History:  Diagnosis Date   DEPRESSION, MILD    GERD (gastroesophageal reflux disease)    HIV infection (HCC)    Hyperlipidemia    Hypertension    HYPERTRIGLYCERIDEMIA    Sinusitis    SYPHILIS    UNSPECIFIED NEURALGIA NEURITIS AND RADICULITIS     Patient Active Problem List   Diagnosis Date Noted   Protrusion of cervical intervertebral disc 10/12/2021   Hip flexor tendinitis, right 08/23/2017   Erectile disorder due to medical condition in male 08/14/2017   GERD (gastroesophageal reflux disease) 02/02/2016   Left shoulder pain 12/04/2012   History of cholecystectomy 05/29/2012   Abdominal pain 11/29/2011   Sinusitis 12/26/2010   UNSPECIFIED NEURALGIA NEURITIS AND RADICULITIS 06/17/2009   DEPRESSION, MILD 02/26/2007   Hypertension 10/29/2006   Hypertriglyceridemia 06/04/2006   Human immunodeficiency virus (HIV) disease (HCC) 05/29/2006   SYPHILIS 05/29/2006    Past Surgical History:  Procedure Laterality Date   CHOLECYSTECTOMY  2014       Home Medications    Prior to Admission medications   Medication Sig Start Date End Date Taking?  Authorizing Provider  BIKTARVY 50-200-25 MG TABS tablet Take 1 tablet by mouth once daily 06/13/22   Ginnie Smart, MD  fenofibrate (TRICOR) 145 MG tablet TAKE 1 TABLET BY MOUTH DAILY 04/25/22   Sharlene Dory, DO  fluticasone [facility]) 50 MCG/ACT nasal spray Place 1 spray into both nostrils daily. 08/28/19   [provider]  lisinopril (ZESTRIL) 5 MG tablet TAKE ONE TABLET BY MOUTH DAILY 09/20/21   Sharlene Dory, DO  rosuvastatin (CRESTOR) 20 MG tablet TAKE ONE TABLET BY MOUTH DAILY 05/08/22   Carmelia Roller, Jilda Roche, DO  sodium chloride (OCEAN) 0.65 % SOLN nasal spray Place 2 sprays into both nostrils every 2 (two) hours while awake. 03/22/21 04/21/21  Betancourt, Jarold Song, NP  tadalafil (CIALIS) 20 MG tablet Take 0.5-1 tablets (10-20 mg total) by mouth every other day as needed for erectile dysfunction. 10/20/21   Sharlene Dory, DO    Family History Family History  Problem Relation Age of Onset   Hypertension Mother    Colon polyps Mother    Hypertension Father    Heart disease Father    Heart attack Father    Hyperlipidemia Sister    Hypertension Sister    Diabetes Brother    Hyperlipidemia Brother    Hearing loss Maternal Grandmother    Heart disease Maternal Grandmother    Hyperlipidemia Maternal Grandmother    Hypertension Maternal Grandmother  Cancer Maternal Grandfather        lung cancer   Heart attack Paternal Grandmother    Heart attack Paternal Grandfather     Social History Social History   Tobacco Use   Smoking status: Former    Packs/day: .5    Types: Cigarettes    Quit date: 09/30/2015    Years since quitting: 6.8   Smokeless tobacco: Never   Tobacco comments:    congratulated!!!  Substance Use Topics   Alcohol use: No    Alcohol/week: 0.0 standard drinks of alcohol    Comment: rarely   Drug use: No     Allergies   Patient has no known allergies.   Review of Systems Review of Systems  Gastrointestinal:   Positive for diarrhea.     Physical Exam Triage Vital Signs ED Triage Vitals  Enc Vitals Group     BP 08/07/22 1909 (!) 152/89     Pulse Rate 08/07/22 1909 61     Resp 08/07/22 1908 16     Temp 08/07/22 1908 97.9 F (36.6 C)     Temp Source 08/07/22 1908 Oral     SpO2 08/07/22 1909 94 %     Weight --      Height --      Head Circumference --      Peak Flow --      Pain Score 08/07/22 1907 3     Pain Loc --      Pain Edu? --      Excl. in GC? --    No data found.  Updated Vital Signs BP (!) 152/89 (BP Location: Right Arm)   Pulse 61   Temp 97.9 F (36.6 C) (Oral)   Resp 16   SpO2 94%   Visual Acuity Right Eye Distance:   Left Eye Distance:   Bilateral Distance:    Right Eye Near:   Left Eye Near:    Bilateral Near:     Physical Exam Vitals and nursing note reviewed.  Constitutional:      General: He is not in acute distress.    Appearance: Normal appearance. He is not ill-appearing.  HENT:     Head: Normocephalic and atraumatic.  Eyes:     Pupils: Pupils are equal, round, and reactive to light.  Cardiovascular:     Rate and Rhythm: Normal rate.  Pulmonary:     Effort: Pulmonary effort is normal.  Abdominal:     General: Bowel sounds are normal. There is no distension.     Palpations: Abdomen is soft.     Tenderness: There is no abdominal tenderness. There is no guarding or rebound.  Skin:    General: Skin is warm and dry.  Neurological:     General: No focal deficit present.     Mental Status: He is alert and oriented to person, place, and time.  Psychiatric:        Mood and Affect: Mood normal.        Behavior: Behavior normal.      UC Treatments / Results  Labs (all labs ordered are listed, but only abnormal results are displayed) Labs Reviewed - No data to display  EKG   Radiology No results found.  Procedures Procedures (including critical care time)  Medications Ordered in UC Medications - No data to display  Initial  Impression / Assessment and Plan / UC Course  I have reviewed the triage vital signs and the nursing notes.  Pertinent labs &  imaging results that were available during my care of the patient were reviewed by me and considered in my medical decision making (see chart for details).     Reviewed exam and symptoms with patient.  No red flags. Patient concerned for parasite or bacterial process is requesting stool testing.  Stool kit given to patient and he will bring sample to clinic which we will then send to lab Discussed bland diet and hydration PCP follow-up if symptoms do not improve ER precautions reviewed and patient verbalized understanding Final Clinical Impressions(s) / UC Diagnoses   Final diagnoses:  Diarrhea, unspecified type     Discharge Instructions      The clinic will contact you with results of the school culture once we receive the sample and send it to the lab [place] diet advance as tolerated Remain hydrated with Gatorade, Powerade, Pedialyte, water Follow-up with your PCP if your symptoms or not improving Please go to the ER for any worsening symptoms    ED Prescriptions   None    PDMP not reviewed this encounter.   Radford Pax, NP 08/07/22 1926

## 2022-08-07 NOTE — Discharge Instructions (Addendum)
The clinic will contact you with results of the school culture once we receive the sample and send it to the lab [place] diet advance as tolerated Remain hydrated with Gatorade, Powerade, Pedialyte, water Follow-up with your PCP if your symptoms or not improving Please go to the ER for any worsening symptoms

## 2022-08-29 ENCOUNTER — Encounter: Payer: No Typology Code available for payment source | Admitting: Family Medicine

## 2022-08-30 ENCOUNTER — Ambulatory Visit (INDEPENDENT_AMBULATORY_CARE_PROVIDER_SITE_OTHER): Payer: No Typology Code available for payment source

## 2022-08-30 ENCOUNTER — Other Ambulatory Visit: Payer: Self-pay | Admitting: Family Medicine

## 2022-08-30 ENCOUNTER — Ambulatory Visit (INDEPENDENT_AMBULATORY_CARE_PROVIDER_SITE_OTHER): Payer: No Typology Code available for payment source | Admitting: Family Medicine

## 2022-08-30 ENCOUNTER — Encounter: Payer: Self-pay | Admitting: Family Medicine

## 2022-08-30 VITALS — BP 121/71 | HR 69 | Temp 97.9°F | Ht 71.0 in | Wt 190.2 lb

## 2022-08-30 DIAGNOSIS — Z1211 Encounter for screening for malignant neoplasm of colon: Secondary | ICD-10-CM

## 2022-08-30 DIAGNOSIS — R7303 Prediabetes: Secondary | ICD-10-CM | POA: Insufficient documentation

## 2022-08-30 DIAGNOSIS — Z0000 Encounter for general adult medical examination without abnormal findings: Secondary | ICD-10-CM | POA: Diagnosis not present

## 2022-08-30 DIAGNOSIS — E781 Pure hyperglyceridemia: Secondary | ICD-10-CM

## 2022-08-30 DIAGNOSIS — R7401 Elevation of levels of liver transaminase levels: Secondary | ICD-10-CM

## 2022-08-30 LAB — LIPID PANEL
Cholesterol: 123 mg/dL (ref 0–200)
HDL: 31.7 mg/dL — ABNORMAL LOW (ref >39.00)
LDL Cholesterol: 67 mg/dL (ref 0–99)
NonHDL: 91.77
Total CHOL/HDL Ratio: 4
Triglycerides: 125 mg/dL (ref 0.0–149.0)
VLDL: 25 mg/dL (ref 0.0–40.0)

## 2022-08-30 LAB — CBC
HCT: 45.8 % (ref 39.0–52.0)
Hemoglobin: 15.5 g/dL (ref 13.0–17.0)
MCHC: 33.9 g/dL (ref 30.0–36.0)
MCV: 86.5 fl (ref 78.0–100.0)
Platelets: 261 10*3/uL (ref 150.0–400.0)
RBC: 5.29 Mil/uL (ref 4.22–5.81)
RDW: 14.4 % (ref 11.5–15.5)
WBC: 5.2 10*3/uL (ref 4.0–10.5)

## 2022-08-30 LAB — COMPREHENSIVE METABOLIC PANEL
ALT: 36 U/L (ref 0–53)
AST: 39 U/L — ABNORMAL HIGH (ref 0–37)
Albumin: 4.4 g/dL (ref 3.5–5.2)
Alkaline Phosphatase: 42 U/L (ref 39–117)
BUN: 14 mg/dL (ref 6–23)
CO2: 24 mEq/L (ref 19–32)
Calcium: 9.9 mg/dL (ref 8.4–10.5)
Chloride: 103 mEq/L (ref 96–112)
Creatinine, Ser: 0.96 mg/dL (ref 0.40–1.50)
GFR: 90.73 mL/min (ref >60.00)
Glucose, Bld: 107 mg/dL — ABNORMAL HIGH (ref 70–99)
Potassium: 4.4 mEq/L (ref 3.5–5.1)
Sodium: 139 mEq/L (ref 135–145)
Total Bilirubin: 0.5 mg/dL (ref 0.2–1.2)
Total Protein: 7.3 g/dL (ref 6.0–8.3)

## 2022-08-30 LAB — HEMOGLOBIN A1C: Hgb A1c MFr Bld: 6 % (ref 4.6–6.5)

## 2022-08-30 NOTE — Progress Notes (Signed)
Chief Complaint  Patient presents with   Annual Exam    Well Male John Farrell is here for a complete physical.   His last physical was >1 year ago.  Current diet: in general, diet is fair.  Current exercise: walking Weight trend: stable Fatigue out of ordinary? No. Seat belt? Yes.   Advanced directive? No  Health maintenance Shingrix- No Colonoscopy- No Tetanus- Yes HIV- Yes Hep C- Yes   Past Medical History:  Diagnosis Date   DEPRESSION, MILD    GERD (gastroesophageal reflux disease)    HIV infection (HCC)    Hyperlipidemia    Hypertension    HYPERTRIGLYCERIDEMIA    Sinusitis    SYPHILIS    UNSPECIFIED NEURALGIA NEURITIS AND RADICULITIS     Past Surgical History:  Procedure Laterality Date   CHOLECYSTECTOMY  2014   Medications  Current Outpatient Medications on File Prior to Visit  Medication Sig Dispense Refill   BIKTARVY 50-200-25 MG TABS tablet Take 1 tablet by mouth once daily 90 tablet 0   fenofibrate (TRICOR) 145 MG tablet TAKE 1 TABLET BY MOUTH DAILY 90 tablet 1   fluticasone (FLONASE) 50 MCG/ACT nasal spray Place 1 spray into both nostrils daily.     lisinopril (ZESTRIL) 5 MG tablet TAKE ONE TABLET BY MOUTH DAILY 90 tablet 4   rosuvastatin (CRESTOR) 20 MG tablet TAKE ONE TABLET BY MOUTH DAILY 90 tablet 3   sodium chloride (OCEAN) 0.65 % SOLN nasal spray Place 2 sprays into both nostrils every 2 (two) hours while awake.  0   tadalafil (CIALIS) 20 MG tablet Take 0.5-1 tablets (10-20 mg total) by mouth every other day as needed for erectile dysfunction. 90 tablet 1   Allergies No Known Allergies  Family History Family History  Problem Relation Age of Onset   Hypertension Mother    Colon polyps Mother    Hypertension Father    Heart disease Father    Heart attack Father    Hyperlipidemia Sister    Hypertension Sister    Diabetes Brother    Hyperlipidemia Brother    Hearing loss Maternal Grandmother    Heart disease Maternal Grandmother     Hyperlipidemia Maternal Grandmother    Hypertension Maternal Grandmother    Cancer Maternal Grandfather        lung cancer   Heart attack Paternal Grandmother    Heart attack Paternal Grandfather     Review of Systems: Constitutional:  no fevers Eye:  no recent significant change in vision Ear/Nose/Mouth/Throat:  Ears:  no hearing loss Nose/Mouth/Throat:  no complaints of nasal congestion, no sore throat Cardiovascular:  no chest pain Respiratory:  no shortness of breath Gastrointestinal:  no recent change in bowel habits GU:  Male: negative for dysuria, frequency Musculoskeletal/Extremities:  no joint pain Integumentary (Skin/Breast):  no abnormal skin lesions reported Neurologic:  no headaches Endocrine: No unexpected weight changes Hematologic/Lymphatic:  no abnormal bleeding  Exam BP 121/71 (BP Location: Left Arm, Patient Position: Sitting, Cuff Size: Normal)   Pulse 69   Temp 97.9 F (36.6 C) (Oral)   Ht 5\' 11"  (1.803 m)   Wt 190 lb 4 oz (86.3 kg)   SpO2 93%   BMI 26.53 kg/m  General:  well developed, well nourished, in no apparent distress Skin:  no significant moles, warts, or growths Head:  no masses, lesions, or tenderness Eyes:  pupils equal and round, sclera anicteric without injection Ears:  canals without lesions, TMs shiny without retraction, no obvious effusion,  no erythema Nose:  nares patent, mucosa normal Throat/Pharynx:  lips and gingiva without lesion; tongue and uvula midline; non-inflamed pharynx; no exudates or postnasal drainage Neck: neck supple without adenopathy, thyromegaly, or masses Cardiac: RRR, no bruits, no LE edema Lungs:  clear to auscultation, breath sounds equal bilaterally, no respiratory distress Abdomen: BS+, soft, non-tender, non-distended, no masses or organomegaly noted Rectal: Deferred Musculoskeletal:  symmetrical muscle groups noted without atrophy or deformity Neuro:  gait normal; deep tendon reflexes normal and  symmetric Psych: well oriented with normal range of affect and appropriate judgment/insight  Assessment and Plan  Well adult exam - Plan: CBC, Comprehensive metabolic panel, Lipid panel  Screen for colon cancer - Plan: Ambulatory referral to Gastroenterology   Well 52 y.o. male. Counseled on diet and exercise. Counseled on risks and benefits of prostate cancer screening with PSA. The patient agrees to undergo testing. CCS: Referred again today.  Advanced directive form requested today.  Immunizations, labs, and further orders as above. Shingrix rec'd.  Follow up in 6 mo. The patient voiced understanding and agreement to the plan.  Jilda Roche [place], DO 08/30/22 7:12 AM

## 2022-08-30 NOTE — Patient Instructions (Signed)
Give us 2-3 business days to get the results of your labs back.   Keep the diet clean and stay active.  Please get me a copy of your advanced directive form at your convenience.   The Shingrix vaccine (for shingles) is a 2 shot series spaced 2-6 months apart. It can make people feel low energy, achy and almost like they have the flu for 48 hours after injection. 1/5 people can have nausea and/or vomiting. Please plan accordingly when deciding on when to get this shot. Call our office for a nurse visit appointment to get this. The second shot of the series is less severe regarding the side effects, but it still lasts 48 hours.   Let us know if you need anything.  

## 2022-09-06 ENCOUNTER — Ambulatory Visit (HOSPITAL_BASED_OUTPATIENT_CLINIC_OR_DEPARTMENT_OTHER)
Admission: RE | Admit: 2022-09-06 | Discharge: 2022-09-06 | Disposition: A | Discharge status: Home / Self Care | Payer: No Typology Code available for payment source | Source: Ambulatory Visit | Attending: Family Medicine | Admitting: Family Medicine

## 2022-09-06 DIAGNOSIS — R7401 Elevation of levels of liver transaminase levels: Secondary | ICD-10-CM | POA: Insufficient documentation

## 2022-09-07 ENCOUNTER — Encounter: Payer: Self-pay | Admitting: Family Medicine

## 2022-09-08 ENCOUNTER — Other Ambulatory Visit: Payer: Self-pay | Admitting: Family Medicine

## 2022-09-08 DIAGNOSIS — R7401 Elevation of levels of liver transaminase levels: Secondary | ICD-10-CM

## 2022-09-08 DIAGNOSIS — E781 Pure hyperglyceridemia: Secondary | ICD-10-CM

## 2022-09-10 ENCOUNTER — Other Ambulatory Visit: Payer: Self-pay | Admitting: Infectious Diseases

## 2022-09-10 DIAGNOSIS — B20 Human immunodeficiency virus [HIV] disease: Secondary | ICD-10-CM

## 2022-09-10 NOTE — Progress Notes (Signed)
Noted patient has had follow up labs 

## 2022-09-11 ENCOUNTER — Encounter: Payer: Self-pay | Admitting: Family Medicine

## 2022-09-12 ENCOUNTER — Ambulatory Visit: Payer: No Typology Code available for payment source | Admitting: Family Medicine

## 2022-09-12 ENCOUNTER — Other Ambulatory Visit: Payer: Self-pay | Admitting: Infectious Diseases

## 2022-09-12 VITALS — BP 138/84 | HR 85 | Temp 98.4°F | Ht 71.0 in | Wt 192.1 lb

## 2022-09-12 DIAGNOSIS — J029 Acute pharyngitis, unspecified: Secondary | ICD-10-CM | POA: Diagnosis not present

## 2022-09-12 DIAGNOSIS — B20 Human immunodeficiency virus [HIV] disease: Secondary | ICD-10-CM

## 2022-09-12 MED ORDER — METHYLPREDNISOLONE ACETATE 80 MG/ML IJ SUSP
80.0000 mg | Freq: Once | INTRAMUSCULAR | Status: AC
Start: 2022-09-12 — End: 2022-09-12
  Administered 2022-09-12: 80 mg via INTRAMUSCULAR

## 2022-09-12 MED ORDER — AMOXICILLIN-POT CLAVULANATE 875-125 MG PO TABS
1.0000 | ORAL_TABLET | Freq: Two times a day (BID) | ORAL | 0 refills | Status: AC
Start: 2022-09-14 — End: 2022-09-21

## 2022-09-12 NOTE — Patient Instructions (Addendum)
Consider throat lozenges, salt water gargles and an air humidifier for symptomatic care.   OK to take Tylenol 1000 mg (2 extra strength tabs) or 975 mg (3 regular strength tabs) every 6 hours as needed.  Ibuprofen 600 mg (3 over the counter strength tabs) every 6 hours as needed for pain. Start tomorrow since we are receiving the shot today.   If no better in 2 days, take the antibiotic.   Let us know if you need anything.

## 2022-09-12 NOTE — Progress Notes (Signed)
Chief Complaint  Patient presents with   Sore Throat    Arnell Asal here for URI complaints.  Duration: 4 days  Associated symptoms: Fever (101 F), ear fullness, and sore throat Denies: sinus congestion, sinus pain, rhinorrhea, itchy watery eyes, ear pain, ear drainage, wheezing, shortness of breath, myalgia, and coughing Treatment to date: Advil, Tylenol Sick contacts: No  Past Medical History:  Diagnosis Date   DEPRESSION, MILD    GERD (gastroesophageal reflux disease)    HIV infection (HCC)    Hyperlipidemia    Hypertension    HYPERTRIGLYCERIDEMIA    Prediabetes    Sinusitis    SYPHILIS    UNSPECIFIED NEURALGIA NEURITIS AND RADICULITIS     Objective BP 138/84 (BP Location: Left Arm, Patient Position: Sitting, Cuff Size: Normal)   Pulse 85   Temp 98.4 F (36.9 C) (Oral)   Ht 5\' 11"  (1.803 m)   Wt 192 lb 2 oz (87.1 kg)   SpO2 98%   BMI 26.80 kg/m  General: Awake, alert, appears stated age HEENT: AT, [place], ears patent b/l and TM's neg, nares patent w/o discharge, pharynx erythematous tho without exudates, MMM, no sinus ttp throughout Neck: No masses or asymmetry Heart: RRR Lungs: CTAB, no accessory muscle use Psych: Age appropriate judgment and insight, normal mood and affect  Sore throat - Plan: amoxicillin-clavulanate (AUGMENTIN) 875-125 MG tablet, methylPREDNISolone acetate (DEPO-MEDROL) injection 80 mg  Depo-Medrol injection today.  Probably viral, will send in Augmentin to use if he does not improve in the next 2 days.  Continue to push fluids, practice good hand hygiene, cover mouth when coughing. F/u prn. If starting to experience return of fevers, worsening pain, shaking, or shortness of breath, seek immediate care. Pt voiced understanding and agreement to the plan.  Jilda Roche [place], DO 09/12/22 12:12 PM

## 2022-09-18 ENCOUNTER — Ambulatory Visit: Payer: No Typology Code available for payment source | Admitting: Occupational Medicine

## 2022-09-18 DIAGNOSIS — Z0000 Encounter for general adult medical examination without abnormal findings: Secondary | ICD-10-CM

## 2022-09-18 NOTE — Progress Notes (Signed)
[employer] insurance premium discount evaluation: Met   Patient completed PCM office visit. Epic reviewed by RN Kimrey transcribed labs and reviewed with the patient. Tobacco attestation signed. [facility] ROI formed signed. Forms placed in the chart.   Patient given handouts for [facility] pharmacies and discount drugs list, MyChart, [facility], [facility], [place] counseling and [facility].  What to do for infectious illness protocol. Given handout for list of medications that can be filled at [facility]. Given Clinic hours and Clinic Email.  

## 2022-10-21 ENCOUNTER — Other Ambulatory Visit: Payer: Self-pay | Admitting: Family Medicine

## 2022-10-21 DIAGNOSIS — E781 Pure hyperglyceridemia: Secondary | ICD-10-CM

## 2022-11-20 ENCOUNTER — Other Ambulatory Visit: Payer: No Typology Code available for payment source

## 2022-12-06 ENCOUNTER — Ambulatory Visit
Admission: RE | Admit: 2022-12-06 | Discharge: 2022-12-06 | Disposition: A | Discharge status: Home / Self Care | Payer: No Typology Code available for payment source | Source: Ambulatory Visit | Attending: Internal Medicine | Admitting: Internal Medicine

## 2022-12-06 ENCOUNTER — Other Ambulatory Visit: Payer: Self-pay

## 2022-12-06 VITALS — BP 158/91 | HR 80 | Temp 99.7°F | Resp 18

## 2022-12-06 DIAGNOSIS — K529 Noninfective gastroenteritis and colitis, unspecified: Secondary | ICD-10-CM

## 2022-12-06 DIAGNOSIS — R109 Unspecified abdominal pain: Secondary | ICD-10-CM | POA: Diagnosis not present

## 2022-12-06 MED ORDER — DICYCLOMINE HCL 20 MG PO TABS
20.0000 mg | ORAL_TABLET | Freq: Two times a day (BID) | ORAL | 0 refills | Status: DC | PRN
Start: 2022-12-06 — End: 2023-07-18

## 2022-12-06 NOTE — Discharge Instructions (Signed)
You may take Bentyl twice daily as needed for abdominal cramping.  Please stay hydrated with Gatorade, Powerade, Pedialyte, water.  You may take Imodium over-the-counter if needed.  Please follow-up with your PCP in 2 days for recheck.  Please go to the emergency room if you develop any worsening symptoms.  I hope you feel better soon!

## 2022-12-06 NOTE — ED Provider Notes (Signed)
[facility]    CSN: [id number] Arrival date & time: 12/06/22  1448      History   Chief Complaint Chief Complaint  Patient presents with   Diarrhea    Also running low fever and headache - Entered by patient    HPI John Farrell is a 52 y.o. male presents for diarrhea.  Patient reports 2 days of episodes of nonbloody diarrhea with nausea.  Does report he had a temperature of 99 to 100 degrees.  No vomiting, URI symptoms.  No dysuria.  Occasional abdominal cramping.  No recent travel or change in diet.  No sick contacts.  He is able to eat and is staying hydrated.  Reports a negative home COVID test.  No OTC medications have been used since onset.  No other concerns at this time.   Diarrhea Associated symptoms: abdominal pain     Past Medical History:  Diagnosis Date   DEPRESSION, MILD    GERD (gastroesophageal reflux disease)    HIV infection (HCC)    Hyperlipidemia    Hypertension    HYPERTRIGLYCERIDEMIA    Prediabetes    Sinusitis    SYPHILIS    UNSPECIFIED NEURALGIA NEURITIS AND RADICULITIS     Patient Active Problem List   Diagnosis Date Noted   Prediabetes 08/30/2022   Protrusion of cervical intervertebral disc 10/12/2021   Hip flexor tendinitis, right 08/23/2017   Erectile disorder due to medical condition in male 08/14/2017   GERD (gastroesophageal reflux disease) 02/02/2016   Left shoulder pain 12/04/2012   History of cholecystectomy 05/29/2012   Abdominal pain 11/29/2011   Sinusitis 12/26/2010   UNSPECIFIED NEURALGIA NEURITIS AND RADICULITIS 06/17/2009   DEPRESSION, MILD 02/26/2007   Hypertension 10/29/2006   Hypertriglyceridemia 06/04/2006   Human immunodeficiency virus (HIV) disease (HCC) 05/29/2006   SYPHILIS 05/29/2006    Past Surgical History:  Procedure Laterality Date   CHOLECYSTECTOMY  2014       Home Medications    Prior to Admission medications   Medication Sig Start Date End Date Taking? Authorizing Provider   dicyclomine (BENTYL) 20 MG tablet Take 1 tablet (20 mg total) by mouth 2 (two) times daily as needed (abdominal cramping). 12/06/22  Yes Radford Pax, NP  BIKTARVY 50-200-25 MG TABS tablet Take 1 tablet by mouth once daily 09/11/22   Tyson Alias, MD  fenofibrate (TRICOR) 145 MG tablet TAKE 1 TABLET BY MOUTH DAILY 10/23/22   Sharlene Dory, DO  fluticasone [facility]) 50 MCG/ACT nasal spray Place 1 spray into both nostrils daily. 08/28/19   [provider]  lisinopril (ZESTRIL) 5 MG tablet TAKE ONE TABLET BY MOUTH DAILY 09/20/21   Sharlene Dory, DO  rosuvastatin (CRESTOR) 20 MG tablet TAKE ONE TABLET BY MOUTH DAILY 05/08/22   Carmelia Roller, Jilda Roche, DO  sodium chloride (OCEAN) 0.65 % SOLN nasal spray Place 2 sprays into both nostrils every 2 (two) hours while awake. 03/22/21 04/21/21  Betancourt, Jarold Song, NP  tadalafil (CIALIS) 20 MG tablet Take 0.5-1 tablets (10-20 mg total) by mouth every other day as needed for erectile dysfunction. 10/20/21   Sharlene Dory, DO    Family History Family History  Problem Relation Age of Onset   Hypertension Mother    Colon polyps Mother    Hypertension Father    Heart disease Father    Heart attack Father    Hyperlipidemia Sister    Hypertension Sister    Diabetes Brother    Hyperlipidemia Brother  Hearing loss Maternal Grandmother    Heart disease Maternal Grandmother    Hyperlipidemia Maternal Grandmother    Hypertension Maternal Grandmother    Cancer Maternal Grandfather        lung cancer   Heart attack Paternal Grandmother    Heart attack Paternal Grandfather     Social History Social History   Tobacco Use   Smoking status: Former    Current packs/day: 0.00    Types: Cigarettes    Quit date: 09/30/2015    Years since quitting: 7.1   Smokeless tobacco: Never   Tobacco comments:    congratulated!!!  Substance Use Topics   Alcohol use: No    Alcohol/week: 0.0 standard drinks of alcohol     Comment: rarely   Drug use: No     Allergies   Patient has no known allergies.   Review of Systems Review of Systems  Gastrointestinal:  Positive for abdominal pain and diarrhea.     Physical Exam Triage Vital Signs ED Triage Vitals  Encounter Vitals Group     BP 12/06/22 1541 (!) 158/91     Systolic BP Percentile --      Diastolic BP Percentile --      Pulse Rate 12/06/22 1541 80     Resp 12/06/22 1541 18     Temp 12/06/22 1541 99.7 F (37.6 C)     Temp Source 12/06/22 1541 Oral     SpO2 12/06/22 1541 93 %     Weight --      Height --      Head Circumference --      Peak Flow --      Pain Score 12/06/22 1539 0     Pain Loc --      Pain Education --      Exclude from Growth Chart --    No data found.  Updated Vital Signs BP (!) 158/91 (BP Location: Right Arm)   Pulse 80   Temp 99.7 F (37.6 C) (Oral)   Resp 18   SpO2 93%   Visual Acuity Right Eye Distance:   Left Eye Distance:   Bilateral Distance:    Right Eye Near:   Left Eye Near:    Bilateral Near:     Physical Exam Vitals and nursing note reviewed.  Constitutional:      General: He is not in acute distress.    Appearance: Normal appearance. He is not ill-appearing, toxic-appearing or diaphoretic.  HENT:     Head: Normocephalic and atraumatic.  Eyes:     Pupils: Pupils are equal, round, and reactive to light.  Cardiovascular:     Rate and Rhythm: Normal rate.  Pulmonary:     Effort: Pulmonary effort is normal.  Abdominal:     General: Bowel sounds are increased.     Palpations: Abdomen is soft.     Tenderness: There is abdominal tenderness in the left upper quadrant and left lower quadrant. Negative signs include Rovsing's sign and McBurney's sign.     Comments: Very mild tenderness to left upper and lower quadrants.  Skin:    General: Skin is warm and dry.  Neurological:     General: No focal deficit present.     Mental Status: He is alert and oriented to person, place, and time.       UC Treatments / Results  Labs (all labs ordered are listed, but only abnormal results are displayed) Labs Reviewed - No data to display  EKG  Radiology No results found.  Procedures Procedures (including critical care time)  Medications Ordered in UC Medications - No data to display  Initial Impression / Assessment and Plan / UC Course  I have reviewed the triage vital signs and the nursing notes.  Pertinent labs & imaging results that were available during my care of the patient were reviewed by me and considered in my medical decision making (see chart for details).     Reviewed exam and symptoms with patient.  No red flags.  Discussed likely viral enteritis.  Trial of Bentyl for abdominal cramping as needed.  May use OTC Imodium if needed.  Encouraged hydration/electrolyte replacement.  Brat diet and advance as tolerated.  PCP follow-up 2 days for recheck.  ER precautions reviewed and patient verbalized understanding. Final Clinical Impressions(s) / UC Diagnoses   Final diagnoses:  Gastroenteritis  Abdominal cramping     Discharge Instructions      You may take Bentyl twice daily as needed for abdominal cramping.  Please stay hydrated with Gatorade, Powerade, Pedialyte, water.  You may take Imodium over-the-counter if needed.  Please follow-up with your PCP in 2 days for recheck.  Please go to the emergency room if you develop any worsening symptoms.  I hope you feel better soon!    ED Prescriptions     Medication Sig Dispense Auth. Provider   dicyclomine (BENTYL) 20 MG tablet Take 1 tablet (20 mg total) by mouth 2 (two) times daily as needed (abdominal cramping). 10 tablet Radford Pax, NP      PDMP not reviewed this encounter.   Radford Pax, NP 12/06/22 1635

## 2022-12-06 NOTE — ED Triage Notes (Addendum)
Pt is here with diarrhea that started Monday night, pt has not taken any meds to relieve discomfort. Tuesday night pt states he had a temp of 100.

## 2022-12-13 ENCOUNTER — Other Ambulatory Visit: Payer: Self-pay | Admitting: Student in an Organized Health Care Education/Training Program

## 2022-12-13 DIAGNOSIS — B20 Human immunodeficiency virus [HIV] disease: Secondary | ICD-10-CM

## 2022-12-20 ENCOUNTER — Encounter: Payer: Self-pay | Admitting: Family Medicine

## 2022-12-20 DIAGNOSIS — I10 Essential (primary) hypertension: Secondary | ICD-10-CM

## 2022-12-21 MED ORDER — LISINOPRIL 5 MG PO TABS: 5.0000 mg | ORAL_TABLET | Freq: Every day | ORAL | 1 refills | Status: DC

## 2023-01-18 ENCOUNTER — Ambulatory Visit: Payer: No Typology Code available for payment source

## 2023-01-18 DIAGNOSIS — Z23 Encounter for immunization: Secondary | ICD-10-CM

## 2023-02-27 ENCOUNTER — Other Ambulatory Visit: Payer: Self-pay | Admitting: Infectious Diseases

## 2023-02-27 ENCOUNTER — Encounter: Payer: Self-pay | Admitting: Family Medicine

## 2023-02-27 DIAGNOSIS — B20 Human immunodeficiency virus [HIV] disease: Secondary | ICD-10-CM

## 2023-02-28 ENCOUNTER — Other Ambulatory Visit: Payer: Self-pay | Admitting: Family Medicine

## 2023-02-28 MED ORDER — TADALAFIL 20 MG PO TABS: 10.0000 mg | ORAL_TABLET | ORAL | 1 refills | Status: AC | PRN

## 2023-03-02 ENCOUNTER — Ambulatory Visit: Payer: No Typology Code available for payment source | Admitting: Family Medicine

## 2023-03-20 IMAGING — MR MR CERVICAL SPINE W/O CM
5 series · 33 of 48 positions shown · non-contrast
Comparison: Radiograph from 08/07/2021.

CLINICAL DATA: Initial evaluation for numbness in left fingers,
tension and left forearm, left scapular discomfort.

EXAM:
MRI CERVICAL SPINE WITHOUT CONTRAST
TECHNIQUE: Multiplanar, multisequence MR imaging of the cervical spine was
performed. No intravenous contrast was administered.

[Series 3: T2 · sagittal · 3.0mm · 0.41mm/px · 6 of 13 slices shown (1 of 2)]
[im 1/13]
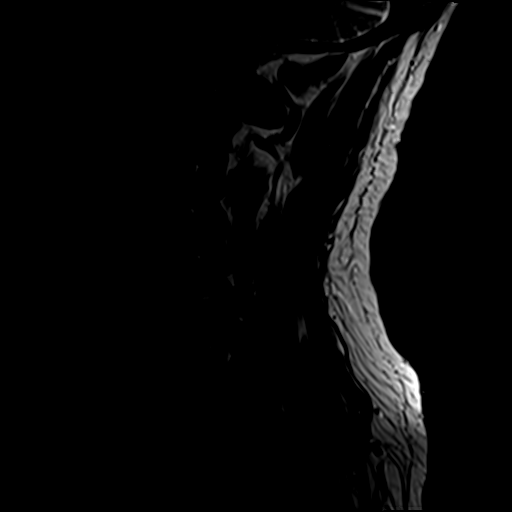
[im 3/13]
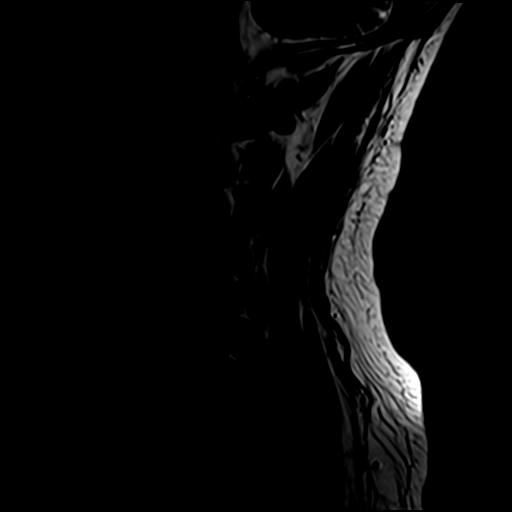
[im 5/13]
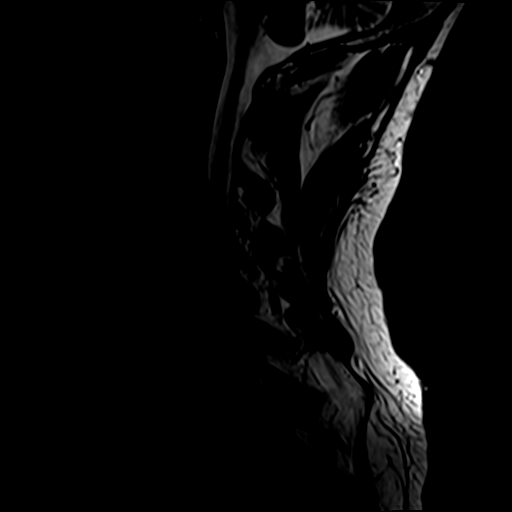
[im 8/13]
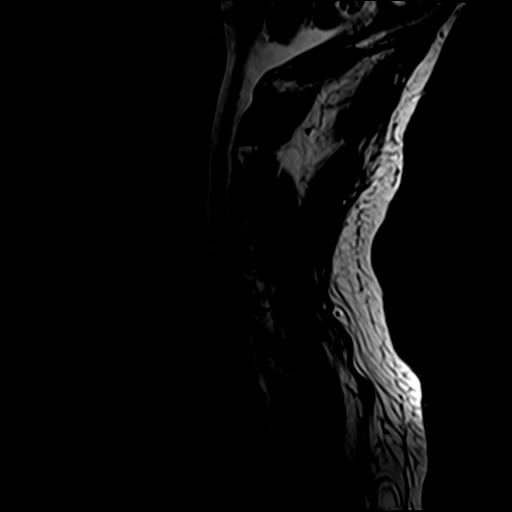
[im 10/13]
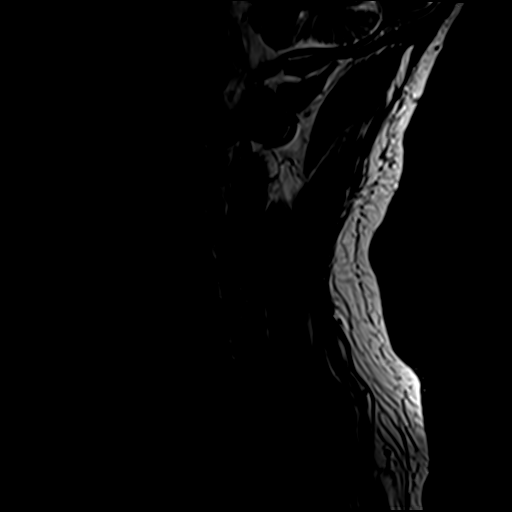
[im 13/13]
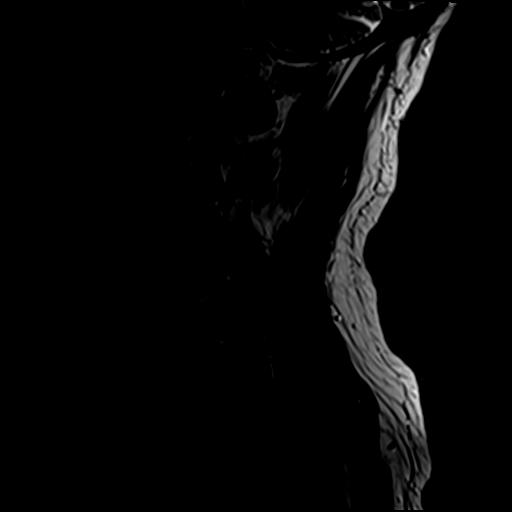

[Series 4: STIR · sagittal · 3.0mm · 0.82mm/px · 6 of 13 slices shown]
[im 1/13]
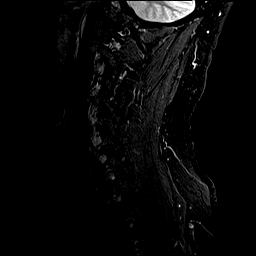
[im 3/13]
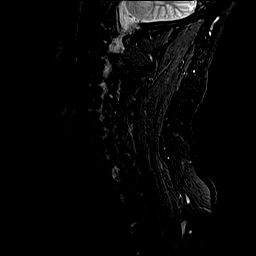
[im 5/13]
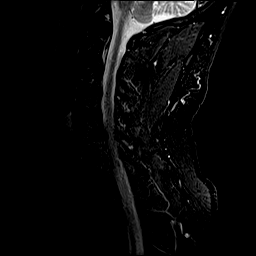
[im 8/13]
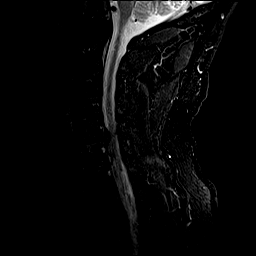
[im 10/13]
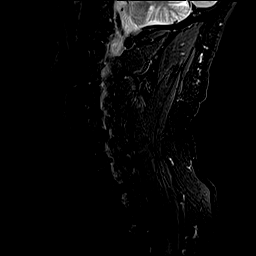
[im 13/13]
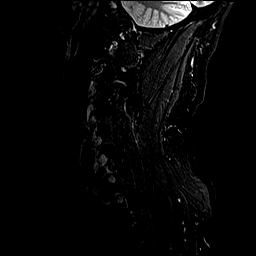

[Series 5: T1 · sagittal · 3.0mm · 0.82mm/px · 6 of 13 slices shown]
[im 1/13]
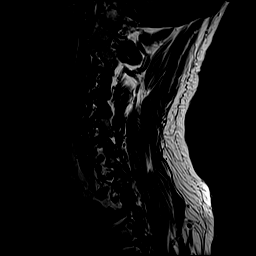
[im 3/13]
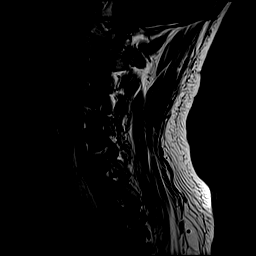
[im 5/13]
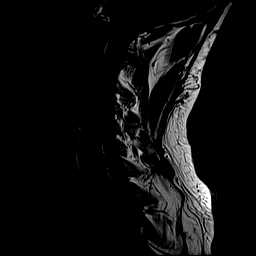
[im 8/13]
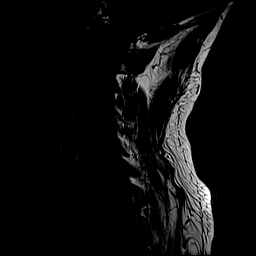
[im 10/13]
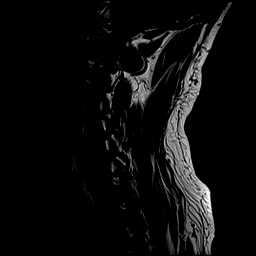
[im 13/13]
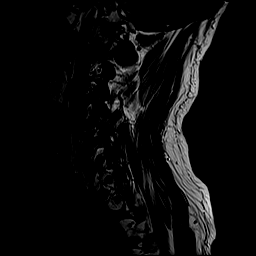

[Series 6: T2 · axial · 3.0mm · 0.70mm/px · z∈[-59,+58]mm · 9 of 32 slices shown (2 of 2)]
[im 1/32]
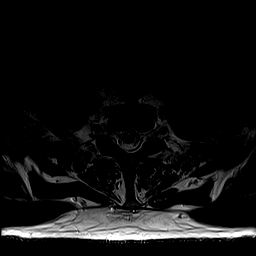
[im 5/32]
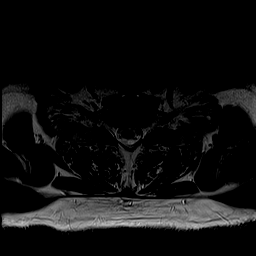
[im 9/32]
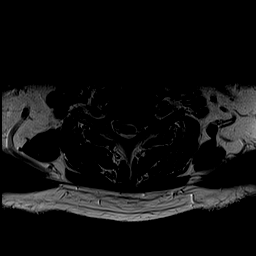
[im 14/32]
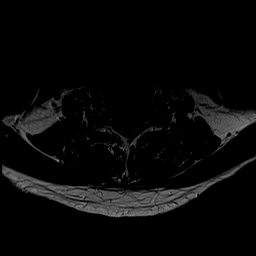
[im 16/32]
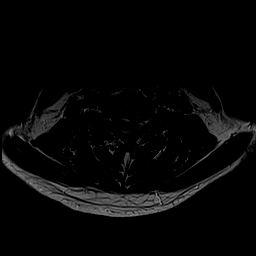
[im 18/32]
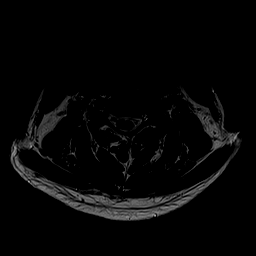
[im 23/32]
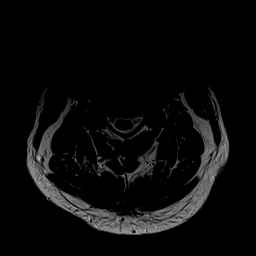
[im 27/32]
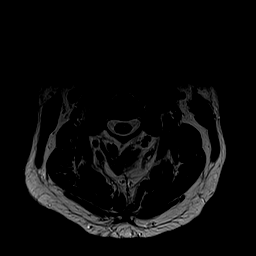
[im 32/32]
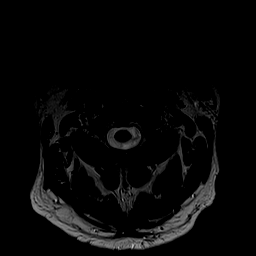

[Series 7: GRE · axial · 3.0mm · 0.35mm/px · z∈[-59,+24]mm · 6 of 32 slices shown]
[im 1/32]
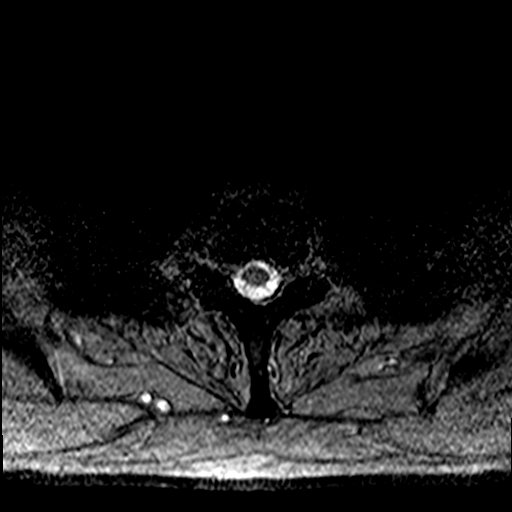
[im 5/32]
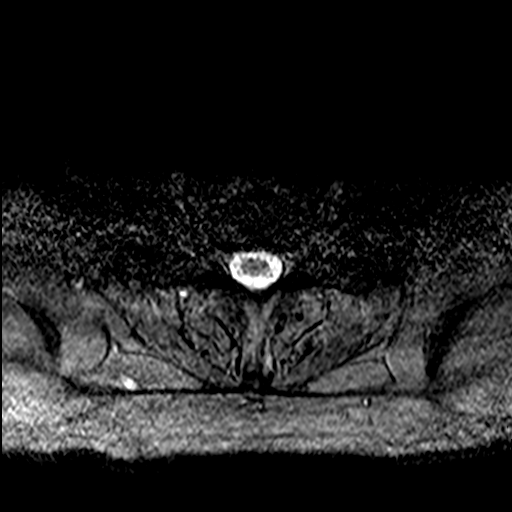
[im 9/32]
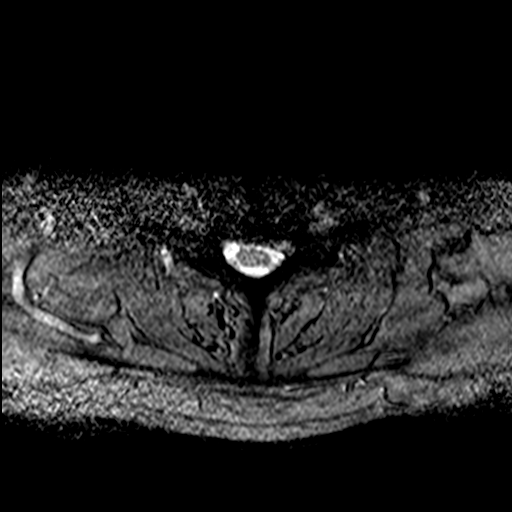
[im 14/32]
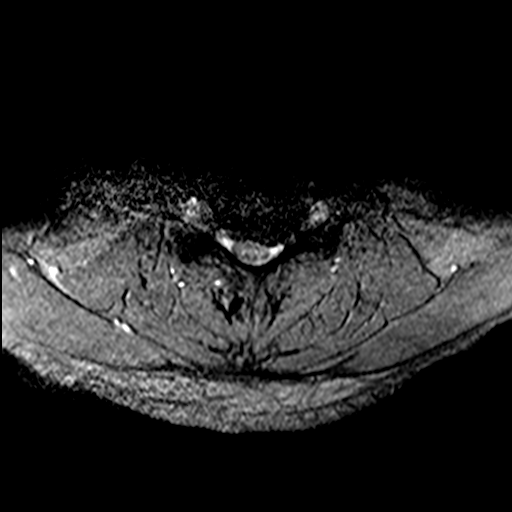
[im 18/32]
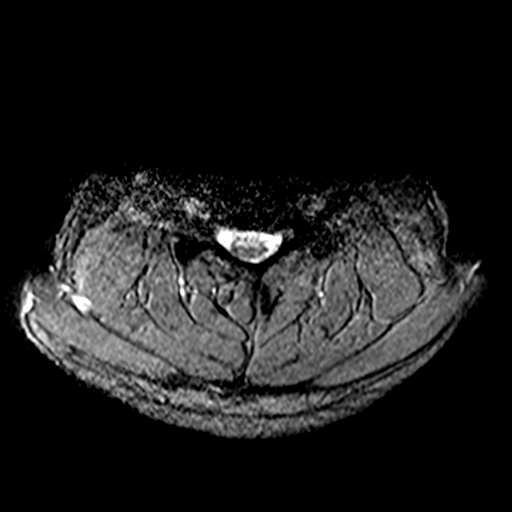
[im 23/32]
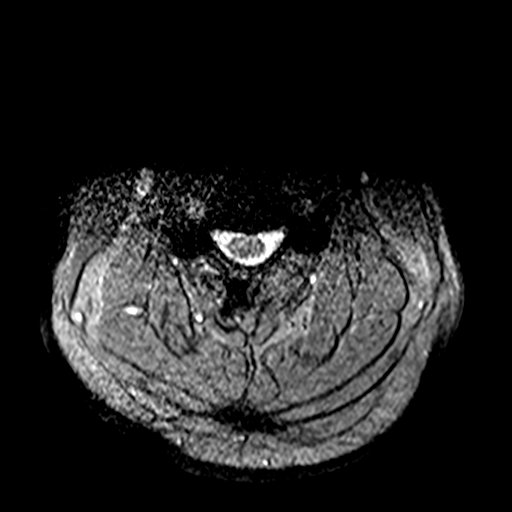

[33 of 48 positions shown; findings below may reference images not displayed]

FINDINGS: Alignment: Straightening of the normal cervical lordosis. No
listhesis.

Vertebrae: Vertebral body height maintained without acute or chronic
fracture. Bone marrow signal intensity within normal limits. No
worrisome osseous lesions. Mild discogenic reactive endplate change
present about the C5-6 interspace. No other abnormal marrow edema.

Cord: Normal signal morphology.

Posterior Fossa, vertebral arteries, paraspinal tissues:
Unremarkable.

Disc levels:

C2-C3: Right-sided uncovertebral spurring without significant disc
bulge. Mild left-sided facet hypertrophy. No stenosis.

C3-C4: Mild disc bulge with uncovertebral spurring. No spinal
stenosis. Foramina remain patent.

C4-C5: Mild disc bulge with bilateral uncovertebral spurring,
slightly eccentric to the left. Flattening of the ventral thecal sac
without significant spinal stenosis. Mild left C5 foraminal
narrowing. Right neural foramen remains patent.

C5-C6: Moderate degenerative intervertebral disc space narrowing.
Broad-based left paracentral disc protrusion flattens and effaces
the ventral thecal sac (series 6, image 19). Secondary cord
flattening without cord signal changes. Moderate spinal stenosis.
Left worse than right uncovertebral spurring with resultant severe
left with moderate right C6 foraminal narrowing.

C6-C7: Broad-based left paracentral to foraminal disc protrusion
(series 7, image 23). Flattening of the thecal sac with minimal cord
flattening, but no cord signal changes. Mild to moderate spinal
stenosis with severe left C7 foraminal narrowing. Right neural
foramen remains patent.

C7-T1:  Unremarkable.

Visualized upper thoracic spine demonstrates no significant finding.
IMPRESSION: 1. Broad-based left paracentral to foraminal disc protrusion at C6-7
with resultant mild to moderate spinal stenosis, with severe left C7
foraminal narrowing.
2. Broad-based left paracentral to foraminal disc protrusion at C5-6
with resultant moderate spinal stenosis, with severe left and
moderate right C6 foraminal narrowing.
3. Mild left eccentric disc bulge with uncovertebral spurring at
C4-5 with resultant mild left C5 foraminal stenosis.

## 2023-04-12 ENCOUNTER — Ambulatory Visit: Payer: No Typology Code available for payment source | Admitting: Registered Nurse

## 2023-04-12 ENCOUNTER — Encounter: Payer: Self-pay | Admitting: Registered Nurse

## 2023-04-12 VITALS — BP 144/70 | HR 97 | Temp 97.9°F

## 2023-04-12 DIAGNOSIS — J111 Influenza due to unidentified influenza virus with other respiratory manifestations: Secondary | ICD-10-CM

## 2023-04-12 DIAGNOSIS — H6122 Impacted cerumen, left ear: Secondary | ICD-10-CM

## 2023-04-12 MED ORDER — SALINE SPRAY 0.65 % NA SOLN: 2.0000 | NASAL | Status: DC

## 2023-04-12 MED ORDER — PSEUDOEPH-BROMPHEN-DM 30-2-10 MG/5ML PO SYRP
5.0000 mL | ORAL_SOLUTION | Freq: Four times a day (QID) | ORAL | 0 refills | Status: AC | PRN
Start: 2023-04-12 — End: ?

## 2023-04-12 NOTE — Progress Notes (Signed)
Subjective:     Patient ID: John Farrell, male   DOB: [birth year], 53 y.o.   MRN: [id number]  52y/o caucasian male established patient here for re-evaluation after influenza infection last week.  Still having some cough.  Mucous clear sometimes blood streaked in am but resolves.  Mucous clear yellow.  Denied fever/chills/n/v/d/dyspnea/wheezing.  Feeling better overall but would like cough to stop as not allowing him to have restful sleep.  Tessalon pearles not helping.     Review of Systems  Constitutional:  Positive for fatigue. Negative for chills, diaphoresis and fever.  HENT:  Positive for congestion, postnasal drip, rhinorrhea and sinus pressure. Negative for ear discharge, ear pain, facial swelling, hearing loss, mouth sores, nosebleeds, sinus pain and sneezing.   Eyes:  Negative for photophobia and visual disturbance.  Respiratory:  Positive for cough. Negative for choking, chest tightness, shortness of breath, wheezing and stridor.   Cardiovascular:  Negative for chest pain, palpitations and leg swelling.  Gastrointestinal:  Negative for diarrhea, nausea and vomiting.  Genitourinary:  Negative for difficulty urinating.  Musculoskeletal:  Negative for back pain, gait problem, myalgias, neck pain and neck stiffness.  Skin:  Negative for rash.  Neurological:  Negative for dizziness, tremors, seizures, syncope, facial asymmetry, speech difficulty, weakness, light-headedness and numbness.  Hematological:  Negative for adenopathy. Does not bruise/bleed easily.  Psychiatric/Behavioral:  Positive for sleep disturbance. Negative for agitation and confusion.        Objective:   Physical Exam Vitals and nursing note reviewed.  Constitutional:      General: He is not in acute distress.    Appearance: Normal appearance. He is well-developed, well-groomed and normal weight. He is not ill-appearing, toxic-appearing or diaphoretic.  HENT:     Head: Normocephalic and atraumatic.     Jaw:  There is normal jaw occlusion.     Salivary Glands: Right salivary gland is not diffusely enlarged or tender. Left salivary gland is not diffusely enlarged or tender.     Right Ear: Hearing, ear canal and external ear normal. No decreased hearing noted. No laceration, drainage, swelling or tenderness. A middle ear effusion is present. There is no impacted cerumen. No foreign body. No mastoid tenderness. No PE tube. No hemotympanum. Tympanic membrane is not injected, scarred, perforated, erythematous, retracted or bulging.     Left Ear: Hearing, ear canal and external ear normal. No decreased hearing noted. No laceration, drainage, swelling or tenderness. A middle ear effusion is present. There is impacted cerumen. No foreign body. No mastoid tenderness. No PE tube. No hemotympanum. Tympanic membrane is not injected, scarred, perforated, erythematous, retracted or bulging.     Ears:     Comments: Gold soft wax left auditory canal 100% occluded unable to view TM verbal consent obtained from patient to use curettage NP removed cerumen 90% bilateral TMs intact air fluid level clear     Nose: Mucosal edema, congestion and rhinorrhea present. No laceration or nasal tenderness. Rhinorrhea is clear.     Right Nostril: No epistaxis.     Left Nostril: No epistaxis.     Right Turbinates: Enlarged and swollen. Not pale.     Left Turbinates: Enlarged and swollen. Not pale.     Right Sinus: No maxillary sinus tenderness or frontal sinus tenderness.     Left Sinus: No maxillary sinus tenderness or frontal sinus tenderness.     Mouth/Throat:     Lips: Pink. No lesions.     Mouth: Mucous membranes are moist. No oral  lesions or angioedema.     Dentition: No gum lesions.     Tongue: No lesions. Tongue does not deviate from midline.     Palate: No mass and lesions.     Pharynx: Uvula midline. Pharyngeal swelling, posterior oropharyngeal erythema and postnasal drip present. No oropharyngeal exudate or uvula swelling.      Tonsils: No tonsillar exudate. 0 on the right. 0 on the left.     Comments: Cobblestoning posterior pharynx;macular erythema oropharynx; bilateral allergic shiners; nasal turbinates edema/erythema clear discharge Eyes:     General: Lids are normal. Vision grossly intact. Gaze aligned appropriately. Allergic shiner present. No scleral icterus.       Right eye: No discharge.        Left eye: No discharge.     Extraocular Movements: Extraocular movements intact.     Conjunctiva/sclera: Conjunctivae normal.     Pupils: Pupils are equal, round, and reactive to light.  Neck:     Trachea: Trachea and phonation normal.  Cardiovascular:     Rate and Rhythm: Normal rate and regular rhythm.     Pulses: Normal pulses.          Radial pulses are 2+ on the right side and 2+ on the left side.     Heart sounds: Normal heart sounds, S1 normal and S2 normal.  Pulmonary:     Effort: Pulmonary effort is normal. No respiratory distress.     Breath sounds: Normal breath sounds and air entry. No stridor, decreased air movement or transmitted upper airway sounds. No decreased breath sounds, wheezing, rhonchi or rales.     Comments: No cough observed in exam room; spoke full sentences without difficulty Abdominal:     General: Abdomen is flat.     Palpations: Abdomen is soft.  Musculoskeletal:        General: Normal range of motion.     Right hand: Normal strength. Normal capillary refill.     Left hand: Normal strength. Normal capillary refill.     Cervical back: Normal range of motion and neck supple. No swelling, edema, deformity, erythema, signs of trauma, lacerations, rigidity, spasms, torticollis, tenderness or crepitus. No pain with movement or muscular tenderness. Normal range of motion.     Thoracic back: No swelling, edema, deformity, signs of trauma, lacerations, spasms or tenderness. Normal range of motion.     Right lower leg: No edema.     Left lower leg: No edema.  Lymphadenopathy:      Head:     Right side of head: No submental, submandibular, tonsillar, preauricular, posterior auricular or occipital adenopathy.     Left side of head: No submental, submandibular, tonsillar, preauricular, posterior auricular or occipital adenopathy.     Cervical: No cervical adenopathy.     Right cervical: No superficial, deep or posterior cervical adenopathy.    Left cervical: No superficial, deep or posterior cervical adenopathy.  Skin:    General: Skin is warm and dry.     Capillary Refill: Capillary refill takes less than 2 seconds.     Coloration: Skin is not ashen, cyanotic, jaundiced, mottled, pale or sallow.     Findings: No abrasion, abscess, acne, bruising, burn, ecchymosis, erythema, signs of injury, laceration, lesion, petechiae, rash or wound.     Nails: There is no clubbing.  Neurological:     General: No focal deficit present.     Mental Status: He is alert and oriented to person, place, and time. Mental status is at baseline.  GCS: GCS eye subscore is 4. GCS verbal subscore is 5. GCS motor subscore is 6.     Cranial Nerves: Cranial nerves 2-12 are intact. No cranial nerve deficit, dysarthria or facial asymmetry.     Sensory: No sensory deficit.     Motor: Motor function is intact. No weakness, tremor, atrophy, abnormal muscle tone or seizure activity.     Coordination: Coordination normal.     Gait: Gait normal.     Comments: In/out of chair and on/off exam table without difficulty; gait sure and steady in clinic; bilateral hand grasp equal 5/5  Psychiatric:        Attention and Perception: Attention and perception normal.        Mood and Affect: Mood and affect normal.        Speech: Speech normal.        Behavior: Behavior normal. Behavior is cooperative.        Thought Content: Thought content normal.        Cognition and Memory: Cognition and memory normal.        Judgment: Judgment normal.        Assessment:     A-influenza, rhinitis acute    Plan:      P-send me picture of nose spray active ingredients.  Patient may use normal saline nasal spray 2 sprays each nostril q2h wa as needed. flonase 1 spray each nostril BID OTC Patient denied personal or family history of ENT cancer.  OTC antihistamine of choice allegra 180mg  po daily   Hold allegra if taking 4 doses bromphed daily.  Discussed 3 medications in bromphed DM with patient and purpose try up drip and suppress cough.  Discussed if afrin at home max use 5 days.  Patient stated he only used 1 day.  Has another decongestant nose spray and mucinex nose spray at home also.  Discussed to check active ingredient list to ensure not taking more than 1 manufacturer of same drug.  Discussed cough with flu has been typically lasting 2 weeks as seen in clinic the past month.  Discussed mucous clear to yellow to green than done.  Discussed bromphed DM 5ml po q6h prn rhinitis/cough may cause drowsiness/insomnia due to sudafed #120 RF0 electronic Rx sent to his pharmacy of choice.  Honey 1 tablespoon po q4h prn cough   Wear scarf or mask over nose/mouth when outside in freezing temperatures as may trigger bronchospasm e.g. cold air.  Shower BID prn congestion.  Drink water to keep urine pale yellow clear and voiding every 2-4 hours while awake.  Avoid dehydration.  Consider running humidifier in bedroom  Discussed post nasal drip triggering cough.  BBS CTA and sp02 100%.  Call or return to clinic as needed if these symptoms worsen or fail to improve as anticipated e.g. opaque tan mucous foul tasting, vomiting after coughing, wheezing/dyspnea, fever/chills.   Exitcare handout on influenza/cough.  Patient verbalized understanding of instructions, agreed with plan of care and had no further questions at this time.  P2:  Avoidance and hand washing.

## 2023-04-12 NOTE — Patient Instructions (Signed)
How to Perform a Sinus Rinse A sinus rinse is a home treatment that is used to rinse your sinuses with a germ-free (sterile) mixture of salt and water (saline solution). Sinuses are air-filled spaces in your skull that are behind the bones of your face and forehead. They open into your nasal cavity. A sinus rinse can help to clear mucus, dirt, dust, or pollen from your nasal cavity. You may do a sinus rinse when you have a cold, a virus, nasal allergy symptoms, a sinus infection, or stuffiness in your nose or sinuses. What are the risks? A sinus rinse is generally safe and effective. However, there are a few risks, which include: A burning sensation in your sinuses. This may happen if you do not make the saline solution as directed. Be sure to follow all directions when making the saline solution. Nasal irritation. Infection. This may be from unclean supplies or from contaminated water. Infection from contaminated water is rare, but possible. Do not do a sinus rinse if you have had ear or nasal surgery, ear infection, or plugged ears, unless recommended by your health care provider. Supplies needed: Saline solution or powder. Distilled or sterile water to mix with saline powder. You may use boiled and cooled tap water. Boil tap water for 5 minutes; cool until it is lukewarm. Use within 24 hours. Do not use regular tap water to mix with the saline solution. Neti pot or nasal rinse bottle. These supplies release the saline solution into your nose and through your sinuses. Neti pots and nasal rinse bottles can be purchased at [employer], a health food store, or online. How to perform a sinus rinse  Wash your hands with soap and water for at least 20 seconds. If soap and water are not available, use hand sanitizer. Wash your device according to the directions that came with the product and then dry it. Use the solution that comes with your product or one that is sold separately in stores.  Follow the mixing directions on the package to mix with sterile or distilled water. Fill the device with the amount of saline solution noted in the device instructions. Stand by a sink and tilt your head sideways over the sink. Place the spout of the device in your upper nostril (the one closer to the ceiling). Gently pour or squeeze the saline solution into your nasal cavity. The liquid should drain out from the lower nostril if you are not too congested. While rinsing, breathe through your open mouth. Gently blow your nose to clear any mucus and rinse solution. Blowing too hard may cause ear pain. Turn your head in the other direction and repeat in your other nostril. Clean and rinse your device with clean water and then air-dry it. Talk with your health care provider or pharmacist if you have questions about how to do a sinus rinse. Summary A sinus rinse is a home treatment that is used to rinse your sinuses with a sterile mixture of salt and water (saline solution). You may do a sinus rinse when you have a cold, a virus, nasal allergy symptoms, a sinus infection, or stuffiness in your nose or sinuses. A sinus rinse is generally safe and effective. Follow all instructions carefully. This information is not intended to replace advice given to you by your health care provider. Make sure you discuss any questions you have with your health care provider. Document Revised: 08/30/2020 Document Reviewed: 08/30/2020 Elsevier Patient Education  2024 [employer]Influenza, Adult  Influenza is also called the flu. It's an infection that affects your respiratory tract. This includes your nose, throat, windpipe, and lungs. The flu is contagious. This means it spreads easily from person to person. It causes symptoms that are like a cold. It can also cause a high fever and body aches. What are the causes? The flu is caused by the influenza virus. You can get it by: Breathing in droplets that are in the air  after an infected person coughs or sneezes. Touching something that has the virus on it and then touching your mouth, nose, or eyes. What increases the risk? You may be more likely to get the flu if: You don't wash your hands often. You're near a lot of people during cold and flu season. You touch your mouth, eyes, or nose without washing your hands first. You don't get a flu shot each year. You may also be more at risk for the flu and serious problems, such as a lung infection called pneumonia, if: You're older than 65. You're pregnant. Your immune system is weak. Your immune system is your body's defense system. You have a long-term, or chronic, condition, such as: Heart, kidney, or lung disease. Diabetes. A liver disorder. Asthma. You're very overweight. You have anemia. This is when you don't have enough red blood cells in your body. What are the signs or symptoms? Flu symptoms often start all of a sudden. They may last 4-14 days and include: Fever and chills. Headaches, body aches, or muscle aches. Sore throat. Cough. Runny or stuffy nose. Discomfort in your chest. Not wanting to eat as much as normal. Feeling weak or tired. Feeling dizzy. Nausea or vomiting. How is this diagnosed? The flu may be diagnosed based on your symptoms and medical history. You may also have a physical exam. A swab may be taken from your nose or throat and tested for the virus. How is this treated? If the flu is found early, you can be treated with antiviral medicine. This may be given to you by mouth or through an IV. It can help you feel less sick and get better faster. Taking care of yourself at home can also help your symptoms get better. Your health care provider may tell you to: Take over-the-counter medicines. Drink lots of fluids. The flu often goes away on its own. If you have very bad symptoms or problems caused by the flu, you may need to be treated in a hospital. Follow these  instructions at home: Activity Rest as needed. Get lots of sleep. Stay home from work or school as told by your provider. Leave home only to go see your provider. Do not leave home for other reasons until you don't have a fever for 24 hours without taking medicine. Eating and drinking Take an oral rehydration solution (ORS). This is a drink that is sold at pharmacies and stores. Drink enough fluid to keep your pee pale yellow. Try to drink small amounts of clear fluids. These include water, ice chips, fruit juice mixed with water, and low-calorie sports drinks. Try to eat bland foods that are easy to digest. These include bananas, applesauce, rice, lean meats, toast, and crackers. Avoid drinks that have a lot of sugar or caffeine in them. These include energy drinks, regular sports drinks, and soda. Do not drink alcohol. Do not eat spicy or fatty foods. General instructions     Take your medicines only as told by your provider. Use a cool mist humidifier to  add moisture to the air in your home. This can make it easier for you to breathe. You should also clean the humidifier every day. To do so: Empty the water. Pour clean water in. Cover your mouth and nose when you cough or sneeze. Wash your hands with soap and water often and for at least 20 seconds. It's extra important to do so after you cough or sneeze. If you can't use soap and water, use hand sanitizer. How is this prevented?  Get a flu shot every year. Ask your provider when you should get your flu shot. Stay away from people who are sick during fall and winter. Fall and winter are cold and flu season. Contact a health care provider if: You get new symptoms. You have chest pain. You have watery poop, also called diarrhea. You have a fever. Your cough gets worse. You start to have more mucus. You feel like you may vomit, or you vomit. Get help right away if: You become short of breath or have trouble breathing. Your skin  or nails turn blue. You have very bad pain or stiffness in your neck. You get a sudden headache or pain in your face or ear. You vomit each time you eat or drink. These symptoms may be an emergency. Call 911 right away. Do not wait to see if the symptoms will go away. Do not drive yourself to the hospital. This information is not intended to replace advice given to you by your health care provider. Make sure you discuss any questions you have with your health care provider. Document Revised: 12/14/2022 Document Reviewed: 04/20/2022 Elsevier Patient Education  2024 [employer].Cough, Adult Coughing is a reflex that clears your throat and airways (respiratory system). It helps heal and protect your lungs. It is normal to cough from time to time. A cough that happens with other symptoms or that lasts a long time may be a sign of a condition that needs treatment. A short-term (acute) cough may only last 2-3 weeks. A long-term (chronic) cough may last 8 or more weeks. Coughing is often caused by: Diseases, such as: An infection of the respiratory system. Asthma or other heart or lung diseases. Gastroesophageal reflux. This is when acid comes back up from the stomach. Breathing in things that irritate your lungs. Allergies. Postnasal drip. This is when mucus runs down the back of your throat. Smoking. Some medicines. Follow these instructions at home: Medicines Take over-the-counter and prescription medicines only as told by your health care provider. Talk with your provider before you take cough medicine (cough suppressants). Eating and drinking Do not drink alcohol. Avoid caffeine. Drink enough fluid to keep your pee (urine) pale yellow. Lifestyle Avoid cigarette smoke. Do not use any products that contain nicotine or tobacco. These products include cigarettes, chewing tobacco, and vaping devices, such as e-cigarettes. If you need help quitting, ask your provider. Avoid things that make  you cough. These may include perfumes, candles, cleaning products, or campfire smoke. General instructions  Watch for any changes to your cough. Tell your provider about them. Always cover your mouth when you cough. If the air is dry in your bedroom or home, use a cool mist vaporizer or humidifier. If your cough is worse at night, try to sleep in a semi-upright position. Rest as needed. Contact a health care provider if: You have new symptoms, or your symptoms get worse. You cough up pus. You have a fever that does not go away or a cough  that does not get better after 2-3 weeks. You cannot control your cough with medicine, and you are losing sleep. You have pain that gets worse or is not helped with medicine. You lose weight for no clear reason. You have night sweats. Get help right away if: You cough up blood. You have trouble breathing. Your heart is beating very fast. These symptoms may be an emergency. Get help right away. Call 911. Do not wait to see if the symptoms will go away. Do not drive yourself to the hospital. This information is not intended to replace advice given to you by your health care provider. Make sure you discuss any questions you have with your health care provider. Document Revised: 11/11/2021 Document Reviewed: 11/11/2021 Elsevier Patient Education  2024 [employer].

## 2023-04-20 ENCOUNTER — Other Ambulatory Visit: Payer: Self-pay | Admitting: Family Medicine

## 2023-04-20 DIAGNOSIS — E781 Pure hyperglyceridemia: Secondary | ICD-10-CM

## 2023-05-01 ENCOUNTER — Other Ambulatory Visit: Payer: Self-pay | Admitting: Family Medicine

## 2023-06-05 ENCOUNTER — Other Ambulatory Visit: Payer: Self-pay | Admitting: Infectious Diseases

## 2023-06-05 DIAGNOSIS — B20 Human immunodeficiency virus [HIV] disease: Secondary | ICD-10-CM

## 2023-06-10 ENCOUNTER — Other Ambulatory Visit: Payer: Self-pay | Admitting: Family Medicine

## 2023-06-10 DIAGNOSIS — I10 Essential (primary) hypertension: Secondary | ICD-10-CM

## 2023-06-15 ENCOUNTER — Encounter: Payer: Self-pay | Admitting: Family Medicine

## 2023-06-15 ENCOUNTER — Other Ambulatory Visit: Payer: Self-pay | Admitting: Family Medicine

## 2023-06-15 DIAGNOSIS — I10 Essential (primary) hypertension: Secondary | ICD-10-CM

## 2023-06-15 MED ORDER — ROSUVASTATIN CALCIUM 20 MG PO TABS: 20.0000 mg | ORAL_TABLET | Freq: Every day | ORAL | 3 refills | Status: AC

## 2023-07-04 ENCOUNTER — Other Ambulatory Visit (INDEPENDENT_AMBULATORY_CARE_PROVIDER_SITE_OTHER)

## 2023-07-04 ENCOUNTER — Other Ambulatory Visit (HOSPITAL_COMMUNITY)
Admission: RE | Admit: 2023-07-04 | Discharge: 2023-07-04 | Disposition: A | Discharge status: Home / Self Care | Source: Ambulatory Visit | Attending: Internal Medicine | Admitting: Internal Medicine

## 2023-07-04 ENCOUNTER — Other Ambulatory Visit: Payer: Self-pay | Admitting: Infectious Diseases

## 2023-07-04 DIAGNOSIS — Z113 Encounter for screening for infections with a predominantly sexual mode of transmission: Secondary | ICD-10-CM | POA: Insufficient documentation

## 2023-07-04 DIAGNOSIS — B20 Human immunodeficiency virus [HIV] disease: Secondary | ICD-10-CM

## 2023-07-04 DIAGNOSIS — Z79899 Other long term (current) drug therapy: Secondary | ICD-10-CM

## 2023-07-04 DIAGNOSIS — E781 Pure hyperglyceridemia: Secondary | ICD-10-CM

## 2023-07-04 LAB — T-HELPER CELLS (CD4) COUNT (NOT AT ARMC)
CD4 % Helper T Cell: 19 % — ABNORMAL LOW (ref 33–65)
CD4 T Cell Abs: 400 /uL (ref 400–1790)

## 2023-07-04 LAB — URINE CYTOLOGY ANCILLARY ONLY
Chlamydia: NEGATIVE
Comment: NEGATIVE
Comment: NORMAL
Neisseria Gonorrhea: NEGATIVE

## 2023-07-05 LAB — CBC

## 2023-07-06 LAB — CMP14 + ANION GAP
ALT: 40 IU/L (ref 0–44)
AST: 44 IU/L — ABNORMAL HIGH (ref 0–40)
Albumin: 4.5 g/dL (ref 3.8–4.9)
Alkaline Phosphatase: 47 IU/L (ref 44–121)
Anion Gap: 16 mmol/L (ref 10.0–18.0)
BUN/Creatinine Ratio: 15 (ref 9–20)
BUN: 13 mg/dL (ref 6–24)
Bilirubin Total: 0.5 mg/dL (ref 0.0–1.2)
CO2: 23 mmol/L (ref 20–29)
Calcium: 9.7 mg/dL (ref 8.7–10.2)
Chloride: 100 mmol/L (ref 96–106)
Creatinine, Ser: 0.89 mg/dL (ref 0.76–1.27)
Globulin, Total: 2.9 g/dL (ref 1.5–4.5)
Glucose: 96 mg/dL (ref 70–99)
Potassium: 4.4 mmol/L (ref 3.5–5.2)
Sodium: 139 mmol/L (ref 134–144)
Total Protein: 7.4 g/dL (ref 6.0–8.5)
eGFR: 102 mL/min/{1.73_m2} (ref >59)

## 2023-07-06 LAB — LIPID PANEL
Chol/HDL Ratio: 4.7 ratio (ref 0.0–5.0)
Cholesterol, Total: 140 mg/dL (ref 100–199)
HDL: 30 mg/dL — ABNORMAL LOW (ref >39)
LDL Chol Calc (NIH): 88 mg/dL (ref 0–99)
Triglycerides: 119 mg/dL (ref 0–149)
VLDL Cholesterol Cal: 22 mg/dL (ref 5–40)

## 2023-07-06 LAB — CBC
Hematocrit: 48.8 % (ref 37.5–51.0)
Hemoglobin: 15.8 g/dL (ref 13.0–17.7)
MCH: 28.5 pg (ref 26.6–33.0)
MCHC: 32.4 g/dL (ref 31.5–35.7)
MCV: 88 fL (ref 79–97)
Platelets: 309 10*3/uL (ref 150–450)
RBC: 5.54 x10E6/uL (ref 4.14–5.80)
RDW: 14.3 % (ref 11.6–15.4)
WBC: 5.4 10*3/uL (ref 3.4–10.8)

## 2023-07-06 LAB — HIV-1 RNA QUANT-NO REFLEX-BLD: HIV-1 RNA Viral Load: <20 {copies}/mL

## 2023-07-06 LAB — RPR: RPR Ser Ql: NONREACTIVE

## 2023-07-18 ENCOUNTER — Ambulatory Visit: Admitting: Infectious Diseases

## 2023-07-18 ENCOUNTER — Other Ambulatory Visit: Payer: Self-pay

## 2023-07-18 VITALS — BP 147/88 | HR 72 | Temp 98.6°F | Resp 28 | Ht 71.0 in | Wt 192.2 lb

## 2023-07-18 DIAGNOSIS — I10 Essential (primary) hypertension: Secondary | ICD-10-CM

## 2023-07-18 DIAGNOSIS — E781 Pure hyperglyceridemia: Secondary | ICD-10-CM

## 2023-07-18 DIAGNOSIS — B20 Human immunodeficiency virus [HIV] disease: Secondary | ICD-10-CM

## 2023-07-18 DIAGNOSIS — K219 Gastro-esophageal reflux disease without esophagitis: Secondary | ICD-10-CM

## 2023-07-18 DIAGNOSIS — N521 Erectile dysfunction due to diseases classified elsewhere: Secondary | ICD-10-CM

## 2023-07-18 DIAGNOSIS — Z113 Encounter for screening for infections with a predominantly sexual mode of transmission: Secondary | ICD-10-CM

## 2023-07-18 NOTE — Assessment & Plan Note (Signed)
 He continues on statin, fibrate

## 2023-07-18 NOTE — Progress Notes (Signed)
 Subjective:    Patient ID: John Farrell, male  DOB: 1970-09-02, 53 y.o.        MRN: [id number]   HPI 53 yo M with hx HIV+ 1999, mild hyperlipidemia, HTN, depression.   Married HIV- partner on prep. 04-2018 (atripla --> biktarvy ).    On statin for hyperlipidemia. 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 He is to have 8 week f/u appt, discussion of surgical options.    No problems with ART- Now getting at [facility]. [facility] is "phenomenal!" Has not gotten shingles vax yet... Getting colon this year.    HIV 1 RNA Quant  Date Value  04/08/2021 Not Detected Copies/mL  06/22/2020 Not Detected Copies/mL  06/20/2019 <20 NOT DETECTED copies/mL   HIV-1 RNA Viral Load (copies/mL)  Date Value  07/04/2023 <20  01/10/2022 <20   CD4 T Cell Abs (/uL)  Date Value  07/04/2023 400  01/10/2022 393 (L)  06/22/2020 370 (L)   Lab Results  Component Value Date   CHOL 140 07/04/2023   HDL 30 (L) 07/04/2023   LDLCALC 88 07/04/2023   LDLDIRECT 117.0 02/22/2017   TRIG 119 07/04/2023   CHOLHDL 4.7 07/04/2023    Health Maintenance  Topic Date Due  . Zoster Vaccines- Shingrix (1 of 2) Never done  . Colonoscopy  03/27/2022  . Pneumococcal Vaccine 31-73 Years old (4 of 4 - PCV20 or PCV21) 08/15/2022  . COVID-19 Vaccine (7 - Pfizer risk 2024-25 season) 07/19/2023  . INFLUENZA VACCINE  10/26/2023  . DTaP/Tdap/Td (3 - Td or Tdap) 08/17/2026  . Hepatitis C Screening  Completed  . HIV Screening  Completed  . HPV VACCINES  Aged Out  . Meningococcal B Vaccine  Aged Out   Has been exercising, no change in diet.   Review of Systems  Constitutional:  Negative for chills, fever and weight loss.  Respiratory:  Negative for cough and shortness of breath.   Cardiovascular:  Negative for chest pain and leg swelling.  Gastrointestinal:  Negative for constipation and diarrhea.  Genitourinary:  Negative for dysuria.   Neurological:  Negative for headaches.    Please see HPI. All other systems reviewed and negative.     Objective:  Physical Exam Vitals reviewed.  Constitutional:      Appearance: Normal appearance.  HENT:     Mouth/Throat:     Mouth: Mucous membranes are moist.     Pharynx: No oropharyngeal exudate.  Eyes:     Extraocular Movements: Extraocular movements intact.     Pupils: Pupils are equal, round, and reactive to light.  Cardiovascular:     Rate and Rhythm: Normal rate and regular rhythm.     Pulses: Normal pulses.     Heart sounds: Normal heart sounds.  Pulmonary:     Effort: Pulmonary effort is normal.     Breath sounds: Normal breath sounds.  Abdominal:     General: Bowel sounds are normal. There is no distension.     Palpations: Abdomen is soft.     Tenderness: There is no abdominal tenderness.  Musculoskeletal:        General: Normal range of motion.     Cervical back: Normal range of motion and neck supple.     Right lower leg: No edema.     Left lower leg: No edema.  Neurological:     Mental Status: He is alert.  Psychiatric:  Mood and Affect: Mood normal.          Assessment & Plan:

## 2023-07-18 NOTE — Assessment & Plan Note (Signed)
 Continues to take viagra prn

## 2023-07-18 NOTE — Assessment & Plan Note (Signed)
 Off PPI,  Asx.

## 2023-07-18 NOTE — Assessment & Plan Note (Addendum)
 Needs better control, on ACE-I Consider increase dose Apprecaite PCP f/u

## 2023-07-18 NOTE — Assessment & Plan Note (Signed)
 Doing great! Continues on biktarvy  Partner on prep Colon this year Shingles vax this year.

## 2023-08-15 ENCOUNTER — Ambulatory Visit

## 2023-08-15 VITALS — BP 146/79 | Ht 70.0 in | Wt 190.0 lb

## 2023-08-15 DIAGNOSIS — Z0000 Encounter for general adult medical examination without abnormal findings: Secondary | ICD-10-CM

## 2023-08-15 NOTE — Progress Notes (Signed)
 Be Well forms signed

## 2023-08-16 ENCOUNTER — Encounter: Payer: Self-pay | Admitting: Registered Nurse

## 2023-08-16 ENCOUNTER — Telehealth: Payer: Self-pay | Admitting: Registered Nurse

## 2023-08-16 DIAGNOSIS — I10 Essential (primary) hypertension: Secondary | ICD-10-CM

## 2023-08-16 DIAGNOSIS — Z0000 Encounter for general adult medical examination without abnormal findings: Secondary | ICD-10-CM

## 2023-08-16 NOTE — Telephone Encounter (Signed)
 Patient saw RN Thersia Flax in clinic signed Be Well 2026 ROI and tobacco attestation negative smoke/vape in previous 12 months.  Had appt with [facility] 07/18/23 for cholesterol, hypertension and labs performed  No Hgba1c performed   Latest Reference Range & Units 07/04/23 09:38  Sodium 134 - 144 mmol/L 139  Potassium 3.5 - 5.2 mmol/L 4.4  Chloride 96 - 106 mmol/L 100  CO2 20 - 29 mmol/L 23  Glucose 70 - 99 mg/dL 96  BUN 6 - 24 mg/dL 13  Creatinine 7.84 - 6.96 mg/dL 2.95  Calcium  8.7 - 10.2 mg/dL 9.7  Anion gap 28.4 - 13.2 mmol/L 16.0  BUN/Creatinine Ratio 9 - 20  15  eGFR >59 mL/min/1.73 102  Alkaline Phosphatase 44 - 121 IU/L 47  Albumin 3.8 - 4.9 g/dL 4.5  AST 0 - 40 IU/L 44 (H)  ALT 0 - 44 IU/L 40  Total Protein 6.0 - 8.5 g/dL 7.4  Total Bilirubin 0.0 - 1.2 mg/dL 0.5  Total CHOL/HDL Ratio 0.0 - 5.0 ratio 4.7  Cholesterol, Total 100 - 199 mg/dL 440  HDL Cholesterol >10 mg/dL 30 (L)  Triglycerides 0 - 149 mg/dL 272  VLDL Cholesterol Cal 5 - 40 mg/dL 22  LDL Chol Calc (NIH) 0 - 99 mg/dL 88  Globulin, Total 1.5 - 4.5 g/dL 2.9  WBC 3.4 - 53.6 U44I3/KV 5.4  RBC 4.14 - 5.80 x10E6/uL 5.54  Hemoglobin 13.0 - 17.7 g/dL 42.5  HCT 95.6 - 38.7 % 48.8  MCV 79 - 97 fL 88  MCH 26.6 - 33.0 pg 28.5  MCHC 31.5 - 35.7 g/dL 56.4  RDW 33.2 - 95.1 % 14.3  Platelets 150 - 450 x10E3/uL 309  (H): Data is abnormally high (L): Data is abnormally low  BP greater than 135/85 PCM reported going to adjust at next visit if still elevated 147/88  Patient may have nonfasting Hgba1c with RN Thersia Flax prior to 25 Oct 2023 as executive panel completed with PCM.  Alternative Be Well forms signed completed with PCM visit.  [employer] form completed 08/16/23 and given to HR Tonya met requirements for insurance discount starting 26 Dec 2023  Patient signed HIPAA form since labs done in another office see paper chart at [facility].

## 2023-08-23 ENCOUNTER — Ambulatory Visit

## 2023-08-23 VITALS — BP 136/79

## 2023-08-23 DIAGNOSIS — I10 Essential (primary) hypertension: Secondary | ICD-10-CM

## 2023-08-23 NOTE — Progress Notes (Unsigned)
 BP check

## 2023-08-28 ENCOUNTER — Other Ambulatory Visit: Payer: Self-pay | Admitting: Infectious Diseases

## 2023-08-28 DIAGNOSIS — B20 Human immunodeficiency virus [HIV] disease: Secondary | ICD-10-CM

## 2023-08-29 ENCOUNTER — Other Ambulatory Visit: Payer: Self-pay

## 2023-08-29 DIAGNOSIS — B20 Human immunodeficiency virus [HIV] disease: Secondary | ICD-10-CM

## 2023-08-30 ENCOUNTER — Ambulatory Visit: Admitting: Registered Nurse

## 2023-08-30 ENCOUNTER — Encounter: Payer: Self-pay | Admitting: Registered Nurse

## 2023-08-30 VITALS — BP 147/79 | HR 72 | Temp 95.8°F

## 2023-08-30 DIAGNOSIS — J019 Acute sinusitis, unspecified: Secondary | ICD-10-CM

## 2023-08-30 MED ORDER — AMOXICILLIN-POT CLAVULANATE 875-125 MG PO TABS: 1.0000 | ORAL_TABLET | Freq: Two times a day (BID) | ORAL | 0 refills | Status: AC

## 2023-08-30 MED ORDER — SALINE SPRAY 0.65 % NA SOLN: 2.0000 | NASAL | Status: AC

## 2023-08-30 MED ORDER — AMOXICILLIN-POT CLAVULANATE 875-125 MG PO TABS: 1.0000 | ORAL_TABLET | Freq: Two times a day (BID) | ORAL | Status: DC

## 2023-08-30 NOTE — Progress Notes (Signed)
 C/O sinus congestion x1 week .

## 2023-08-30 NOTE — Progress Notes (Signed)
 Established Patient Office Visit  Subjective   Patient ID: John Farrell, male    DOB: 1970/10/21  Age: 53 y.o. MRN: [id number]  Chief Complaint  Patient presents with   rhinitis clear x 1 week    52y/o established caucasian male with 8 days runny nose yellow to green mucous typically clear occasional cloudy streaks.  Denied fever/chills/n/v/d/epistaxis/sinus pain/pressure/wheezing  Using nasal saline, Claritin  and flonase  nasal hasn't slowed down the drip  Home covid tests negative x 2.  Vacation in [place]  over the holiday weekend     Review of Systems  Constitutional:  Negative for chills, diaphoresis and fever.  HENT:  Positive for congestion. Negative for ear discharge, ear pain, nosebleeds, sinus pain and sore throat.   Eyes:  Negative for pain, discharge and redness.  Respiratory:  Negative for cough, hemoptysis, sputum production, shortness of breath, wheezing and stridor.   Cardiovascular:  Negative for chest pain.  Gastrointestinal:  Negative for diarrhea and vomiting.  Musculoskeletal:  Negative for myalgias.  Skin:  Negative for itching and rash.  Neurological:  Negative for dizziness and headaches.  Psychiatric/Behavioral:  The patient does not have insomnia.       Objective:     BP (!) 147/79 (BP Location: Left Arm, Patient Position: Sitting)   Pulse 72   Temp (!) 95.8 F (35.4 C) (Tympanic)   SpO2 98%    Physical Exam Vitals and nursing note reviewed.  Constitutional:      General: He is awake. He is not in acute distress.    Appearance: Normal appearance. He is well-developed, well-groomed and normal weight. He is not ill-appearing, toxic-appearing or diaphoretic.  HENT:     Head: Normocephalic and atraumatic.     Jaw: There is normal jaw occlusion.     Salivary Glands: Right salivary gland is not diffusely enlarged or tender. Left salivary gland is not diffusely enlarged or tender.     Right Ear: Hearing and external ear normal. No decreased  hearing noted. No laceration, drainage, swelling or tenderness. A middle ear effusion is present. There is no impacted cerumen. No foreign body. No mastoid tenderness. No PE tube. No hemotympanum. Tympanic membrane is not injected, scarred, perforated, erythematous, retracted or bulging.     Left Ear: Hearing and external ear normal. No decreased hearing noted. No laceration, drainage, swelling or tenderness. A middle ear effusion is present. There is no impacted cerumen. No foreign body. No mastoid tenderness. No PE tube. No hemotympanum. Tympanic membrane is not injected, scarred, perforated, erythematous, retracted or bulging.     Ears:     Comments: Bilateral TMs intact air fluid level clear; no debris noted auditory canal; gold cerumen soft 6 oclock air fluid level clear    Nose: Nose normal. No congestion or rhinorrhea.     Right Nostril: No epistaxis.     Left Nostril: No epistaxis.     Right Turbinates: Enlarged and swollen. Not pale.     Left Turbinates: Enlarged and swollen. Not pale.     Right Sinus: No maxillary sinus tenderness or frontal sinus tenderness.     Left Sinus: No maxillary sinus tenderness or frontal sinus tenderness.     Mouth/Throat:     Lips: Pink. No lesions.     Mouth: Mucous membranes are moist. No oral lesions or angioedema.     Dentition: No gum lesions.     Tongue: No lesions. Tongue does not deviate from midline.     Palate: No mass and  lesions.     Pharynx: Uvula midline. Pharyngeal swelling and postnasal drip present. No oropharyngeal exudate, posterior oropharyngeal erythema or uvula swelling.     Tonsils: No tonsillar exudate or tonsillar abscesses.     Comments: Cobblestoning posterior pharynx; nasal sniffing observed in exam room; bilateral allergic shiners Eyes:     General: Lids are normal. Vision grossly intact. Gaze aligned appropriately. Allergic shiner present. No scleral icterus.       Right eye: No discharge.        Left eye: No discharge.      Extraocular Movements: Extraocular movements intact.     Conjunctiva/sclera: Conjunctivae normal.     Pupils: Pupils are equal, round, and reactive to light.  Neck:     Trachea: Trachea and phonation normal.  Cardiovascular:     Rate and Rhythm: Normal rate and regular rhythm.     Pulses: Normal pulses.          Radial pulses are 2+ on the right side and 2+ on the left side.     Heart sounds: Normal heart sounds, S1 normal and S2 normal.  Pulmonary:     Effort: Pulmonary effort is normal. No respiratory distress.     Breath sounds: Normal breath sounds and air entry. No stridor or transmitted upper airway sounds. No decreased breath sounds, wheezing, rhonchi or rales.     Comments: Spoke full sentences without difficulty; no cough observed in exam room Abdominal:     General: Abdomen is flat.     Palpations: Abdomen is soft.  Musculoskeletal:        General: Normal range of motion.     Right hand: Normal strength. Normal capillary refill.     Left hand: Normal strength. Normal capillary refill.     Cervical back: Normal range of motion and neck supple. No swelling, edema, deformity, erythema, signs of trauma, lacerations, rigidity, spasms, torticollis, tenderness or crepitus. No pain with movement or muscular tenderness. Normal range of motion.     Thoracic back: No swelling, edema, deformity, signs of trauma, lacerations, spasms or tenderness. Normal range of motion. No scoliosis.     Right lower leg: No edema.     Left lower leg: No edema.  Lymphadenopathy:     Head:     Right side of head: No submental, submandibular, tonsillar, preauricular, posterior auricular or occipital adenopathy.     Left side of head: No submental, submandibular, tonsillar, preauricular, posterior auricular or occipital adenopathy.     Cervical: No cervical adenopathy.     Right cervical: No superficial, deep or posterior cervical adenopathy.    Left cervical: No superficial, deep or posterior cervical  adenopathy.  Skin:    General: Skin is warm and dry.     Capillary Refill: Capillary refill takes less than 2 seconds.     Coloration: Skin is not ashen, cyanotic, jaundiced, mottled, pale or sallow.     Findings: No abrasion, abscess, acne, bruising, burn, ecchymosis, erythema, signs of injury, laceration, lesion, petechiae, rash or wound.     Comments: Visually inspected anterior face/neck/hands  Neurological:     General: No focal deficit present.     Mental Status: He is alert and oriented to person, place, and time. Mental status is at baseline.     GCS: GCS eye subscore is 4. GCS verbal subscore is 5. GCS motor subscore is 6.     Cranial Nerves: No cranial nerve deficit, dysarthria or facial asymmetry.     Motor:  Motor function is intact. No weakness, tremor, atrophy, abnormal muscle tone or seizure activity.     Coordination: Coordination is intact. Coordination normal.     Gait: Gait is intact. Gait normal.     Comments: In/out of chair and on/off exam table without difficulty; gait sure and steady in clinic; bilateral hand grasp equal 5/5  Psychiatric:        Attention and Perception: Attention and perception normal.        Mood and Affect: Mood and affect normal.        Speech: Speech normal.        Behavior: Behavior normal. Behavior is cooperative.        Thought Content: Thought content normal.        Cognition and Memory: Cognition and memory normal.        Judgment: Judgment normal.      No results found for any visits on 08/30/23.    The 10-year ASCVD risk score (Arnett DK, et al., 2019) is: 7.2%    Assessment & Plan:   Problem List Items Addressed This Visit   None Visit Diagnoses       Acute rhinosinusitis    -  Primary   Relevant Medications   amoxicillin -clavulanate (AUGMENTIN ) 875-125 MG tablet      Discussed symptoms consistent with viral sinusitis at this time mucous clear to yellow to green then done typically lasts 7-14 days.  Drink water to  stay hydrated as may lose an extra liter per day with mucous production/mouth breathing.  Continue use normal saline nasal spray 2 sprays each nostril q2h wa as needed. flonase  50mcg 1 spray each nostril BID.  Patient denied personal or family history of ENT cancer.  OTC antihistamine of choice claritin /zyrtec 10mg  po daily.  May add sudafed 30mg  po q4-6h prn rhinitis/congestion discussed common side effects drowsiness, insomnia, increased BP/HR.  Avoid driving and drinking alcohol after taking sudafed and have to show drivers license at pharmacy to purchase.  Max 8 tabs/240mg  per 24 hours.  Start augmentin  875mg  po BID x 7 days #20 RF0 if completely opaque mucous, tan/bloody mucous, fever, chills, sinus pain starts this weekend.  Patient on Biktarvy  and HIV disease.  Avoid triggers if possible.  Shower prior to bedtime if exposed to triggers.  If allergic dust/dust mites recommend mattress/pillow covers/encasements; washing linens, vacuuming, sweeping, dusting weekly.  Call or return to clinic as needed if these symptoms worsen or fail to improve as anticipated.   Exitcare handout on viral URI and sinus rinse given to patient.  Patient verbalized understanding of instructions, agreed with plan of care and had no further questions at this time.  P2:  Avoidance and hand washing.  No follow-ups on file.    Richardine Chancy, NP

## 2023-08-30 NOTE — Patient Instructions (Signed)
 Sinus Infection, Adult A sinus infection, also called sinusitis, is inflammation of your sinuses. Sinuses are hollow spaces in the bones around your face. Your sinuses are located: Around your eyes. In the middle of your forehead. Behind your nose. In your cheekbones. Mucus normally drains out of your sinuses. When your nasal tissues become inflamed or swollen, mucus can become trapped or blocked. This allows bacteria, viruses, and fungi to grow, which leads to infection. Most infections of the sinuses are caused by a virus. A sinus infection can develop quickly. It can last for up to 4 weeks (acute) or for more than 12 weeks (chronic). A sinus infection often develops after a cold. What are the causes? This condition is caused by anything that creates swelling in the sinuses or stops mucus from draining. This includes: Allergies. Asthma. Infection from bacteria or viruses. Deformities or blockages in your nose or sinuses. Abnormal growths in the nose (nasal polyps). Pollutants, such as chemicals or irritants in the air. Infection from fungi. This is rare. What increases the risk? You are more likely to develop this condition if you: Have a weak body defense system (immune system). Do a lot of swimming or diving. Overuse nasal sprays. Smoke. What are the signs or symptoms? The main symptoms of this condition are pain and a feeling of pressure around the affected sinuses. Other symptoms include: Stuffy nose or congestion that makes it difficult to breathe through your nose. Thick yellow or greenish drainage from your nose. Tenderness, swelling, and warmth over the affected sinuses. A cough that may get worse at night. Decreased sense of smell and taste. Extra mucus that collects in the throat or the back of the nose (postnasal drip) causing a sore throat or bad breath. Tiredness (fatigue). Fever. How is this diagnosed? This condition is diagnosed based on: Your symptoms. Your  medical history. A physical exam. Tests to find out if your condition is acute or chronic. This may include: Checking your nose for nasal polyps. Viewing your sinuses using a device that has a light (endoscope). Testing for allergies or bacteria. Imaging tests, such as an MRI or CT scan. In rare cases, a bone biopsy may be done to rule out more serious types of fungal sinus disease. How is this treated? Treatment for a sinus infection depends on the cause and whether your condition is chronic or acute. If caused by a virus, your symptoms should go away on their own within 10 days. You may be given medicines to relieve symptoms. They include: Medicines that shrink swollen nasal passages (decongestants). A spray that eases inflammation of the nostrils (topical intranasal corticosteroids). Rinses that help get rid of thick mucus in your nose (nasal saline washes). Medicines that treat allergies (antihistamines). Over-the-counter pain relievers. If caused by bacteria, your health care provider may recommend waiting to see if your symptoms improve. Most bacterial infections will get better without antibiotic medicine. You may be given antibiotics if you have: A severe infection. A weak immune system. If caused by narrow nasal passages or nasal polyps, surgery may be needed. Follow these instructions at home: Medicines Take, use, or apply over-the-counter and prescription medicines only as told by your health care provider. These may include nasal sprays. If you were prescribed an antibiotic medicine, take it as told by your health care provider. Do not stop taking the antibiotic even if you start to feel better. Hydrate and humidify  Drink enough fluid to keep your urine pale yellow. Staying hydrated will help  to thin your mucus. Use a cool mist humidifier to keep the humidity level in your home above 50%. Inhale steam for 10-15 minutes, 3-4 times a day, or as told by your health care  provider. You can do this in the bathroom while a hot shower is running. Limit your exposure to cool or dry air. Rest Rest as much as possible. Sleep with your head raised (elevated). Make sure you get enough sleep each night. General instructions  Apply a warm, moist washcloth to your face 3-4 times a day or as told by your health care provider. This will help with discomfort. Use nasal saline washes as often as told by your health care provider. Wash your hands often with soap and water to reduce your exposure to germs. If soap and water are not available, use hand sanitizer. Do not smoke. Avoid being around people who are smoking (secondhand smoke). Keep all follow-up visits. This is important. Contact a health care provider if: You have a fever. Your symptoms get worse. Your symptoms do not improve within 10 days. Get help right away if: You have a severe headache. You have persistent vomiting. You have severe pain or swelling around your face or eyes. You have vision problems. You develop confusion. Your neck is stiff. You have trouble breathing. These symptoms may be an emergency. Get help right away. Call 911. Do not wait to see if the symptoms will go away. Do not drive yourself to the hospital. Summary A sinus infection is soreness and inflammation of your sinuses. Sinuses are hollow spaces in the bones around your face. This condition is caused by nasal tissues that become inflamed or swollen. The swelling traps or blocks the flow of mucus. This allows bacteria, viruses, and fungi to grow, which leads to infection. If you were prescribed an antibiotic medicine, take it as told by your health care provider. Do not stop taking the antibiotic even if you start to feel better. Keep all follow-up visits. This is important. This information is not intended to replace advice given to you by your health care provider. Make sure you discuss any questions you have with your health  care provider. Document Revised: 02/15/2021 Document Reviewed: 02/15/2021 Elsevier Patient Education  2024 [employer] Inc.Viral Respiratory Infection A respiratory infection is an illness that affects part of the respiratory system, such as the lungs, nose, or throat. A respiratory infection that is caused by a virus is called a viral respiratory infection. Common types of viral respiratory infections include: A cold. The flu (influenza). A respiratory syncytial virus (RSV) infection. What are the causes? This condition is caused by a virus. The virus may spread through contact with droplets or direct contact with infected people or their mucus or secretions. The virus may spread from person to person (is contagious). What are the signs or symptoms? Symptoms of this condition include: A stuffy or runny nose. A sore throat or cough. Shortness of breath or difficulty breathing. Yellow or green mucus (sputum). Other symptoms may include: A fever. Sweating or chills. Fatigue. Achy muscles. A headache. How is this diagnosed? This condition may be diagnosed based on: Your symptoms. A physical exam. Testing of secretions from the nose or throat. Chest X-ray. How is this treated? This condition may be treated with medicines, such as: Antiviral medicine. This may shorten the length of time a person has symptoms. Expectorants. These make it easier to cough up mucus. Decongestant nasal sprays. Acetaminophen or NSAIDs, such as ibuprofen, to relieve  fever and pain. Antibiotic medicines are not prescribed for viral infections.This is because antibiotics are designed to kill bacteria. They do not kill viruses. Follow these instructions at home: Managing pain and congestion Take over-the-counter and prescription medicines only as told by your health care provider. If you have a sore throat, gargle with a mixture of salt and water 3-4 times a day or as needed. To make salt water, completely  dissolve -1 tsp (3-6 g) of salt in 1 cup (237 mL) of warm water. Use nose drops made from salt water to ease congestion and soften raw skin around your nose. Take 2 tsp (10 mL) of honey at bedtime to lessen coughing at night. Do not give honey to children who are younger than 1 year. Drink enough fluid to keep your urine pale yellow. This helps prevent dehydration and helps loosen up mucus. General instructions  Rest as much as possible. Do not drink alcohol. Do not use any products that contain nicotine or tobacco. These products include cigarettes, chewing tobacco, and vaping devices, such as e-cigarettes. If you need help quitting, ask your health care provider. Keep all follow-up visits. This is important. How is this prevented?     Get an annual flu shot. You may get the flu shot in late summer, fall, or winter. Ask your health care provider when you should get your flu shot. Avoid spreading your infection to other people. If you are sick: Wash your hands with soap and water often, especially after you cough or sneeze. Wash for at least 20 seconds. If soap and water are not available, use alcohol-based hand sanitizer. Cover your mouth when you cough. Cover your nose and mouth when you sneeze. Do not share cups or eating utensils. Clean commonly used objects often. Clean commonly touched surfaces. Stay home from work or school as told by your health care provider. Avoid contact with people who are sick during cold and flu season. This is generally fall and winter. Contact a health care provider if: Your symptoms last for 10 days or longer. Your symptoms get worse over time. You have severe sinus pain in your face or forehead. The glands in your jaw or neck become very swollen. You have shortness of breath. Get help right away if you: Feel pain or pressure in your chest. Have trouble breathing. Faint or feel like you will faint. Have severe and persistent vomiting. Feel confused  or disoriented. These symptoms may represent a serious problem that is an emergency. Do not wait to see if the symptoms will go away. Get medical help right away. Call your local emergency services (911 in the [place] Do not drive yourself to the hospital. Summary A respiratory infection is an illness that affects part of the respiratory system, such as the lungs, nose, or throat. A respiratory infection that is caused by a virus is called a viral respiratory infection. Common types of viral respiratory infections include a cold, influenza, and respiratory syncytial virus (RSV) infection. Symptoms of this condition include a stuffy or runny nose, cough, fatigue, achy muscles, sore throat, and fevers or chills. Antibiotic medicines are not prescribed for viral infections. This is because antibiotics are designed to kill bacteria. They are not effective against viruses. This information is not intended to replace advice given to you by your health care provider. Make sure you discuss any questions you have with your health care provider. Document Revised: 06/17/2020 Document Reviewed: 06/17/2020 Elsevier Patient Education  2024 Elsevier Inc.How to Perform  a Sinus Rinse A sinus rinse is a home treatment that is used to rinse your sinuses with a germ-free (sterile) mixture of salt and water (saline solution). Sinuses are air-filled spaces in your skull that are behind the bones of your face and forehead. They open into your nasal cavity. A sinus rinse can help to clear mucus, dirt, dust, or pollen from your nasal cavity. You may do a sinus rinse when you have a cold, a virus, nasal allergy symptoms, a sinus infection, or stuffiness in your nose or sinuses. What are the risks? A sinus rinse is generally safe and effective. However, there are a few risks, which include: A burning sensation in your sinuses. This may happen if you do not make the saline solution as directed. Be sure to follow all directions  when making the saline solution. Nasal irritation. Infection. This may be from unclean supplies or from contaminated water. Infection from contaminated water is rare, but possible. Do not do a sinus rinse if you have had ear or nasal surgery, ear infection, or plugged ears, unless recommended by your health care provider. Supplies needed: Saline solution or powder. Distilled or sterile water to mix with saline powder. You may use boiled and cooled tap water. Boil tap water for 5 minutes; cool until it is lukewarm. Use within 24 hours. Do not use regular tap water to mix with the saline solution. Neti pot or nasal rinse bottle. These supplies release the saline solution into your nose and through your sinuses. Neti pots and nasal rinse bottles can be purchased at [employer], a health food store, or online. How to perform a sinus rinse  Wash your hands with soap and water for at least 20 seconds. If soap and water are not available, use hand sanitizer. Wash your device according to the directions that came with the product and then dry it. Use the solution that comes with your product or one that is sold separately in stores. Follow the mixing directions on the package to mix with sterile or distilled water. Fill the device with the amount of saline solution noted in the device instructions. Stand by a sink and tilt your head sideways over the sink. Place the spout of the device in your upper nostril (the one closer to the ceiling). Gently pour or squeeze the saline solution into your nasal cavity. The liquid should drain out from the lower nostril if you are not too congested. While rinsing, breathe through your open mouth. Gently blow your nose to clear any mucus and rinse solution. Blowing too hard may cause ear pain. Turn your head in the other direction and repeat in your other nostril. Clean and rinse your device with clean water and then air-dry it. Talk with your health care  provider or pharmacist if you have questions about how to do a sinus rinse. Summary A sinus rinse is a home treatment that is used to rinse your sinuses with a sterile mixture of salt and water (saline solution). You may do a sinus rinse when you have a cold, a virus, nasal allergy symptoms, a sinus infection, or stuffiness in your nose or sinuses. A sinus rinse is generally safe and effective. Follow all instructions carefully. This information is not intended to replace advice given to you by your health care provider. Make sure you discuss any questions you have with your health care provider. Document Revised: 08/30/2020 Document Reviewed: 08/30/2020 Elsevier Patient Education  2024 [employer].

## 2023-09-05 ENCOUNTER — Ambulatory Visit

## 2023-09-05 VITALS — BP 148/77

## 2023-09-05 DIAGNOSIS — I10 Essential (primary) hypertension: Secondary | ICD-10-CM

## 2023-09-05 NOTE — Progress Notes (Signed)
 BP recheck

## 2023-09-07 ENCOUNTER — Encounter: Payer: Self-pay | Admitting: Infectious Diseases

## 2023-09-10 ENCOUNTER — Other Ambulatory Visit: Payer: Self-pay | Admitting: Infectious Diseases

## 2023-09-10 MED ORDER — BIKTARVY 50-200-25 MG PO TABS: 1.0000 | ORAL_TABLET | Freq: Every day | ORAL | 0 refills | Status: DC

## 2023-09-13 ENCOUNTER — Ambulatory Visit

## 2023-09-13 VITALS — BP 134/78

## 2023-09-13 DIAGNOSIS — I10 Essential (primary) hypertension: Secondary | ICD-10-CM

## 2023-09-13 NOTE — Progress Notes (Signed)
 BP check for alternatives

## 2023-09-15 ENCOUNTER — Telehealth: Payer: Self-pay | Admitting: Registered Nurse

## 2023-09-15 ENCOUNTER — Encounter: Payer: Self-pay | Admitting: Registered Nurse

## 2023-09-15 DIAGNOSIS — I10 Essential (primary) hypertension: Secondary | ICD-10-CM

## 2023-09-15 NOTE — Telephone Encounter (Signed)
 Patient had BP recheck with RN Nathanel 134/78 09/13/23 completed requirements for alternative BP for 2026 insurance discount  [facility] form and alternative paperwork completed by NP 09/13/23  [employer] given to HR [place] met requirements for insurance discount starting 26 Dec 2023

## 2023-10-23 ENCOUNTER — Other Ambulatory Visit: Payer: Self-pay | Admitting: Family Medicine

## 2023-10-23 DIAGNOSIS — E781 Pure hyperglyceridemia: Secondary | ICD-10-CM

## 2023-11-26 ENCOUNTER — Other Ambulatory Visit: Payer: Self-pay | Admitting: Family Medicine

## 2023-11-26 DIAGNOSIS — I10 Essential (primary) hypertension: Secondary | ICD-10-CM

## 2023-12-11 ENCOUNTER — Encounter: Payer: Self-pay | Admitting: *Deleted

## 2024-01-09 ENCOUNTER — Ambulatory Visit: Admitting: General Practice

## 2024-01-09 DIAGNOSIS — Z23 Encounter for immunization: Secondary | ICD-10-CM

## 2024-01-23 MED ORDER — COVID-19 MRNA VAC-TRIS(PFIZER) 30 MCG/0.3ML IM SUSY: 0.3000 mL | PREFILLED_SYRINGE | Freq: Once | INTRAMUSCULAR | 0 refills | Status: AC

## 2024-01-23 NOTE — Addendum Note (Signed)
 Addended by: BRANDY KARENE BROCKS on: 01/23/2024 04:43 PM   Modules accepted: Orders

## 2024-02-28 ENCOUNTER — Other Ambulatory Visit: Payer: Self-pay | Admitting: Infectious Diseases

## 2024-02-28 DIAGNOSIS — B20 Human immunodeficiency virus [HIV] disease: Secondary | ICD-10-CM

## 2024-04-02 ENCOUNTER — Ambulatory Visit: Admitting: Family Medicine

## 2024-04-02 ENCOUNTER — Telehealth: Admitting: Family Medicine

## 2024-04-02 ENCOUNTER — Telehealth: Payer: Self-pay | Admitting: General Practice

## 2024-04-02 ENCOUNTER — Telehealth: Payer: Self-pay

## 2024-04-02 DIAGNOSIS — U071 COVID-19: Secondary | ICD-10-CM

## 2024-04-02 MED ORDER — NIRMATRELVIR/RITONAVIR (PAXLOVID)TABLET: 3.0000 | ORAL_TABLET | Freq: Two times a day (BID) | ORAL | 0 refills | Status: AC

## 2024-04-02 NOTE — Telephone Encounter (Signed)
 Clinic RN received an email from [facility] this morning and a picture of a ++ COVID home test. Per Carole's email, his symptoms are head and sinus congestion, runny nose, temp of 99.1, denies sore throat. He is unsure of sick contacts, these s/s began last night, 1/6 pm.   RN placed a call to [place] and he reiterated the above information. His voice sounds muffled and nasal with congestion and sniffing. He has Mucinex, Nasacort and saline nasal spray at home to use. He verifies no current allergies and Dr. Frann as his primary provider. Recommended he drink water, stay hydrated with soups. He received the COVID [place] vaccine in October '25 from this clinic. RN explained his s/s may not be as worse as his prior COVID infections. Discussed a plan and he agrees for RN to call Dr. Gena office for medication.  RN placed a call to Dr. Gena office, # [phone] and spoke to [place] in the call center. RN provided the above information along with Harrol's name, DOB and contact information. Requested a clinic RN call him, triage and request a provider to prescribe medication. Harlene read back the request, obtained RN name with clinic's phone number if a callback is needed. Will follow up with Franky later today.

## 2024-04-02 NOTE — Telephone Encounter (Signed)
 Copied from CRM #[id number]. Topic: Clinical - Medication Question >> Apr 02, 2024  8:50 AM Harlene ORN wrote: Reason for CRM: Karene GLADE - Employee clinic nurse  [facility] has contacted contracts with non medical businesses in town. Patient has sent a picture of his positive Covid test to the clinic nurse. Would like to know if the PCP or nurse in the Practice can give him a call and prescribe him some paxlovid .   Phone: [phone] ext. [phone]  Called pt and schedule for 1130, Covid Positive.

## 2024-04-02 NOTE — Progress Notes (Signed)
 Chief Complaint  Patient presents with   Covid Positive    Covid Positive     [facility] here for URI complaints. We are interacting via web portal for an electronic face-to-face visit. I verified patient's ID using 2 identifiers. Patient agreed to proceed with visit via this method. Patient is at home, I am at office. Patient and I are present for visit.   Duration: 1 day  Associated symptoms: Fever (99.1 F), sinus headache, sinus congestion, sinus pain, rhinorrhea, and fatigue, coughing, nausea Denies: itchy watery eyes, ear pain, ear drainage, sore throat, wheezing, shortness of breath, myalgia, and V/D Treatment to date: pushing fluids, Dayquil Sick contacts: No Tested+ for covid.   Past Medical History:  Diagnosis Date   DEPRESSION, MILD    GERD (gastroesophageal reflux disease)    HIV infection (HCC)    Hyperlipidemia    Hypertension    HYPERTRIGLYCERIDEMIA    Prediabetes    Sinusitis    SYPHILIS    UNSPECIFIED NEURALGIA NEURITIS AND RADICULITIS     Objective No conversational dyspnea Age appropriate judgment and insight Nml affect and mood  COVID-19 - Plan: nirmatrelvir /ritonavir  (PAXLOVID ) 20 x 150 MG & 10 x 100MG  TABS  He has some leftover Tessalon Perles at home which he will take as needed for the cough.  Given history of high cholesterol, hypertension, and HIV, we will send in 5 days of Paxlovid .  Most recent GFR is 102.  We discussed current quarantine guidelines.  Continue to push fluids, practice good hand hygiene, cover mouth when coughing. F/u prn. If starting to experience worsening symptoms, shaking, or shortness of breath, seek immediate care. Pt voiced understanding and agreement to the plan.  Mabel [place], DO 04/02/2024 12:04 PM

## 2024-04-05 ENCOUNTER — Telehealth: Payer: Self-pay | Admitting: General Practice

## 2024-04-05 NOTE — Telephone Encounter (Signed)
 Call placed to [facility] to assess progress and how Paxlovid  is working with COVID. Calder reports feeling a little better every day, almost 4 days taking Paxlovid . States not running a fever, has head/sinus congestion, blowing out milky light green mucus just in the morning. No longer taking Tylenol or Advil since fever is gone. Dr. Frann told him do not take Rosuvastatin  until 3 days after Paxlovid  is finished. MD also advised Nasacort and Saline nose spray for any nasal congestion. Chay denies nausea or vomiting, no diarrhea and is able to eat small amounts of food; he has been hydrating with drinking several bottles of water daily.   RN reviewed if OTC Mucinex would benefit for sinus relief. No clear med information found if Mucinex could be taken with current regimen and Paxlovid . RN advised Brandun to continue using the Nasacort and Saline nose sprays. Also advised to start taking Advil with food as instructed on the bottle for headache/sinus pain. Using a wet warm compress on the face above the nose to the forehead can also help shrink nasal or sinus inflammation. He should also continue hydrating with liquids to help thin secretions, avoid dairy products (will thicken) and rest. [facility] opens on Monday, 1/12 at 8 am. If Angeldejesus is able to work on Monday, requested he come to the clinic for an in-person assessment. If he is unable, RN will place a call to further assess.

## 2024-04-07 ENCOUNTER — Telehealth: Payer: Self-pay | Admitting: Registered Nurse

## 2024-04-07 ENCOUNTER — Encounter: Payer: Self-pay | Admitting: Registered Nurse

## 2024-04-07 DIAGNOSIS — U071 COVID-19: Secondary | ICD-10-CM

## 2024-04-07 DIAGNOSIS — J069 Acute upper respiratory infection, unspecified: Secondary | ICD-10-CM

## 2024-04-07 NOTE — Telephone Encounter (Signed)
 Patient reported feeling better still minimal cough but having metallic/bad taste since starting paxlovid .  Mucous usually yellow in am then turns to green from nose.  Sinus pressure unilateral this weekend increased nasal saline use has helped to decrease symptoms.  Fluticasone  nasal 1 spray each nostril BID to help with congestion and rhinitis.  Denied fever/chills/n/v/d/headache/chunky brown/bloody/foul tasting mucous.  Ate normal diet today and was up cooking meals and walking around home.  Feels ready to return to work tomorrow.  Discussed if wakes up with new or worsening to stay home and use normal call out procedures.  Symptoms improving therefore cleared to return to work tomorrow 04/07/24.  Patient instructed contact HR team member if weather inclement unable to eat outside tables or in car for private room to eat inside. Continue plan of care as previously discussed  A&Ox3 spoke full sentences without difficulty audible nasal congestion but no cough/throat clearing or wheezing audible during 8 minute call  HR team notified cleared to return onsite 04/08/24 with mask until cough/sneezing resolved.  Patient agreed with plan of care and had no further questions at this time.

## 2024-04-18 NOTE — Telephone Encounter (Signed)
 Patient reported all symptoms resolved and feeling normal denied questions or concerns.

## 2024-04-21 ENCOUNTER — Other Ambulatory Visit: Payer: Self-pay | Admitting: Family Medicine

## 2024-04-21 DIAGNOSIS — E781 Pure hyperglyceridemia: Secondary | ICD-10-CM

## 2024-04-21 NOTE — Telephone Encounter (Signed)
 Sched for a CPE at his convenience. 90 d sent. Thx.
